# Patient Record
Sex: Female | Born: 1940 | Race: White | Hispanic: No | Marital: Married | Smoking: Former smoker
Health system: Southern US, Community
[De-identification: ages and names within clinical notes are randomized; demographics above are authoritative.]

## PROBLEM LIST (undated history)

## (undated) DIAGNOSIS — D649 Anemia, unspecified: Secondary | ICD-10-CM

## (undated) DIAGNOSIS — E079 Disorder of thyroid, unspecified: Secondary | ICD-10-CM

## (undated) DIAGNOSIS — D638 Anemia in other chronic diseases classified elsewhere: Secondary | ICD-10-CM

## (undated) DIAGNOSIS — T7840XA Allergy, unspecified, initial encounter: Secondary | ICD-10-CM

## (undated) DIAGNOSIS — F0390 Unspecified dementia without behavioral disturbance: Secondary | ICD-10-CM

## (undated) DIAGNOSIS — I639 Cerebral infarction, unspecified: Secondary | ICD-10-CM

## (undated) DIAGNOSIS — K219 Gastro-esophageal reflux disease without esophagitis: Secondary | ICD-10-CM

## (undated) DIAGNOSIS — E871 Hypo-osmolality and hyponatremia: Secondary | ICD-10-CM

## (undated) DIAGNOSIS — I70209 Unspecified atherosclerosis of native arteries of extremities, unspecified extremity: Secondary | ICD-10-CM

## (undated) DIAGNOSIS — I10 Essential (primary) hypertension: Secondary | ICD-10-CM

## (undated) DIAGNOSIS — F0151 Vascular dementia with behavioral disturbance: Secondary | ICD-10-CM

## (undated) DIAGNOSIS — F319 Bipolar disorder, unspecified: Secondary | ICD-10-CM

## (undated) DIAGNOSIS — L98499 Non-pressure chronic ulcer of skin of other sites with unspecified severity: Secondary | ICD-10-CM

## (undated) HISTORY — DX: Cerebral infarction, unspecified: I63.9

## (undated) HISTORY — DX: Hypo-osmolality and hyponatremia: E87.1

## (undated) HISTORY — DX: Vascular dementia with behavioral disturbance: F01.51

## (undated) HISTORY — DX: Bipolar disorder, unspecified: F31.9

## (undated) HISTORY — DX: Allergy, unspecified, initial encounter: T78.40XA

## (undated) HISTORY — PX: FRACTURE SURGERY: SHX138

## (undated) HISTORY — DX: Non-pressure chronic ulcer of skin of other sites with unspecified severity: L98.499

## (undated) HISTORY — DX: Disorder of thyroid, unspecified: E07.9

## (undated) HISTORY — PX: APPENDECTOMY: SHX54

## (undated) HISTORY — DX: Gastro-esophageal reflux disease without esophagitis: K21.9

## (undated) HISTORY — DX: Unspecified dementia, unspecified severity, without behavioral disturbance, psychotic disturbance, mood disturbance, and anxiety: F03.90

## (undated) HISTORY — DX: Anemia, unspecified: D64.9

## (undated) HISTORY — DX: Unspecified atherosclerosis of native arteries of extremities, unspecified extremity: I70.209

## (undated) HISTORY — DX: Anemia in other chronic diseases classified elsewhere: D63.8

---

## 2000-08-08 ENCOUNTER — Emergency Department (HOSPITAL_COMMUNITY): Admission: EM | Admit: 2000-08-08 | Discharge: 2000-08-08 | Payer: Self-pay | Admitting: *Deleted

## 2001-03-23 ENCOUNTER — Encounter: Payer: Self-pay | Admitting: Internal Medicine

## 2001-03-23 ENCOUNTER — Inpatient Hospital Stay (HOSPITAL_COMMUNITY): Admission: AD | Admit: 2001-03-23 | Discharge: 2001-03-31 | Payer: Self-pay | Admitting: Internal Medicine

## 2001-03-24 ENCOUNTER — Encounter: Payer: Self-pay | Admitting: Internal Medicine

## 2001-03-25 ENCOUNTER — Encounter: Payer: Self-pay | Admitting: Internal Medicine

## 2001-03-28 ENCOUNTER — Encounter: Payer: Self-pay | Admitting: Internal Medicine

## 2001-03-29 ENCOUNTER — Encounter: Payer: Self-pay | Admitting: Internal Medicine

## 2001-03-31 ENCOUNTER — Encounter (INDEPENDENT_AMBULATORY_CARE_PROVIDER_SITE_OTHER): Payer: Self-pay | Admitting: Internal Medicine

## 2002-02-22 ENCOUNTER — Inpatient Hospital Stay (HOSPITAL_COMMUNITY): Admission: EM | Admit: 2002-02-22 | Discharge: 2002-03-06 | Payer: Self-pay | Admitting: Psychiatry

## 2002-07-21 ENCOUNTER — Inpatient Hospital Stay (HOSPITAL_COMMUNITY): Admission: EM | Admit: 2002-07-21 | Discharge: 2002-08-02 | Payer: Self-pay | Admitting: Psychiatry

## 2002-12-08 ENCOUNTER — Inpatient Hospital Stay (HOSPITAL_COMMUNITY): Admission: EM | Admit: 2002-12-08 | Discharge: 2002-12-18 | Payer: Self-pay | Admitting: Psychiatry

## 2002-12-26 ENCOUNTER — Inpatient Hospital Stay (HOSPITAL_COMMUNITY): Admission: AD | Admit: 2002-12-26 | Discharge: 2003-01-04 | Payer: Self-pay | Admitting: Psychiatry

## 2003-03-10 ENCOUNTER — Inpatient Hospital Stay (HOSPITAL_COMMUNITY): Admission: EM | Admit: 2003-03-10 | Discharge: 2003-03-21 | Payer: Self-pay | Admitting: Psychiatry

## 2003-05-10 ENCOUNTER — Inpatient Hospital Stay (HOSPITAL_COMMUNITY): Admission: EM | Admit: 2003-05-10 | Discharge: 2003-05-21 | Payer: Self-pay | Admitting: Psychiatry

## 2003-06-18 ENCOUNTER — Emergency Department (HOSPITAL_COMMUNITY): Admission: EM | Admit: 2003-06-18 | Discharge: 2003-06-19 | Payer: Self-pay | Admitting: *Deleted

## 2003-08-18 ENCOUNTER — Inpatient Hospital Stay (HOSPITAL_COMMUNITY): Admission: EM | Admit: 2003-08-18 | Discharge: 2003-08-27 | Payer: Self-pay | Admitting: *Deleted

## 2003-09-25 ENCOUNTER — Emergency Department (HOSPITAL_COMMUNITY): Admission: EM | Admit: 2003-09-25 | Discharge: 2003-09-26 | Payer: Self-pay | Admitting: Emergency Medicine

## 2004-10-21 ENCOUNTER — Ambulatory Visit: Payer: Self-pay | Admitting: Family Medicine

## 2004-12-18 ENCOUNTER — Ambulatory Visit: Payer: Self-pay | Admitting: Family Medicine

## 2004-12-19 ENCOUNTER — Ambulatory Visit (HOSPITAL_COMMUNITY): Admission: RE | Admit: 2004-12-19 | Discharge: 2004-12-19 | Payer: Self-pay | Admitting: Family Medicine

## 2005-01-27 ENCOUNTER — Emergency Department (HOSPITAL_COMMUNITY): Admission: EM | Admit: 2005-01-27 | Discharge: 2005-01-27 | Payer: Self-pay | Admitting: Emergency Medicine

## 2005-04-08 ENCOUNTER — Ambulatory Visit: Payer: Self-pay | Admitting: Family Medicine

## 2005-04-22 ENCOUNTER — Ambulatory Visit: Payer: Self-pay | Admitting: Gastroenterology

## 2005-05-01 ENCOUNTER — Ambulatory Visit (HOSPITAL_COMMUNITY): Admission: RE | Admit: 2005-05-01 | Discharge: 2005-05-01 | Payer: Self-pay | Admitting: Internal Medicine

## 2005-05-01 ENCOUNTER — Ambulatory Visit: Payer: Self-pay | Admitting: Internal Medicine

## 2005-05-04 ENCOUNTER — Ambulatory Visit: Payer: Self-pay | Admitting: Family Medicine

## 2005-05-04 ENCOUNTER — Encounter (HOSPITAL_COMMUNITY): Admission: RE | Admit: 2005-05-04 | Discharge: 2005-05-04 | Payer: Self-pay | Admitting: Internal Medicine

## 2005-05-07 ENCOUNTER — Ambulatory Visit: Payer: Self-pay | Admitting: Family Medicine

## 2005-05-22 ENCOUNTER — Ambulatory Visit: Payer: Self-pay | Admitting: Family Medicine

## 2005-06-05 ENCOUNTER — Ambulatory Visit: Payer: Self-pay | Admitting: Family Medicine

## 2005-06-05 ENCOUNTER — Inpatient Hospital Stay (HOSPITAL_COMMUNITY): Admission: EM | Admit: 2005-06-05 | Discharge: 2005-06-09 | Payer: Self-pay | Admitting: Emergency Medicine

## 2005-06-09 ENCOUNTER — Inpatient Hospital Stay (HOSPITAL_COMMUNITY): Admission: AD | Admit: 2005-06-09 | Discharge: 2005-06-24 | Payer: Self-pay | Admitting: Psychiatry

## 2005-06-10 ENCOUNTER — Ambulatory Visit: Payer: Self-pay | Admitting: Psychiatry

## 2005-06-18 ENCOUNTER — Ambulatory Visit: Payer: Self-pay | Admitting: Psychiatry

## 2005-07-24 ENCOUNTER — Ambulatory Visit: Payer: Self-pay | Admitting: Family Medicine

## 2006-07-05 ENCOUNTER — Ambulatory Visit: Payer: Self-pay | Admitting: Family Medicine

## 2006-08-30 ENCOUNTER — Ambulatory Visit: Payer: Self-pay | Admitting: Family Medicine

## 2006-09-16 ENCOUNTER — Ambulatory Visit: Payer: Self-pay | Admitting: Family Medicine

## 2006-10-22 ENCOUNTER — Ambulatory Visit: Payer: Self-pay | Admitting: Family Medicine

## 2007-03-30 ENCOUNTER — Inpatient Hospital Stay: Payer: Self-pay | Admitting: Unknown Physician Specialty

## 2007-09-08 ENCOUNTER — Ambulatory Visit (HOSPITAL_COMMUNITY): Admission: RE | Admit: 2007-09-08 | Discharge: 2007-09-08 | Payer: Self-pay | Admitting: Family Medicine

## 2008-10-11 ENCOUNTER — Emergency Department (HOSPITAL_COMMUNITY): Admission: EM | Admit: 2008-10-11 | Discharge: 2008-10-12 | Payer: Self-pay | Admitting: Emergency Medicine

## 2008-11-01 ENCOUNTER — Inpatient Hospital Stay (HOSPITAL_COMMUNITY): Admission: EM | Admit: 2008-11-01 | Discharge: 2008-11-07 | Payer: Self-pay | Admitting: Emergency Medicine

## 2008-11-03 ENCOUNTER — Ambulatory Visit: Payer: Self-pay | Admitting: Psychiatry

## 2010-01-30 IMAGING — CT CT ANGIO CHEST
3 of 9 series · 18 of 46 positions shown · IV contrast (APPLIED)
Comparison: No prior CT chest.  Two-view chest x-ray yesterday.

CLINICAL DATA: Shortness of breath.  Left lower lobe atelectasis
versus bronchopneumonia on chest x-ray yesterday.  Former smoker.

CT ANGIOGRAPHY CHEST WITH CONTRAST 11/02/2008:
TECHNIQUE: Multidetector CT imaging of the chest was performed
using the standard protocol during bolus administration of
intravenous contrast. Multiplanar CT image reconstructions
including MIPs were obtained to evaluate the vascular anatomy.
Contrast: 80 ml Qmnipaque-QQQ IV.

[Series 5: pe @ 3mm st · axial · 0.91mm/px · z∈[-250,-40]mm · 12 of 84 slices shown]
[im 7/84  lung]
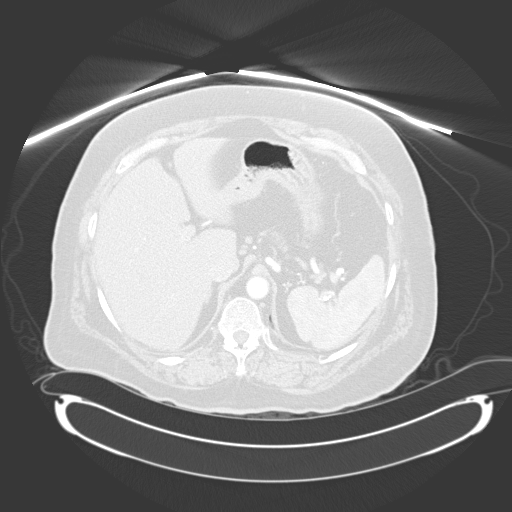
[im 13/84  soft-tissue]
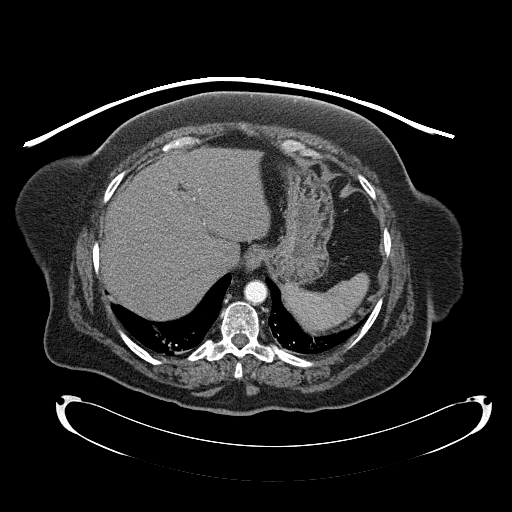
[im 20/84  lung]
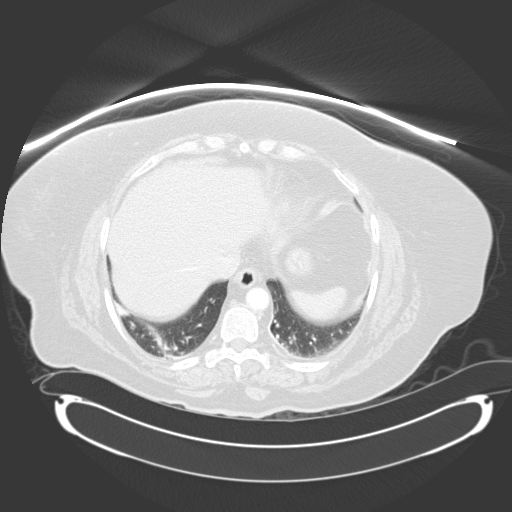
[im 26/84  soft-tissue]
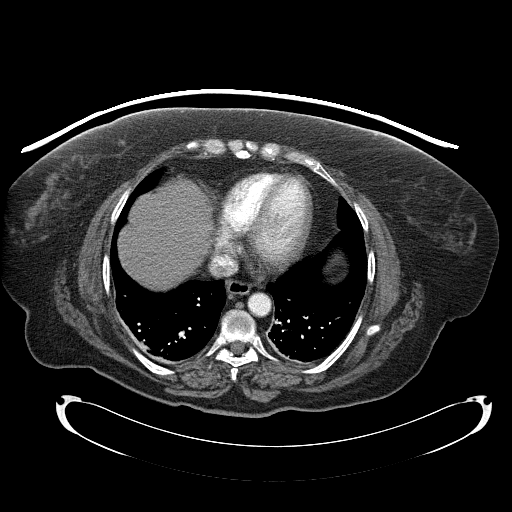
[im 32/84  lung]
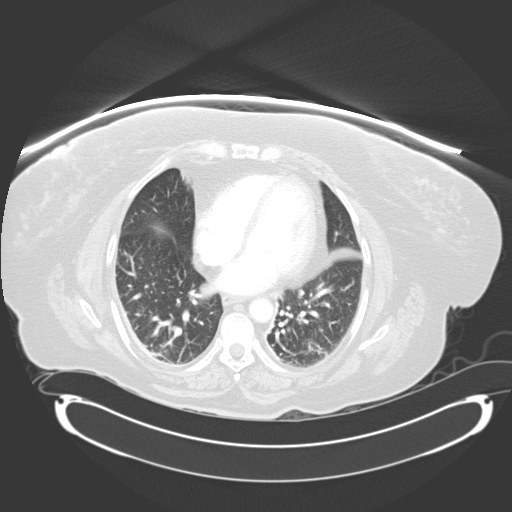
[im 39/84  soft-tissue]
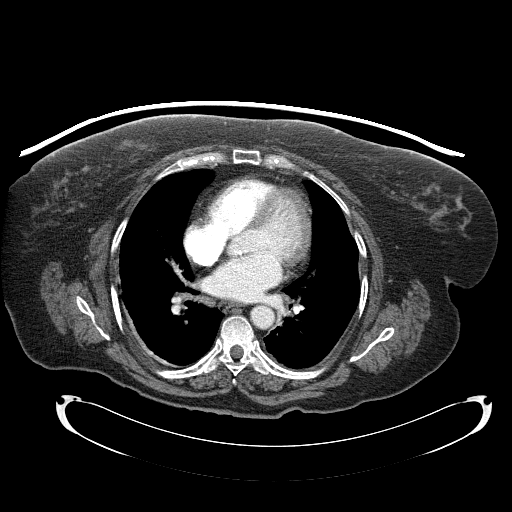
[im 45/84  lung]
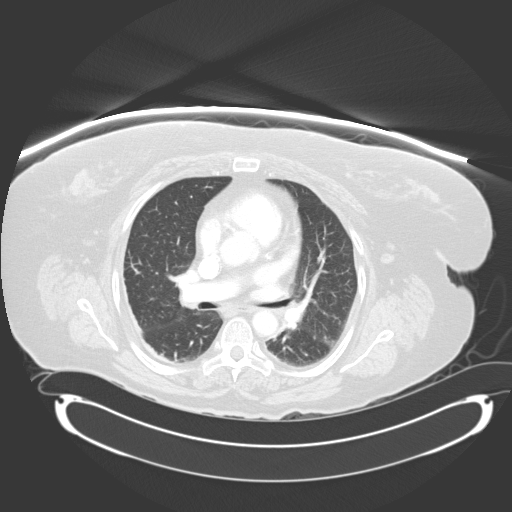
[im 52/84  soft-tissue]
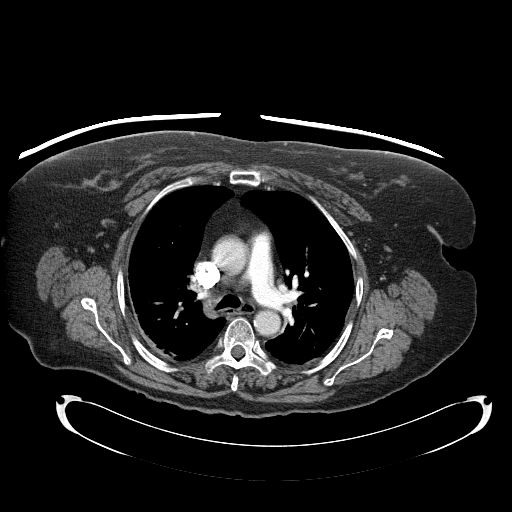
[im 58/84  lung]
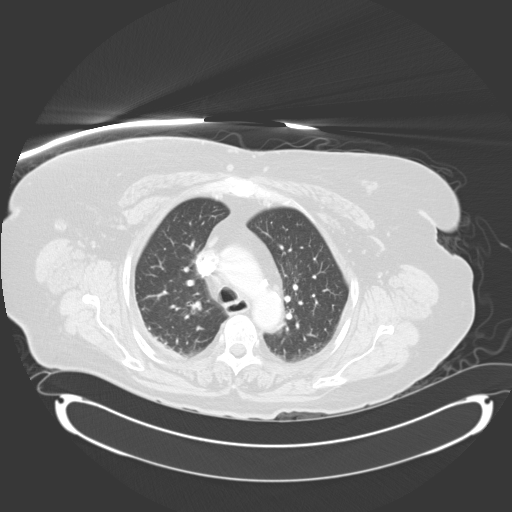
[im 64/84  soft-tissue]
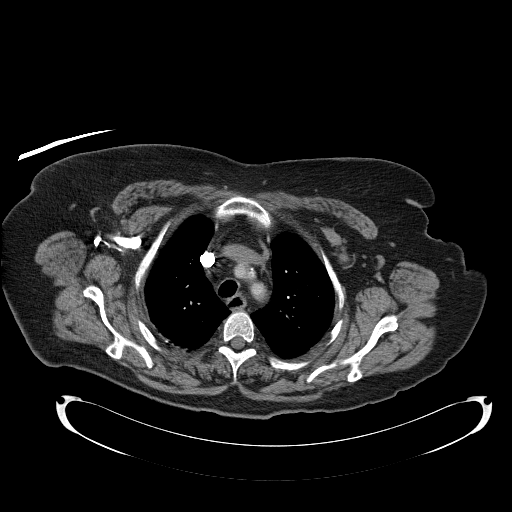
[im 71/84  lung]
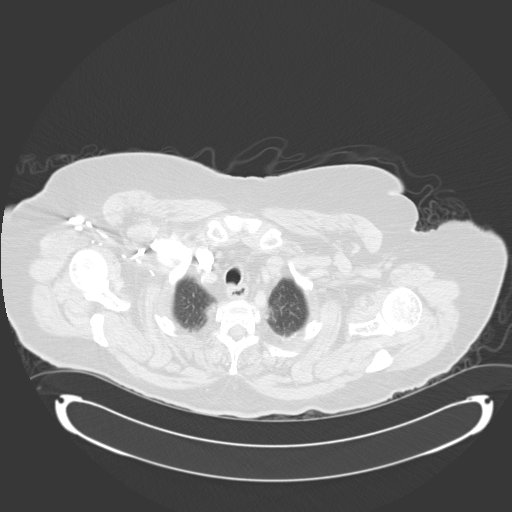
[im 77/84  soft-tissue]
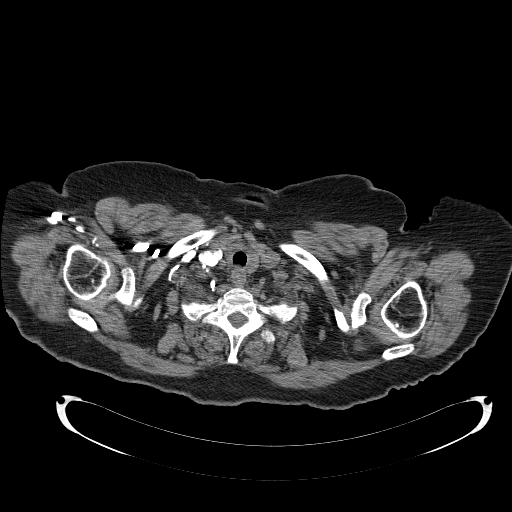

[Series 8: pe lung windows · axial · 0.91mm/px · z∈[-250,-178]mm · 3 of 83 slices shown]
[im 6/83  soft-tissue]
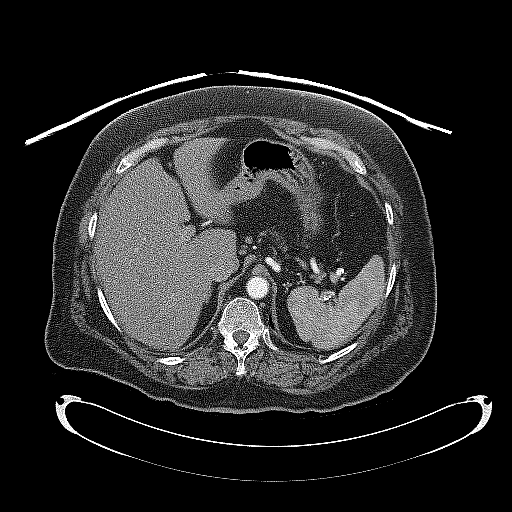
[im 18/83  soft-tissue]
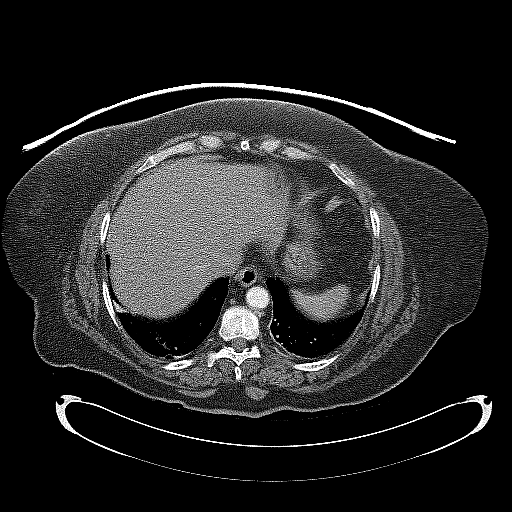
[im 30/83  soft-tissue]
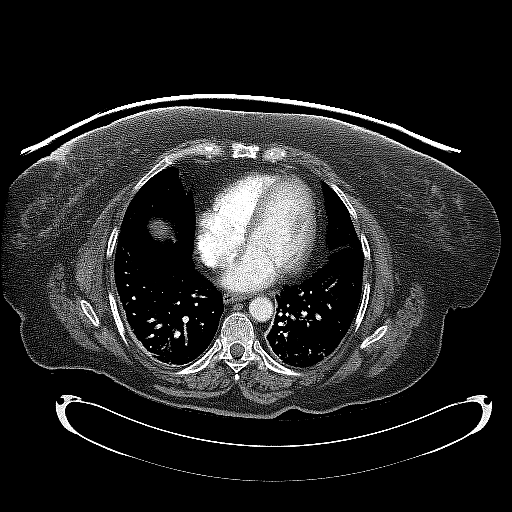

[Series 602: coronal mpr · coronal · 0.91mm/px · 3 of 134 slices shown]
[im 34/134  soft-tissue]
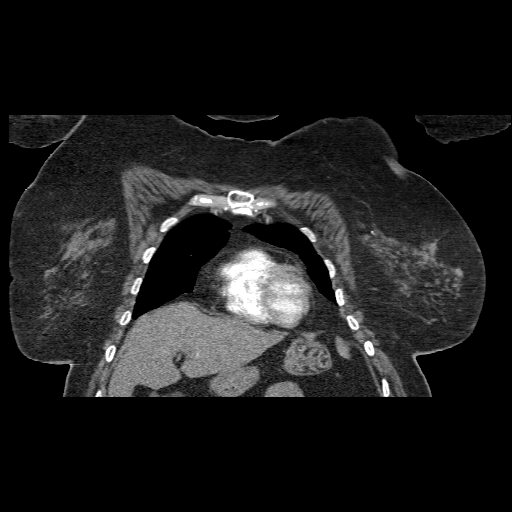
[im 67/134  soft-tissue]
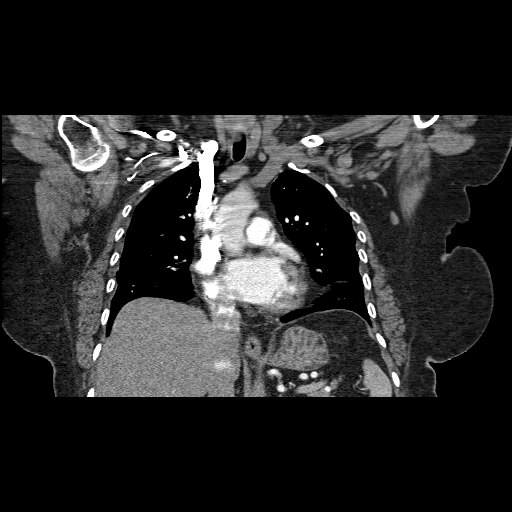
[im 100/134  soft-tissue]
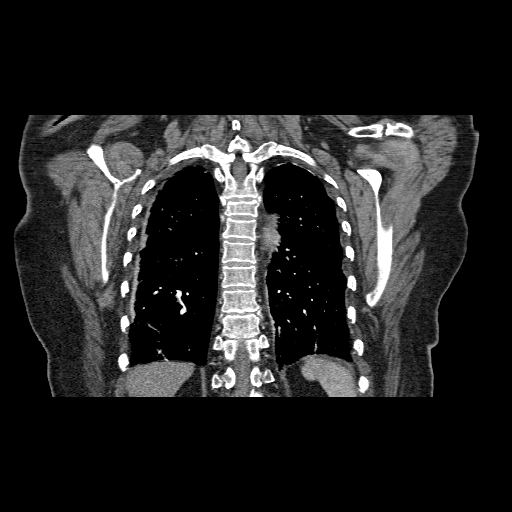

[18 of 46 positions shown; findings below may reference images not displayed]

FINDINGS: Contrast opacification of pulmonary arteries is good.
Patient respiratory motion blurred some of the images of the bases.
Overall, the study is of good diagnostic quality.

No filling defects within either main pulmonary artery or their
branches in either lung to suggest pulmonary embolism.  Heart size
upper normal to perhaps slightly enlarged.  Mild three-vessel
coronary artery calcification.  Mild aortic valvular calcification.
No pericardial effusion.  Mild thoracic and upper abdominal aortic
atherosclerosis.

Scattered normal-sized mediastinal and bilateral hilar lymph nodes,
including intersegmental nodes.  No significant lymphadenopathy in
the chest.  Visualized thyroid gland unremarkable.

Mild atelectasis in the lower lobes.  Lungs otherwise clear.  No
localized airspace consolidation.  No nodules or masses.  No
pleural effusions.

Bone window images demonstrate a compression fracture of T5 on the
order of 50% or so which does not appear acute.  Gallbladder
surgically absent.  Visualized upper abdomen unremarkable for the
early arterial phase of enhancement.

Review of the MIP images confirms the above findings.
IMPRESSION: 1.  No evidence of pulmonary embolism.
2.  Mild cardiomegaly.  Mild three-vessel coronary artery
calcification and mild aortic valvular calcification.
3.  Mild bilateral lower lobe atelectasis.  No acute
cardiopulmonary disease otherwise.

## 2010-08-25 LAB — CBC
HCT: 33.4 % — ABNORMAL LOW (ref 36.0–46.0)
HCT: 34.2 % — ABNORMAL LOW (ref 36.0–46.0)
HCT: 36.6 % (ref 36.0–46.0)
Hemoglobin: 10.9 g/dL — ABNORMAL LOW (ref 12.0–15.0)
Hemoglobin: 11.1 g/dL — ABNORMAL LOW (ref 12.0–15.0)
Hemoglobin: 12 g/dL (ref 12.0–15.0)
MCHC: 32.6 g/dL (ref 30.0–36.0)
MCHC: 32.6 g/dL (ref 30.0–36.0)
MCHC: 32.9 g/dL (ref 30.0–36.0)
MCV: 80 fL (ref 78.0–100.0)
MCV: 80.5 fL (ref 78.0–100.0)
MCV: 80.6 fL (ref 78.0–100.0)
Platelets: 243 K/uL (ref 150–400)
Platelets: 245 K/uL (ref 150–400)
Platelets: 275 K/uL (ref 150–400)
RBC: 4.15 MIL/uL (ref 3.87–5.11)
RBC: 4.24 MIL/uL (ref 3.87–5.11)
RBC: 4.57 MIL/uL (ref 3.87–5.11)
RDW: 14.1 % (ref 11.5–15.5)
RDW: 14.2 % (ref 11.5–15.5)
RDW: 14.5 % (ref 11.5–15.5)
WBC: 5.9 K/uL (ref 4.0–10.5)
WBC: 6.9 K/uL (ref 4.0–10.5)
WBC: 7.1 K/uL (ref 4.0–10.5)

## 2010-08-25 LAB — COMPREHENSIVE METABOLIC PANEL WITH GFR
ALT: 10 U/L (ref 0–35)
AST: 18 U/L (ref 0–37)
Albumin: 3.6 g/dL (ref 3.5–5.2)
Alkaline Phosphatase: 111 U/L (ref 39–117)
BUN: 7 mg/dL (ref 6–23)
CO2: 30 meq/L (ref 19–32)
Calcium: 9.1 mg/dL (ref 8.4–10.5)
Chloride: 100 meq/L (ref 96–112)
Creatinine, Ser: 1.09 mg/dL (ref 0.4–1.2)
GFR calc non Af Amer: 50 mL/min — ABNORMAL LOW
Glucose, Bld: 85 mg/dL (ref 70–99)
Potassium: 3.5 meq/L (ref 3.5–5.1)
Sodium: 138 meq/L (ref 135–145)
Total Bilirubin: 0.5 mg/dL (ref 0.3–1.2)
Total Protein: 7.6 g/dL (ref 6.0–8.3)

## 2010-08-25 LAB — URINALYSIS, ROUTINE W REFLEX MICROSCOPIC
Bilirubin Urine: NEGATIVE
Glucose, UA: NEGATIVE mg/dL
Hgb urine dipstick: NEGATIVE
Ketones, ur: NEGATIVE mg/dL
Nitrite: NEGATIVE
Protein, ur: NEGATIVE mg/dL
Specific Gravity, Urine: 1.012 (ref 1.005–1.030)
Urobilinogen, UA: 0.2 mg/dL (ref 0.0–1.0)
pH: 5.5 (ref 5.0–8.0)

## 2010-08-25 LAB — BASIC METABOLIC PANEL WITH GFR
BUN: 7 mg/dL (ref 6–23)
CO2: 30 meq/L (ref 19–32)
Calcium: 8.5 mg/dL (ref 8.4–10.5)
Chloride: 103 meq/L (ref 96–112)
Creatinine, Ser: 1.01 mg/dL (ref 0.4–1.2)
GFR calc non Af Amer: 55 mL/min — ABNORMAL LOW
Glucose, Bld: 105 mg/dL — ABNORMAL HIGH (ref 70–99)
Potassium: 3.5 meq/L (ref 3.5–5.1)
Sodium: 138 meq/L (ref 135–145)

## 2010-08-25 LAB — CARDIAC PANEL(CRET KIN+CKTOT+MB+TROPI)
CK, MB: 3.2 ng/mL (ref 0.3–4.0)
CK, MB: 3.8 ng/mL (ref 0.3–4.0)
Relative Index: 3.2 — ABNORMAL HIGH (ref 0.0–2.5)
Relative Index: 3.3 — ABNORMAL HIGH (ref 0.0–2.5)
Total CK: 100 U/L (ref 7–177)
Total CK: 115 U/L (ref 7–177)
Troponin I: 0.01 ng/mL (ref 0.00–0.06)

## 2010-08-25 LAB — BASIC METABOLIC PANEL
BUN: 12 mg/dL (ref 6–23)
Calcium: 8.4 mg/dL (ref 8.4–10.5)
Chloride: 100 mEq/L (ref 96–112)
GFR calc Af Amer: 56 mL/min — ABNORMAL LOW (ref >60)
Potassium: 4 mEq/L (ref 3.5–5.1)

## 2010-08-25 LAB — DIFFERENTIAL
Basophils Absolute: 0 K/uL (ref 0.0–0.1)
Basophils Relative: 0 % (ref 0–1)
Eosinophils Absolute: 0 K/uL (ref 0.0–0.7)
Eosinophils Relative: 1 % (ref 0–5)
Lymphocytes Relative: 29 % (ref 12–46)
Lymphs Abs: 2.1 K/uL (ref 0.7–4.0)
Monocytes Absolute: 0.6 K/uL (ref 0.1–1.0)
Monocytes Relative: 8 % (ref 3–12)
Neutro Abs: 4.4 K/uL (ref 1.7–7.7)
Neutrophils Relative %: 62 % (ref 43–77)

## 2010-08-25 LAB — URINE CULTURE
Colony Count: NO GROWTH
Culture: NO GROWTH

## 2010-08-25 LAB — BLOOD GAS, ARTERIAL
Acid-Base Excess: 2.7 mmol/L — ABNORMAL HIGH (ref 0.0–2.0)
Bicarbonate: 26.2 meq/L — ABNORMAL HIGH (ref 20.0–24.0)
Drawn by: 309681
O2 Content: 2 L/min
O2 Saturation: 98.6 %
Patient temperature: 98.6
TCO2: 24 mmol/L (ref 0–100)
pCO2 arterial: 38.1 mmHg (ref 35.0–45.0)
pH, Arterial: 7.452 — ABNORMAL HIGH (ref 7.350–7.400)
pO2, Arterial: 104 mmHg — ABNORMAL HIGH (ref 80.0–100.0)

## 2010-08-25 LAB — MAGNESIUM: Magnesium: 2.2 mg/dL (ref 1.5–2.5)

## 2010-08-26 LAB — CBC
Hemoglobin: 12.2 g/dL (ref 12.0–15.0)
MCHC: 34 g/dL (ref 30.0–36.0)
MCV: 80.1 fL (ref 78.0–100.0)
Platelets: 299 10*3/uL (ref 150–400)
RDW: 14.3 % (ref 11.5–15.5)
WBC: 7.1 10*3/uL (ref 4.0–10.5)

## 2010-08-26 LAB — DIFFERENTIAL
Basophils Absolute: 0 10*3/uL (ref 0.0–0.1)
Basophils Relative: 1 % (ref 0–1)
Eosinophils Relative: 0 % (ref 0–5)
Lymphs Abs: 2.5 10*3/uL (ref 0.7–4.0)
Monocytes Relative: 8 % (ref 3–12)
Neutrophils Relative %: 56 % (ref 43–77)

## 2010-08-26 LAB — BASIC METABOLIC PANEL
GFR calc Af Amer: >60 mL/min (ref >60)
Glucose, Bld: 103 mg/dL — ABNORMAL HIGH (ref 70–99)

## 2010-08-26 LAB — ETHANOL: Alcohol, Ethyl (B): <5 mg/dL (ref 0–10)

## 2010-09-19 ENCOUNTER — Inpatient Hospital Stay (HOSPITAL_COMMUNITY)
Admission: EM | Admit: 2010-09-19 | Discharge: 2010-10-01 | DRG: 339 | Disposition: A | Discharge status: Skilled Nursing Facility | Payer: Medicare Other | Attending: General Surgery | Admitting: General Surgery

## 2010-09-19 ENCOUNTER — Emergency Department (HOSPITAL_COMMUNITY): Payer: Medicare Other

## 2010-09-19 DIAGNOSIS — Z8614 Personal history of Methicillin resistant Staphylococcus aureus infection: Secondary | ICD-10-CM

## 2010-09-19 DIAGNOSIS — K560 Paralytic ileus: Secondary | ICD-10-CM | POA: Diagnosis not present

## 2010-09-19 DIAGNOSIS — F259 Schizoaffective disorder, unspecified: Secondary | ICD-10-CM | POA: Diagnosis present

## 2010-09-19 DIAGNOSIS — K3533 Acute appendicitis with perforation and localized peritonitis, with abscess: Principal | ICD-10-CM | POA: Diagnosis present

## 2010-09-19 DIAGNOSIS — I10 Essential (primary) hypertension: Secondary | ICD-10-CM | POA: Diagnosis present

## 2010-09-19 DIAGNOSIS — K929 Disease of digestive system, unspecified: Secondary | ICD-10-CM | POA: Diagnosis not present

## 2010-09-19 DIAGNOSIS — F0390 Unspecified dementia without behavioral disturbance: Secondary | ICD-10-CM | POA: Diagnosis present

## 2010-09-19 DIAGNOSIS — D696 Thrombocytopenia, unspecified: Secondary | ICD-10-CM | POA: Diagnosis present

## 2010-09-19 DIAGNOSIS — Z8673 Personal history of transient ischemic attack (TIA), and cerebral infarction without residual deficits: Secondary | ICD-10-CM

## 2010-09-19 DIAGNOSIS — E039 Hypothyroidism, unspecified: Secondary | ICD-10-CM | POA: Diagnosis present

## 2010-09-19 DIAGNOSIS — F319 Bipolar disorder, unspecified: Secondary | ICD-10-CM | POA: Diagnosis present

## 2010-09-19 DIAGNOSIS — E871 Hypo-osmolality and hyponatremia: Secondary | ICD-10-CM | POA: Diagnosis not present

## 2010-09-19 DIAGNOSIS — Y838 Other surgical procedures as the cause of abnormal reaction of the patient, or of later complication, without mention of misadventure at the time of the procedure: Secondary | ICD-10-CM | POA: Diagnosis not present

## 2010-09-19 DIAGNOSIS — R5381 Other malaise: Secondary | ICD-10-CM | POA: Diagnosis present

## 2010-09-19 LAB — DIFFERENTIAL
Basophils Absolute: 0 10*3/uL (ref 0.0–0.1)
Basophils Relative: 0 % (ref 0–1)
Lymphocytes Relative: 7 % — ABNORMAL LOW (ref 12–46)
Neutro Abs: 10.1 10*3/uL — ABNORMAL HIGH (ref 1.7–7.7)
Neutrophils Relative %: 86 % — ABNORMAL HIGH (ref 43–77)

## 2010-09-19 LAB — HEPATIC FUNCTION PANEL
AST: 13 U/L (ref 0–37)
Albumin: 3.2 g/dL — ABNORMAL LOW (ref 3.5–5.2)
Alkaline Phosphatase: 105 U/L (ref 39–117)
Total Bilirubin: 0.5 mg/dL (ref 0.3–1.2)
Total Protein: 6.6 g/dL (ref 6.0–8.3)

## 2010-09-19 LAB — POCT I-STAT, CHEM 8
HCT: 42 % (ref 36.0–46.0)
Hemoglobin: 14.3 g/dL (ref 12.0–15.0)
Potassium: 3.6 mEq/L (ref 3.5–5.1)
Sodium: 134 mEq/L — ABNORMAL LOW (ref 135–145)
TCO2: 24 mmol/L (ref 0–100)

## 2010-09-19 LAB — CBC
HCT: 40 % (ref 36.0–46.0)
Hemoglobin: 14.1 g/dL (ref 12.0–15.0)
WBC: 11.8 10*3/uL — ABNORMAL HIGH (ref 4.0–10.5)

## 2010-09-19 LAB — URINALYSIS, ROUTINE W REFLEX MICROSCOPIC
Bilirubin Urine: NEGATIVE
Glucose, UA: NEGATIVE mg/dL
Ketones, ur: 40 mg/dL — AB
Nitrite: NEGATIVE
Specific Gravity, Urine: 1.024 (ref 1.005–1.030)
pH: 7 (ref 5.0–8.0)

## 2010-09-19 LAB — URINE MICROSCOPIC-ADD ON

## 2010-09-19 LAB — LIPASE, BLOOD: Lipase: 18 U/L (ref 11–59)

## 2010-09-19 MED ORDER — IOHEXOL 300 MG/ML  SOLN
100.0000 mL | Freq: Once | INTRAMUSCULAR | Status: AC | PRN
  Administered 2010-09-19: 100 mL via INTRAVENOUS

## 2010-09-20 LAB — BASIC METABOLIC PANEL
BUN: 14 mg/dL (ref 6–23)
Calcium: 9.3 mg/dL (ref 8.4–10.5)
Creatinine, Ser: 0.75 mg/dL (ref 0.4–1.2)
GFR calc Af Amer: >60 mL/min (ref >60)
GFR calc non Af Amer: >60 mL/min (ref >60)

## 2010-09-20 LAB — CBC
MCH: 30.3 pg (ref 26.0–34.0)
MCHC: 34.4 g/dL (ref 30.0–36.0)
MCV: 88.1 fL (ref 78.0–100.0)
Platelets: 96 10*3/uL — ABNORMAL LOW (ref 150–400)
RBC: 4.36 MIL/uL (ref 3.87–5.11)
RDW: 13 % (ref 11.5–15.5)

## 2010-09-21 ENCOUNTER — Inpatient Hospital Stay (HOSPITAL_COMMUNITY): Payer: Medicare Other

## 2010-09-21 LAB — COMPREHENSIVE METABOLIC PANEL
ALT: 5 U/L (ref 0–35)
AST: 9 U/L (ref 0–37)
Albumin: 2.5 g/dL — ABNORMAL LOW (ref 3.5–5.2)
Chloride: 97 mEq/L (ref 96–112)
Creatinine, Ser: 0.84 mg/dL (ref 0.4–1.2)
GFR calc Af Amer: >60 mL/min (ref >60)
Sodium: 131 mEq/L — ABNORMAL LOW (ref 135–145)
Total Bilirubin: 0.3 mg/dL (ref 0.3–1.2)

## 2010-09-21 LAB — DIFFERENTIAL
Basophils Absolute: 0 K/uL (ref 0.0–0.1)
Basophils Relative: 0 % (ref 0–1)
Eosinophils Absolute: 0 K/uL (ref 0.0–0.7)
Eosinophils Relative: 0 % (ref 0–5)
Lymphocytes Relative: 9 % — ABNORMAL LOW (ref 12–46)
Lymphs Abs: 1.3 K/uL (ref 0.7–4.0)
Monocytes Absolute: 1.3 K/uL — ABNORMAL HIGH (ref 0.1–1.0)
Monocytes Relative: 9 % (ref 3–12)
Neutro Abs: 12.3 K/uL — ABNORMAL HIGH (ref 1.7–7.7)
Neutrophils Relative %: 82 % — ABNORMAL HIGH (ref 43–77)

## 2010-09-21 LAB — CBC
Hemoglobin: 13 g/dL (ref 12.0–15.0)
MCH: 30.4 pg (ref 26.0–34.0)
Platelets: 96 10*3/uL — ABNORMAL LOW (ref 150–400)
RBC: 4.27 MIL/uL (ref 3.87–5.11)
WBC: 14.9 10*3/uL — ABNORMAL HIGH (ref 4.0–10.5)

## 2010-09-21 LAB — LACTIC ACID, PLASMA: Lactic Acid, Venous: 0.7 mmol/L (ref 0.5–2.2)

## 2010-09-21 NOTE — Consult Note (Signed)
[name], [name] NO.:  [id number]  MEDICAL RECORD NO.:  [id number]           PATIENT TYPE:  I  LOCATION:  1438                         FACILITY:  [facility]  PHYSICIAN:  Lonia Blood, M.D.      DATE OF BIRTH:  02-05-1941  DATE OF CONSULTATION:  09/19/2010 DATE OF DISCHARGE:                                CONSULTATION   REFERRING PHYSICIAN:  Ardeth Sportsman, MD, General Surgery.  REASON FOR CONSULTATION:  Medical management of hyponatremia, dementia, and hypothyroidism.  BRIEF HISTORY OF PRESENT ILLNESS:  The patient is a 70 year old female who was admitted by General Surgery today secondary to abdominal pain with diagnosis of acute appendicitis.  She is going to undergo surgery as soon as practicable, but she has multiple medical problems that need to be addressed and she needs medical clearance prior to surgery, hence we are consulted.  She is currently having severe pain, moving  outside of bed.  She has mild dementia, so she is not giving [place] any good history.  She, however, is well known to our service.  Her last admission was back in June 2010, at which point she was admitted with generalized hypoxia.  PAST MEDICAL HISTORY: 1. GERD. 2. Hypothyroidism. 3. Bipolar disorder. 4. Dementia. 5. Hypertension. 6. Compression fracture of T4-L1. 7. History of schizoaffective disorder. 8. History of remote CVA. 9. History of hyponatremia. 10.History of bilateral knee cellulitis. 11.History of pneumonia and hiatal hernia.  ALLERGIES: 1. CODEINE. 2. DEMEROL. 3. DILAUDID. 4. FLEXERIL. 5. PENICILLIN.  CURRENT MEDICATIONS: 1. Wellbutrin 150 mg daily. 2. Lasix 40 mg daily. 3. Synthroid 50 mcg daily. 4. Zestril 2.5 mg daily. 5. Prilosec 20 mg daily. 6. Plavix 75 mg daily. 7. Cymbalta 20 mg daily. 8. Oyster calcium with D 500, 20 mg twice a day. 9. Multivitamin daily. 10.Depakote 250 mg t.i.d. 11.Ativan 0.5 mg t.i.d. 12.Aricept 10 mg  q.h.s. 13.Clonidine 0.1 mg p.r.n. 14.Potassium chloride 10 mEq daily. 15.She also has propranolol once a day.  SOCIAL HISTORY:  The patient currently lives in [place], [place]. She denied tobacco, alcohol, or IV drug use.  She has been to nursing facility before.  FAMILY HISTORY:  Mother died of diabetes.  Father had Alzheimer disease.  REVIEW OF SYSTEMS:  All systems reviewed are negative except per HPI.  PHYSICAL EXAMINATION:  VITAL SIGNS:  Temperature 101.0 rectally, blood pressure 133/64, pulse 82, respiratory rate 22, sats 93% room air. GENERAL:  The patient looks drowsy, arousable, confused, but communicating HEENT:  PERRL.  EOMI.  She has dry mucous membranes, but no pallor or jaundice. NECK:  Supple.  No JVD, no lymphadenopathy. RESPIRATORY:  Good air entry bilaterally.  No wheezes or rales. CARDIOVASCULAR:  S1 and S2, no murmur. ABDOMEN:  Distended, full, soft with diffuse tenderness, more in the right lower quadrant, but no guarding.  She has positive bowel sounds. No palpable organomegaly. EXTREMITIES:  No edema, cyanosis, or clubbing. SKIN:  No rashes or ulcers.  LABORATORY DATA:  White count is 11.8, hemoglobin 14.1 with platelet count of 82.  She has left shift, ANC of 10.1.  Her hepatic  function showed albumin of 3.2, otherwise within normal.  Lipase is 18. Urinalysis showed cloudy urine with some ketones, moderate blood, trace leukocyte esterase.  Urine microscopy showed RBCs 7-10 with mucus present and amorphous urates, but no WBC.  RADIOLOGICAL STUDIES:  CT abdomen and pelvis showed dilated appendix with a large amount of periappendiceal or right lower quadrant inflammation, which is compatible with appendicitis.  There are small bowel loops in the right lower quadrant, also thickened and inflamed, probably related to the appendicitis.  There is mild small bowel ileus with free fluid in the pelvis.  ASSESSMENT:  This is a 70 year old female admitted  with acute appendicitis, but had other medical problems including gastroesophageal reflux disease, hypothyroidism, bipolar disorder, hypertension, hyponatremia, dementia, schizoaffective disorder, thrombocytopenia, history of cerebrovascular accident.  Most of these conditions are currently stable.  RECOMMENDATIONS:  Hold Plavix for at least 3 days prior to surgery, no aspirin, continue home medications as much as possible, hydrate the patient, continue PPI, especially, she will need DVT prophylaxis both pre- and postsurgery.  Suggest continuing Lovenox until 12 hours prior to surgery and then discontinue.  We will follow up with you.  Thank you for the consult.     Lonia Blood, M.D.     Verlin Grills  D:  09/20/2010  T:  09/20/2010  Job:  [id number]  Electronically Signed by Lonia Blood M.D. on 09/21/2010 10:14:41 PM

## 2010-09-22 ENCOUNTER — Other Ambulatory Visit: Payer: Self-pay | Admitting: Surgery

## 2010-09-22 ENCOUNTER — Inpatient Hospital Stay (HOSPITAL_COMMUNITY): Payer: Medicare Other

## 2010-09-22 LAB — CBC
HCT: 37.6 % (ref 36.0–46.0)
Hemoglobin: 12.6 g/dL (ref 12.0–15.0)
MCHC: 33.5 g/dL (ref 30.0–36.0)
MCV: 90 fL (ref 78.0–100.0)
WBC: 10.2 10*3/uL (ref 4.0–10.5)

## 2010-09-22 LAB — BASIC METABOLIC PANEL
CO2: 26 mEq/L (ref 19–32)
Glucose, Bld: 101 mg/dL — ABNORMAL HIGH (ref 70–99)
Potassium: 4.1 mEq/L (ref 3.5–5.1)
Sodium: 131 mEq/L — ABNORMAL LOW (ref 135–145)

## 2010-09-22 NOTE — Op Note (Signed)
NAMEJENINA, MOENING NO.:  [id number]  MEDICAL RECORD NO.:  [id number]           PATIENT TYPE:  I  LOCATION:  1438                         FACILITY:  [facility]  PHYSICIAN:  Sandria Bales. Ezzard Standing, M.D.  DATE OF BIRTH:  [birth year]  DATE OF PROCEDURE: 22 Sep 2010                              OPERATIVE REPORT  PREOPERATIVE DIAGNOSIS:  Appendicitis.  POSTOPERATIVE DIAGNOSIS:  Ruptured appendicitis with abscess formation.  PROCEDURE:  Laparoscopic appendectomy.  SURGEON:  Sandria Bales. Ezzard Standing, M.D.  FIRST ASSISTANT:  No first assistant.  ANESTHESIA:  General endotracheal.  ESTIMATED BLOOD LOSS:  Blood loss was minimal.  PROCEDURE IN DETAIL:  Ms. Bastarache is a 70 year old white female who has multiple medical problems including bipolar disorder, schizoaffective disorder, history of compression fractures and remote CVA.  She presented with abdominal pain and underwent a CT scan of her abdomen on May 4 and was found to have a dilated appendix with periappendiceal inflammation consistent with appendicitis.  However, the patient was on Plavix and for this reason was placed on IV antibiotics for 3 days.  She is now ready for surgery.  I spoke with her son, Sabino Dick, by phone.  He is the one who signed her permit.  The indications and potential complications of surgery were also explained to the patient. The potential complications include, but are not limited to, bleeding, open surgery, bowel resection and postoperative further problems with infections.  OPERATIVE NOTE:  The patient was placed in the supine position and the left arm was tucked.  The right arm was out to the side.  She was already on Invanz as her antibiotics.  A time-out was held and the surgical checklist run.  Her abdomen was prepped with ChloraPrep and sterilely draped.  I got into her abdominal cavity through an infraumbilical incision with sharp dissection carried down to the abdominal cavity.  A 12-mm  Hasson trocar was inserted into the abdominal cavity and secured with a 0 Vicryl suture.  I used a 10-mm angled scope and inserted this into the abdominal cavity.  I placed a 5-mm trocar in the right upper quadrant and an 11-mm in the left lower quadrant.    Upon entering the abdominal cavity, she had dilated bowel.  She had a focal abscess in the right lower quadrant with bowel stuck to this, looking a little bit like an early bowel obstruction versus ileus.  I broke up these adhesions.  I did take cultures and sent these for aerobe and anaerobe.  I got 2 or 3 cc of fluid and dripped this into the containers.  I then irrigated the abdomen and did this while I tried to break up these abscesses and getting kind of purulent fluid out.  She did have some of this exudate on her bowel serosal surface.  I then took down the mesentery of the appendix.  Her appendix had ruptured near the base, but I dissected below this.  Because of some local inflammation and thickening, I used a blue load of the 45-mm Ethicon Endo-GIA stapler and fired this across the base of the appendix.  The  appendix was then placed in the EndoCatch bag, delivered through the umbilicus and sent to Pathology.  I then spent about 20 minutes.  I put in over 3 liters of fluid irrigating out first her pelvis and breaking up any adhesions or abscesses in the pelvis along her right quadrant, trying to make sure all of the interloop infections had been broken up.  Her gallbladder seemed to be invested with fat.  I could not see her gallbladder.  Her right and left lobes of the liver were unremarkable.  Her stomach was unremarkable.  The remainder of the bowel that I could see was unremarkable.  The trocars were then removed in turn.  The umbilical trocar site was closed with a 0 Vicryl suture.  The skin at each site was closed with a 5-0 Vicryl suture and painted with Dermabond.    The patient was transferred to the recovery  room in good condition.  Sponge and needle counts were correct at the end of the case.  Sandria Bales. Ezzard Standing, M.D., FACS  DHN/MEDQ  D:  09/22/2010  T:  09/22/2010  Job:  [id number]  cc:   Lonia Blood, M.D.  Electronically Signed by Ovidio Kin M.D. on 09/22/2010 04:09:49 PM

## 2010-09-24 LAB — CBC
Hemoglobin: 12.8 g/dL (ref 12.0–15.0)
MCH: 29.8 pg (ref 26.0–34.0)
MCV: 87.9 fL (ref 78.0–100.0)
Platelets: 159 10*3/uL (ref 150–400)
RBC: 4.29 MIL/uL (ref 3.87–5.11)

## 2010-09-24 LAB — DIFFERENTIAL
Eosinophils Absolute: 0.1 10*3/uL (ref 0.0–0.7)
Lymphs Abs: 2 10*3/uL (ref 0.7–4.0)
Monocytes Absolute: 1.3 10*3/uL — ABNORMAL HIGH (ref 0.1–1.0)
Monocytes Relative: 14 % — ABNORMAL HIGH (ref 3–12)
Neutrophils Relative %: 63 % (ref 43–77)

## 2010-09-25 ENCOUNTER — Inpatient Hospital Stay (HOSPITAL_COMMUNITY): Payer: Medicare Other

## 2010-09-25 LAB — CBC
MCH: 30.6 pg (ref 26.0–34.0)
MCHC: 35.8 g/dL (ref 30.0–36.0)
MCV: 85.5 fL (ref 78.0–100.0)
Platelets: 167 10*3/uL (ref 150–400)
RDW: 12.5 % (ref 11.5–15.5)

## 2010-09-25 LAB — BASIC METABOLIC PANEL
BUN: 4 mg/dL — ABNORMAL LOW (ref 6–23)
CO2: 29 mEq/L (ref 19–32)
Calcium: 8.9 mg/dL (ref 8.4–10.5)
Creatinine, Ser: <0.47 mg/dL (ref 0.4–1.2)
Creatinine, Ser: 0.56 mg/dL (ref 0.4–1.2)
GFR calc Af Amer: >60 mL/min (ref >60)

## 2010-09-25 LAB — DIFFERENTIAL
Eosinophils Absolute: 0 10*3/uL (ref 0.0–0.7)
Eosinophils Relative: 0 % (ref 0–5)
Lymphs Abs: 1.8 10*3/uL (ref 0.7–4.0)
Monocytes Relative: 12 % (ref 3–12)

## 2010-09-26 ENCOUNTER — Inpatient Hospital Stay (HOSPITAL_COMMUNITY): Payer: Medicare Other

## 2010-09-26 LAB — BASIC METABOLIC PANEL
CO2: 28 mEq/L (ref 19–32)
Calcium: 8.3 mg/dL — ABNORMAL LOW (ref 8.4–10.5)
GFR calc Af Amer: >60 mL/min (ref >60)
GFR calc non Af Amer: >60 mL/min (ref >60)
Sodium: 123 mEq/L — ABNORMAL LOW (ref 135–145)

## 2010-09-26 LAB — DIFFERENTIAL
Basophils Absolute: 0 10*3/uL (ref 0.0–0.1)
Basophils Relative: 0 % (ref 0–1)
Monocytes Absolute: 1.4 10*3/uL — ABNORMAL HIGH (ref 0.1–1.0)
Neutro Abs: 6.3 10*3/uL (ref 1.7–7.7)
Neutrophils Relative %: 67 % (ref 43–77)

## 2010-09-26 LAB — CBC
Hemoglobin: 12.4 g/dL (ref 12.0–15.0)
MCHC: 34.3 g/dL (ref 30.0–36.0)
Platelets: 283 10*3/uL (ref 150–400)

## 2010-09-27 ENCOUNTER — Inpatient Hospital Stay (HOSPITAL_COMMUNITY): Payer: Medicare Other

## 2010-09-27 LAB — BASIC METABOLIC PANEL
GFR calc Af Amer: >60 mL/min (ref >60)
GFR calc non Af Amer: >60 mL/min (ref >60)
Glucose, Bld: 93 mg/dL (ref 70–99)
Potassium: 3.7 mEq/L (ref 3.5–5.1)
Sodium: 131 mEq/L — ABNORMAL LOW (ref 135–145)

## 2010-09-27 LAB — ANAEROBIC CULTURE

## 2010-09-28 LAB — BASIC METABOLIC PANEL
Chloride: 100 mEq/L (ref 96–112)
GFR calc Af Amer: >60 mL/min (ref >60)
GFR calc non Af Amer: >60 mL/min (ref >60)
Potassium: 3.6 mEq/L (ref 3.5–5.1)
Sodium: 132 mEq/L — ABNORMAL LOW (ref 135–145)

## 2010-09-29 LAB — BASIC METABOLIC PANEL
CO2: 26 mEq/L (ref 19–32)
Chloride: 97 mEq/L (ref 96–112)
GFR calc Af Amer: >60 mL/min (ref >60)
Sodium: 131 mEq/L — ABNORMAL LOW (ref 135–145)

## 2010-09-30 NOTE — Group Therapy Note (Signed)
[name], [name] NO.:  [id number]   MEDICAL RECORD NO.:  [id number]          PATIENT TYPE:  INP   LOCATION:  1502                         FACILITY:  [facility]   PHYSICIAN:  [name] Scott, MD     DATE OF BIRTH:  1941/01/16                                 PROGRESS NOTE   ADDENDUM:   DATE OF DISCHARGE:  To be determined.   PRIMARY MEDICAL DOCTOR:  Dr. Prescott Parma.   This is an addendum to the discharge summary that was done by this  dictator on November 03, 2008 and will update discharge medications and  discharge diagnoses.   DISCHARGE DIAGNOSES:  1. Atypical chest pain, likely gastric in origin.  Resolved.      Myocardial infarction ruled out.  2. Hypothyroidism, suboptimal supplementation.   DISCHARGE MEDICATIONS:  1. Are the same as per the previous discharge summary as of November 03, 2008.  2. Prilosec increased to 20 mg p.o. b.i.d.  3. Synthroid increased to 100 mcg p.o. daily.  4. Maalox 30 mL p.o. q.6 hourly p.r.n..   HOSPITAL COURSE AND PATIENT DISPOSITION:  On November 03, 2008, Dr. Dub Mikes from psychiatry saw the patient.  As per his  note, no beds were available at the [facility] and he recommended that clinical social worker explore  other placement options.  In discussing with clinical social worker, it  was gathered that the patient stated to Dr. Dub Mikes that she would kill  herself if she were sent back to [facility].  She stated she wanted to go home.  However, the patient's husband has  lung cancer and had one lung removed, and has 2 weekly dressing changes  at home and cannot care for the patient.  She discussed with the  patient, who was in agreement, to go to another skilled nursing facility  without any ideations of committing suicide.  Apparently, this was  discussed with Dr. Dub Mikes, who was agreeable to this plan.  Currently an  assisted-living facility search is in progress.   In  the interim, the patient yesterday complained of atypical chest pain  which was not relieved by sublingual nitroglycerin.  There was no acute  EKG changes.  Cardiac enzymes were negative.  The patient was provided a  dose of Maalox and the chest pain promptly subsided.  This seems to be a  gastric origin chest pain.  Her PPI will be increased to b.i.d..  Consider outpatient evaluation as deemed necessary.   The patient's TSH is also elevated at 9.776 suggesting suboptimal  supplementation.  Her Synthroid was increased to 100 mcg daily.  Recommend repeating thyroid function test in 4 to 6 weeks.   UPDATED LABS:  TSH 9.776, CBCs with hemoglobin 11.1, hematocrit 34, white blood cell  5.9, platelets 245,000.  Creatinine 1.16.      [name] Scott, MD  Electronically Signed     AH/MEDQ  D:  11/06/2008  T:  11/06/2008  Job:  [id number]   cc:   Prescott Parma  Fax: [phone]

## 2010-09-30 NOTE — Discharge Summary (Signed)
NAME:  [name], [name] NO.:  [id number]   MEDICAL RECORD NO.:  [id number]          PATIENT TYPE:  INP   LOCATION:  1502                         FACILITY:  [facility]   PHYSICIAN:  [name] Scott, MD     DATE OF BIRTH:  17-Mar-1941   DATE OF ADMISSION:  11/01/2008  DATE OF DISCHARGE:  11/03/2008                               DISCHARGE SUMMARY   PRIMARY MEDICAL DOCTOR:  Dr. Prescott Parma   DISCHARGE DIAGNOSES:  1. Acute hypoxic respiratory failure, resolved.  2. Anemia, for outpatient evaluation.  3. Hypothyroidism.  4. Bipolar disorder.  5. Gastroesophageal reflux disease.  6. Moderate to severe T4 compression fracture, age indeterminate.  7. L1 compression fracture, age indeterminate.   DISCHARGE MEDICATIONS:  Unchanged from prior to admission except that  her Levaquin has been discontinued.  1. Xanax 1 mg p.o. t.i.d.  2. Aricept 10 mg p.o. q.h.s.  3. Seroquel 50 mg p.o. q.a.m. and h.s.  4. Prilosec 20 mg p.o. daily.  5. Synthroid 75 mcg p.o. daily.  6. Lasix 40 mg p.o. daily.  7. Potassium chloride 10 mEq p.o. daily.  8. Lexapro 20 mg p.o. daily.  9. Plavix 75 mg p.o. daily.  10.Inderal 20 mg p.o. t.i.d.  11.Xanax 0.5 mg p.o. b.i.d. p.r.n. for anxiety.  12.Vicodin 5/500 one p.o. q.6 hourly p.r.n. for moderate to severe      pain.  13.Phenergan 25 mg p.o. q.6 hourly p.r.n. for nausea and vomiting.  14.Tylenol 650 mg p.o. q.4 hourly p.r.n. for mild pain and fever.   PROCEDURES:  1. CT angiogram of the chest.  Impression:      a.     No evidence of pulmonary embolism.      b.     Mild cardiomegaly.  Mild 3-vessel coronary artery       calcification and mild aortic valvular calcification.      c.     Mild bilateral lower lobe atelectasis.  No acute       cardiopulmonary disease.  2. Chest x-ray on November 01, 2008.  Impression:      a.     Mild peri-bronchovascular opacity in the left lower lobe.       Favor atelectasis.  Infection not excluded.      b.      Age-indeterminate, moderate to severe T4 compression       fracture.  Questionable mild age indeterminate L1 compression       fracture.   PERTINENT LABS:  Urine culture no growth.  Magnesium 2.2.  Basic  metabolic panel unremarkable.  BUN 7, creatinine 1.01.  CBC with  hemoglobin 10.9, hematocrit 33.4, white blood cell 6.9, platelets 243.  ABG 7.452, pCO2 38.1, pO2 104, bicarbonate 26.2, oxygen saturation 98.6%  on 2 liters oxygen.  Urinalysis negative.  Hepatic panel within normal  limits.  Blood alcohol level less than 5.   CONSULTATIONS:  Psychiatry, Dr. Dub Mikes, has been called and input is  pending.   HOSPITAL COURSE AND PATIENT DISPOSITION:  [name] is a 70 year old  Caucasian female patient, resident of [facility].  She was transferred from the facility to the [facility] for behavioral and psychiatric evaluation.  Per the  ED physicians, the patient has been very agitated at the facility and is  abusive verbally and physically to staff and hence was sent for  behavioral evaluation.  From gleaning over the e-chart, she has had  multiple psychiatric inpatient admissions.  When the patient reached the  emergency room, she was said to be hypoxic.  However, I do not see a low  oxygen saturation in the ED notes.  In any event, because of low-grade  temperatures and this potential hypoxia and chest x-ray suggestive of  infiltrates, she was admitted for possible pneumonia.  The patient was  empirically started on IV Avelox and all her other home medications were  continued.  Since being in the hospital, the patient has done quite well  and has not been hypoxic and is saturating the low 90s even with  exertion.  She clearly denies any cough or difficulty breathing.  She  indicates sick in the stomach which seems to be a chronic symptom.  She indicates at this time that she is going home.  She denies any  delusions, hallucinations,  suicidal or homicidal ideation.  According to  the nursing staff, she has been out of bed to chair.  She has tolerated  her breakfast and lunch with no vomiting or diarrhea or abdominal pain.  The patient denies any snoring episodes.  It is unclear if the patient  was actually hypoxic in the ED.  If she was hypoxic, then the possible  differentials include acute on chronic bronchitis, atelectasis and rule  out obstructive sleep apnea as an outpatient.  The patient clearly has  no features of congestive heart failure and pulmonary embolism has been  ruled out on CT angiogram of the chest.  At this time, I have called the  [facility] and reminded the psychiatry staff that this  patient needs to be seen.  If the psychiatrist thinks that this patient  needs inpatient psychiatric care, then the patient is medically stable  at this point for transfer to [facility].  However, if the  psychiatrist thinks that the patient can be discharged back to the  assisted living facility, then she is stable for discharge back to  assisted living facility.      [name] Scott, MD  Electronically Signed     AH/MEDQ  D:  11/03/2008  T:  11/03/2008  Job:  [id number]   cc:   [facility]  Fax: [phone]

## 2010-09-30 NOTE — Discharge Summary (Signed)
NAME:  TEDRA, COPPERNOLL NO.:  [id number]   MEDICAL RECORD NO.:  [id number]          PATIENT TYPE:  INP   LOCATION:  1502                         FACILITY:  [facility]   PHYSICIAN:  Eduard Clos, MDDATE OF BIRTH:  February 11, 1941   DATE OF ADMISSION:  11/01/2008  DATE OF DISCHARGE:  11/07/2008                               DISCHARGE SUMMARY   CONTINUATION:  Please refer to the excellent discharge summary dictated  on November 03, 2008, by Dr. Waymon Amato and progress note dictated on November 06, 2008, for the course in hospital and procedures done prior to this  discharge summary.  This discharge summary does emphasize the discharge  medications and the diagnosis, and the followup care necessary.   At this time, the patient is completely asymptomatic, awaiting  placement, and as per the nurse the patient has already acquired a bed.  The patient may be discharged to the assisted living facility today.   FINAL DIAGNOSES:  1. Status post acute respiratory failure.  2. Atypical chest pain likely gastric in origin resolved, myocardial      infarction ruled out.  3. Hypothyroidism.  4. Gastroesophageal reflux disease.  5. Bipolar disorder.  6. Anemia for outpatient workup.  7. Moderate to severe T4 compression, age indeterminate.  8. L1 compression fracture, age indeterminate.   DISCHARGE MEDICATIONS:  1. Xanax 1 mg p.o. t.i.d.  2. Aricept 10 mg p.o. at bedtime.  3. Seroquel 50 mg p.o. in a.m. and at bedtime.  4. Prilosec 40 mg p.o. daily.  5. Synthroid 100 mcg p.o. daily.  6. Lasix 40 mg p.o. daily.  7. Potassium chloride 10 mEq daily.  8. Lexapro 20 mg p.o. daily.  9. Plavix 75 mg p.o. daily.  10.Inderal 20 mg p.o. t.i.d.  11.Xanax 0.5 mg p.o. b.i.d. p.r.n. for anxiety.  12.Vicodin 5/500 one tablet p.o. q.6 h. p.r.n. for moderate to severe      pain.  13.Phenergan 25 mg p.o. q.6 h. p.r.n. for nausea and vomiting.  14.Tylenol 650 mg p.o. q.4 h. p.r.n. for mild to  moderate pain and      fever.   PLAN:  The patient is to be transferred to assisted living.  Activity  will be as tolerated with fall precautions.  Recheck TSH in 4 weeks'  time.  Recheck CBC and BMET within a week's time.  To follow with  primary care physician within a week's time.  The patient has to be on  fall precautions.      Eduard Clos, MD  Electronically Signed     ANK/MEDQ  D:  11/07/2008  T:  11/07/2008  Job:  [id number]

## 2010-09-30 NOTE — H&P (Signed)
NAME:  Brenda, Crane NO.:  [id number]   MEDICAL RECORD NO.:  [id number]          PATIENT TYPE:  INP   LOCATION:  0112                         FACILITY:  [facility]   PHYSICIAN:  Vania Rea, M.D. DATE OF BIRTH:  1940/08/12   DATE OF ADMISSION:  11/01/2008  DATE OF DISCHARGE:                              HISTORY & PHYSICAL   PRIMARY CARE PHYSICIAN:  Dr. Gae Gallop, at [facility].   CHIEF COMPLAINT:  Hypoxia.   HISTORY OF PRESENT ILLNESS:  This is a 70 year old Caucasian lady with a  history of bipolar disorder and behavioral disturbance; who has been a  resident of a skilled nursing facility for one year, and is being  evaluated for transfer to a behavioral health facility because of  abnormal behavior.  The patient was brought to the emergency room today  for medical clearance and was found to be hypoxic; and the hospitalist  service called to assist with management.   The patient within the past few days has been diagnosed with pneumonia  and has been taking Levaquin 500 mg daily for the past 3 days, but has  not required oxygen at the nursing home.   The patient denies cough or chest pains.  She denies fever or shortness  of breath at this time.  She does have episodic abdominal pains.   PAST MEDICAL HISTORY:  1. Bipolar disorder.  2. Hiatal hernia.  3. Hyponatremia.  4. Hypothyroidism.  5. Bilateral knee cellulitis.  6. Recently diagnosed with nosocomial pneumonia.   MEDICATIONS:  1. Xanax 1 mg three times daily, and 0.5 mg twice daily p.r.n.  2. Aricept 10 mg at bedtime.  3. Seroquel 50 mg twice daily.  4. Prilosec 20 mg daily.  5. Synthroid 75 mcg daily.  6. Lasix 40 mg daily.  7. Potassium chloride 10 mEq daily.  8. Lexapro 20 mg daily.  9. Plavix 75 mg daily.  10.Inderal 20 mg three times daily.  11.Levaquin 500 mg daily.  12.Vicodin 5/500 every 6 hours p.r.n.  13.Phenergan 25 mg every 6 hours p.r.n.  14.Tylenol 650 mg every 4  hours p.r.n.   ALLERGIES:  CODEINE, DEMEROL, PENICILLIN, FLEXERIL, and DILAUDID per the  nursing home records.   SOCIAL HISTORY:  The patient says she used to smoke one pack per day  prior to admission to the nursing facility; but for the year she has  been at the nursing facility she has not been smoking.  No history of  alcohol or illicit drug use.   FAMILY HISTORY:  Her mother died of diabetes.  Her father had  Alzheimer's.   REVIEW OF SYSTEMS:  Other than noted above, a 10-point review of systems  the patient complains only of episodic incontinence.   PHYSICAL EXAMINATION:  Obese elderly Caucasian lady, reclining in the  stretcher in no acute distress.  She is alert and oriented x2.  VITALS:  Temperature 98.1, pulse 58, respirations 18, blood pressure  107/50, saturating at 94% on 2 liters.  Her pupils are round and equal.  Mucous membranes pink.  Anicteric.  She  is mildly  dehydrated.  No cervical lymphadenopathy or thyromegaly.  No  jugular venous distention.  CHEST:  She has bibasilar fine crackles.  CARDIOVASCULAR:  Regular rhythm.  No murmur heard.  ABDOMEN:  Obese, soft and nontender.  No masses are felt.  EXTREMITIES:  No pitting edema.  She does have thick legs and coarse  skin.  No ulcerations of the skin noted.  CENTRAL NERVOUS SYSTEM:  Cranial nerves II-XII are grossly intact.  She  has no focal neurologic deficit.   LABS:  CBC is unremarkable, with a white count 7.1, hemoglobin 12.0,  platelets 275.  Complete metabolic panel is completely normal, with a  sodium of 138, potassium 3.5, chloride 100, BUN 7, creatinine 1.09.  Liver function tests normal.  ABG on 2 liters pH 7.45, pCO2 38, pO2 104;  saturating at 98% on 2 liters.  Urinalysis was unremarkable.   A two-view chest x-ray shows mild peri bronchovascular opacity in the  left lower lobe; cannot exclude infection.  A moderate to severe T4  compression fracture.  Questionable mild, age indeterminate, L1   compression fraction.   ASSESSMENT:  1. Acute respiratory failure.  2. Resolving pneumonia, probably contributing to the above.  3. Bipolar disorder, with a history of decompensation.  4. Hypothyroidism.   PLAN:  Will admit this lady for further evaluation of her hypoxia.  Because of her history of tobacco abuse and her abnormal lung findings,  we will go ahead and get a CT scan of her chest; and in fact, will make  it a CT angiogram to evaluate for pulmonary vasculature at the same  time.  The patient will likely be discharged to the [facility] after her respiratory failure is resolved or maybe she will  need chronic oxygen support.  Other plans as per orders.     Vania Rea, M.D.  Electronically Signed    LC/MEDQ  D:  11/01/2008  T:  11/02/2008  Job:  [id number]   cc:   Dr. Gara Kroner

## 2010-10-01 LAB — BASIC METABOLIC PANEL
CO2: 24 mEq/L (ref 19–32)
GFR calc Af Amer: >60 mL/min (ref >60)
GFR calc non Af Amer: >60 mL/min (ref >60)
Glucose, Bld: 104 mg/dL — ABNORMAL HIGH (ref 70–99)
Potassium: 4.1 mEq/L (ref 3.5–5.1)
Sodium: 130 mEq/L — ABNORMAL LOW (ref 135–145)

## 2010-10-03 NOTE — Op Note (Signed)
[name], [name]                ACCOUNT NO.:  [id number]   MEDICAL RECORD NO.:  [id number]          PATIENT TYPE:  AMB   LOCATION:  DAY                           FACILITY:  [facility]   PHYSICIAN:  Lionel December, M.D.    DATE OF BIRTH:  [birth year]   DATE OF PROCEDURE:  05/01/2005  DATE OF DISCHARGE:                                 OPERATIVE REPORT   PROCEDURE:  Esophagogastroduodenoscopy.   INDICATION:  The patient is a 70 year old Caucasian female with chronic  nausea and vomiting as well as upper abdominal pain whose CT has been  unremarkable except for hepatic hemangioma. She previously has been treated  for H pylori but only with 7 days of Helidac. Her CBC, LFTs and metabolic 7  are normal except borderline alkaline phosphatase and mild hyponatremia. She  is undergoing diagnostic EGD. Procedure and risks were reviewed with the  patient, and informed consent was obtained.   MEDICINES FOR CONSCIOUS SEDATION:  Cetacaine spray for pharyngeal topical  anesthesia. Fentanyl 50 mcg, Versed 10 mg IV.   FINDINGS:  Procedure performed in endoscopy suite. The patient's vital signs  and O2 saturation were monitored during the procedure and remained stable.  The patient was placed in left lateral position, and Olympus videoscope was  passed via oropharynx without any difficulty into esophagus.   Esophagus. Mucosa of the esophagus normal. GE junction at 36 cm.   Stomach. It was empty and distended very well with insufflation. There was  patchy erythema and granularity at antrum but no erosions or ulcers noted.  Pyloric channel was patent. Angularis, fundus and cardia examined by  retroflexing the scope and were normal.   Duodenum. Bulbar mucosa was normal. Scope was passed into the second part of  the duodenum where mucosa and folds were normal. Endoscope was withdrawn.  The patient tolerated the procedure well.   FINAL DIAGNOSIS:  Nonerosive antral gastritis, otherwise normal EGD.   This  finding would not explain the patient's symptomatology.   Given hyponatremia, I need to make sure she does not have Addison's disease  even though feel that her hyponatremia may be due to her drinking too much  water.   Need to rule out gastroparesis, but it is very likely that her symptoms are  due to extreme anxiety.   PLAN:  1.  Will check a sed rate, fasting cortisol and a TSH level as well as serum      sodium.  2.  Will proceed with solid phase gastric emptying study.      Lionel December, M.D.  Electronically Signed     NR/MEDQ  D:  05/01/2005  T:  05/01/2005  Job:  [id number]   cc:   Delaney Meigs, M.D.  Fax: [phone]
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
## 2010-10-03 NOTE — Discharge Summary (Signed)
NAME:  [name], [name]                          ACCOUNT NO.:  [id number]   MEDICAL RECORD NO.:  [id number]                   PATIENT TYPE:  IPS   LOCATION:  0403                                 FACILITY:  [facility]   PHYSICIAN:  Jeanice Lim, M.D.              DATE OF BIRTH:  1941-01-19   DATE OF ADMISSION:  02/22/2002  DATE OF DISCHARGE:  03/06/2002                                 DISCHARGE SUMMARY   IDENTIFYING DATA:  A 70 year old married Caucasian female voluntarily  admitted, presenting with a history of depression, anxiety, feeling sick for  the past two years, and wanting to die.  The plan was to shoot herself prior  to admission.  Marland Kitchen   MEDICATIONS:  1. Trazodone.  2. Xanax.  3. Prozac.   ALLERGIES:  1. DARVOCET.  2. CODEINE.  3. ASPIRIN.  4. DOLOBID.   LABORATORY DATA:  Sodium 132, potassium 3.3.  Urine drug screen positive for  benzodiazepines and cannabis.  Alcohol level less than to.   MENTAL STATUS EXAMINATION:  Passively cooperative.  Somewhat resistant.  Irritable.  Poor eye contact.  Speech was clear.  Vague responses.  Complaining of being sick.  Mood was depressed.  Affect blunted.  Thought  processes goal directed.  Thought content focused on somatic complaints.  She is obsessing on how bad she feels with some passive suicidal thoughts.  Also expressing auditory and visual hallucinations, although appearing to  respond to internal stimulus at the time of evaluation.  Cognitively intact.  Judgment and insight poor.   ADMISSION DIAGNOSES:   AXIS I:  1. Major depression, severe.  2. Anxiety disorder not otherwise specified.   AXIS II:  None.   AXIS III:  Questionable heart murmur.   AXIS IV:  Economic and other psychosocial issues.  Limited support system.  Moderate stress.   AXIS V:  Global Assessment of Functioning 25/55.   HOSPITAL COURSE:  The patient was admitted and ordered routine p.r.n.  medications.  She underwent further monitoring and was  encouraged to  participate in individual, group, and milieu therapy.  The patient was  resumed on Prozac, Trazodone, and Xanax, as well as Phenergan was given for  nausea.  Prozac was optimized and Zyprexa added for ruminating somatic  questionable delusional thoughts.  The patient was encouraged to drink  ensure due to inadequate intact.  Trileptal was added and Zyprexa optimized,  targeting psychotic  symptoms.  Zofran was ordered p.r.n. nausea and  Trileptal was discontinued due to the patient feeling this made her more  nauseated.  The patient reported a positive response to clinical  intervention.  She will participate in aftercare planning.  Her condition at  discharge was improved.  Mood was more euthymic.  Affect was brighter.  Thought process goal directed.  Thought content negative for dangerous  ideation or psychotic symptoms.  The patient reported being motivated to be  compliant with aftercare plan.  The medical consultant made recommendations,  which were followed.  The patient was deemed medically stable.  The  patient's condition on discharge was markedly improved.  The patient denied  any acute medical problems.   MEDICATIONS:  1. Loxitane 10 mg t.i.d.  2. Ambien 10 mg q.h.s. p.r.n.  3. Colace 100 mg b.i.d.  4. Prozac 20 mg three q.a.m.  5. Zyprexa 15 mg q.h.s.  6. Klonopin 1 mg t.i.d.  7. Levothyroxine 25 mcg q.a.m.  8. Trazodone 100 mg two q.h.s.   FOLLOW-UP:  The patient is to follow up at the [facility] on March 13, 2002, at 10:30 a.m.   DISCHARGE DIAGNOSES:   AXIS I:  1. Major depression, severe.  2. Anxiety disorder not otherwise specified.   AXIS II:  None.   AXIS III:  Questionable heart murmur.   AXIS IV:  Economic and other psychosocial issues.  Limited support system.  Moderate stress.   AXIS V:  Global Assessment of Functioning 55.                                               Jeanice Lim, M.D.     JEM/MEDQ  D:  04/05/2002  T:  04/06/2002  Job:  [id number]

## 2010-10-03 NOTE — Discharge Summary (Signed)
NAMEJANDA, CARGO NO.:  [id number]   MEDICAL RECORD NO.:  [id number]          PATIENT TYPE:  IPS   LOCATION:  0400                          FACILITY:  [facility]   PHYSICIAN:  Anselm Jungling, MD  DATE OF BIRTH:  02/15/1941   DATE OF ADMISSION:  06/09/2005  DATE OF DISCHARGE:  06/24/2005                                 DISCHARGE SUMMARY   IDENTIFYING DATA AND REASON FOR ADMISSION:  This was the first admission to  [facility], an involuntary one, for [name], a 70 year old married white female who  was admitted to [place] via [facility] where she was initially seen for  gastrointestinal symptoms. While there, she reported suicidal ideation,  panic and anxiety symptoms, and depression. In addition she was suspected of  abusing alcohol, but she denied this. She came to [place] on a regimen of Prozac,  trazodone and Ativan. Please refer to the admission note for further details  pertaining to the symptoms, circumstances and history that led to her  hospitalization. She was given an initial Axis I diagnosis of depressive  disorder NOS, and rule out panic disorder, and rule out alcohol abuse.   MEDICAL AND LABORATORY:  The patient has been medically treated at [facility], and was medically cleared and discharged prior to her coming  to our inpatient psychiatric service. She was again physically assessed and  medically evaluated by the psychiatric nurse practitioner upon admission to  our psychiatric service. She was complaining of nausea and received  Phenergan for this with fairly good results. However, she continued to have  difficulty with p.o. intake due to nausea and gastric discomfort throughout  the first several days of her inpatient stay.   During the middle portion of her inpatient stay, she fell while trying to  get out of bed and fell onto both knees and hit her forehead on the floor.  She was evaluated by the psychiatric nurse practitioner. The patient  denied  having lost consciousness. She denied chest pain or palpitation. She was  given ice packs. At this point her Librium dosage was diminished to reduce  the risk of further falls. There were no other significant medical issues.   HOSPITAL COURSE:  The patient was admitted to the adult inpatient  psychiatric service. She presented as a normally developed, well-nourished  adult female looking older than her stated age. She appeared weak,  tremulous, and complained of anxiety and difficulty sleeping. She was very  reluctant to participate in therapeutic groups and activities, preferring to  spend most for time in bed. On an almost daily basis, she would complain to  the undersigned that she had not slept at all during the night. She was  given various psychotropic medications to address her symptoms, including  Librium, on a tapering schedule. She was continued on Prozac 20 mg daily and  Paxil CR 12.5 mg daily. The patient continued to request doses of Librium,  and because this did not appear to be appropriate, the patient was begun on  a trial of low dose Risperdal, which was increased  stepwise to an ultimate  dose of 1 mg b.i.d. and 2 mg q.h.s..   We continued to have difficulty motivating the patient to get out of bed,  remain active during the day, and participate in therapeutic groups and  activities. Instead, she preferred to be in bed during the day, sleeping.  Because of this, it was not surprising that she had difficulty sleeping  through the night. Finally, it was necessary to have staff insist that she  remain up, out of bed, and active during the day so that she could have a  normal sleep cycle during the night.   After 14 days of inpatient treatment, the patient appeared to be close to  her baseline level of functioning. She had been up and active and  participating in therapeutic groups and activities. Her mood was brighter,  and she was less anxious. She was absent any  psychotic symptoms.   AFTERCARE:  The patient was discharged with a plan to follow-up at  [facility] health on June 25, 2005, the day following  discharge. From there, she would be connected to further outpatient  treatment resources.   DISCHARGE MEDICATIONS:  Prozac 20 mg daily, Paxil CR 12.5 mg daily,  Risperdal 1 mg  b.i.d. and 2 mg at h.s., Synthroid 25 mcg daily, and Reglan  10 mg four times daily.   DISCHARGE DIAGNOSES:  AXIS I: Depressive disorder not otherwise specified.  AXIS II: Deferred.  AXIS III: History of hypothyroidism, gastroesophageal reflux disease.Marland Kitchen  AXIS IV: Stressors severe.  AXIS V: Global assessment of function on discharge 60,           ______________________________  Anselm Jungling, MD  Electronically Signed     SPB/MEDQ  D:  06/25/2005  T:  06/25/2005  Job:  [id number]
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
## 2010-10-03 NOTE — Discharge Summary (Signed)
NAMESONALI, WIVELL                ACCOUNT NO.:  [id number]   MEDICAL RECORD NO.:  [id number]          PATIENT TYPE:  INP   LOCATION:  A222                          FACILITY:  [facility]   PHYSICIAN:  Osvaldo Shipper, MD     DATE OF BIRTH:  02/19/1941   DATE OF ADMISSION:  06/05/2005  DATE OF DISCHARGE:  LH                                 DISCHARGE SUMMARY   ADDENDUM TO DISCHARGE SUMMARY DICTATED ON June 07, 2005. PATIENT BEING  TRANSFERRED FOR INPATIENT PSYCHIATRIC EVALUATION.   Please review the discharge summary and the H and P dictated on January 21  and January 19, respectively.   BRIEF HOSPITAL COURSE:  Briefly, this patient was admitted mainly for  depression and suicidal ideation.  She has chronic nausea and vomiting  symptoms.  However, she has been having some diarrhea and was found to have  significant  electrolyte abnormality, with hyponatremia and hyperkalemia.  Patient was IV-hydrated.  She had stool for C diff which were negative.  She  was given Imodium with good relief of her diarrhea.  Her last bowel movement  was more than 12 hours ago.  Her electrolyte imbalances have all been  corrected.   Patient basically, was admitted also with suicidal ideation.  She has a 1:1  sitter in her room.  At this time, she is medically clear for transfer to  psych center.   DISCHARGE MEDICATIONS:  Discharge medications that she needs to be on  include the following:  1.  Prozac 40 mg subcu once daily.  2.  Synthroid 25 mcg p.o. once daily.  3.  Imodium 2 mg p.o. p.r.n for diarrhea, not to exceed 8 pills a day.  4.  Reglan 10 mg p.o. every a.c. and h.s.  5.  Nicotine patch 21 mg patch topically daily.   No other special studies were done on this patient.   On the day of discharge, her hemoglobin was 11.1 which is stable.  WBC 6.2  stable.  Platelet count 270 stable.  Sodium was improved to 133, from 126.  Potassium was 3.9.  Her renal function was stable.  The patient's vital  signs were stable today, and she was considered okay for discharge.   Other follow up that she will require is a recheck of her TSH and free T4 in  about two to three weeks' time.      Osvaldo Shipper, MD  Electronically Signed     GK/MEDQ  D:  06/09/2005  T:  06/09/2005  Job:  [id number]   cc:   Milus Mallick. Lodema Hong, M.D.  Fax: [phone]

## 2010-10-03 NOTE — Discharge Summary (Signed)
[facility]  Patient:    [name], [name] Visit Number: [id number] MRN: [id number]          Service Type: MED Location: 3A A326 01 Attending Physician:  Jonathon Bellows Dictated by:   Tana Coast, P.A. Admit Date:  03/23/2001 Discharge Date: 03/31/2001                             Discharge Summary  DATE OF BIRTH:  04/23/1941  ADMISSION DIAGNOSES: 1. Refractory nausea, vomiting, abdominal pain and diarrhea. 2. History of gastroparesis and gastroesophageal reflux disease.  DISCHARGE DIAGNOSES: 1. Gastroparesis. 2. Gastroesophageal reflux disease 3. Abdominal pain felt to be secondary to irritable bowel syndrome for functional disease.  Subclinical hypothyroidism.  HISTORY OF PRESENT ILLNESS:  This is a 70 year old, Caucasian female patient of Dr. Lysbeth Galas who was seen in the office the day of admission for persistent nausea, vomiting, abdominal pain and diarrhea.  She was seen in April 2002, during hospitalization for nausea and vomiting at [facility]. The patient claims symptoms have been persistent every since February 2002. In March 2002, she had an upper GI series which revealed poor clearing on supine swallowing of the esophagus, thickened folds within the duodenum, duodenal bulb and proximal duodenum suggesting a hyperacitic state, unusual appearance to the small bowel as the proximal jejunal folds are located on the right and there was very rapid transit time to the colon.  Gallbladder ultrasound revealed a small amount of sludge, hyperechoic and hypoechoic regions seen in the liver.  These regions were evaluated via CT and they were not apparent on that study.  CT of the abdomen revealed mild lesions in the cortex of both kidneys.  HIDA scan with fatty meal revealed EF of 36% and no reproduction of symptoms.  Gastric emptying study in April 2002, revealed abnormally slow emptying of the study with T-half of two hours and  13 minutes. EGD revealed retained food in the stomach suspicious for delayed gastric emptying, but otherwise normal.  She as treated for H. pylori in March 2002. On physical exam in the office, she was tender primarily in the epigastric and right upper quadrant region.  There was no rebound tenderness or guarding. She was acutely ill-appearing and was lying in the fetal position and crying. She was therefore admitted to the hospital.  LABORATORY DATA AND X-RAY FINDINGS:  WBC 6.2, hemoglobin 12.9, hematocrit 35.7, MCV 86.6, platelets 255,000.  Sedimentation rate 9.  Sodium 134, potassium 3.0, chloride 99, BUN less than 6, creatinine 0.7, total protein 6.3, albumin 3.7.  SGOT 15, SGPT 9, Alk phos 76, total bilirubin 0.4, amylase 46, lipase 28.  Stool studies to include WBC, C. difficile, culture and O&P that were all negative.  Fasting a.m. Cortisol was 15.8.  Celiac disease antibody profile was negative.  HOSPITAL COURSE:  She was rehydrated with IV fluids and hypokalemia was corrected with IV Kay Ciel.  Potassium was 4.6 two days into the admission. Sodium corrected to 139 as well.  With rehydration, her hemoglobin and hematocrit dropped to 11.9 and 33.5.  She was suspected to have subclinical hypothyroidism based on an elevated TSH of 21.08 and normal T4, T3 uptake in T3.  She was started on Synthroid 50 mcg p.o. q.d.  On the second day of admission, her T-max was 100.2.  Acute abdominal series was obtained which was normal.  She had a HIDA scan which was normal with  EF of 91%.  She received Reglan prior to the study.  Upper GI series with small bowel follow through was obtained which revealed prominent spontaneous gastroesophageal reflux reaching the level of mid to upper thoracic esophagus. The remainder of the upper GI series and small bowel follow through was normal.  She also had a CT of the abdomen and pelvis with IV and oral contrast which revealed mild atelectasis at both lung  bases, but no other abnormalities.  She had a gastric emptying study on Reglan which revealed a T-half of 50.  She had a CT of the abdomen and pelvis which revealed mild atelectasis at both lung bases, but no other abnormalities.  On the second day of hospitalization, she was started on low-dose Reglan 5 mg IV q.6h.  She was also started on Levbid one tablet p.o. q.a.m. and Protonix 40 mg p.o. q.d. She continued to complain of persistent nausea and therefore Reglan was increased to 10 mg IV q.6h.  There was no emesis recorded throughout the hospital stay.  On one notation nursing noted that they found the patient trying to self-induce vomiting.  She was eventually changed to Reglan 10 mg p.o. and this was increased to 15 mg p.o. q.a.c. and q.h.s.  Her Protonix was increased to 40 mg p.o. b.i.d.  Nutritional consult was obtained in order to necessitate dietary preference and a gastroesophageal reflux disease diet. Diarrhea resolved shortly after hospitalization and the patient began complaining of constipation receiving symptomatic treatment with Lactulose and enemas for this.  The patient was felt to have improved somewhat and there was no vomiting or diarrhea.  She continued to complain of nausea and abdominal pain at the time of discharge; however, was tolerating p.o. medications and therefore felt that she was ready for discharge.  CONDITION ON DISCHARGE:  Stable.  DISPOSITION:  Discharged to home.  DISCHARGE MEDICATIONS: 1. Prozac 40 mg q.d. 2. Xanax 0.5 mg q.i.d. 3. Trazodone 200 mg q.h.s. 4. Reglan 10 mg #180 given to take 1-1/2 tablets p.o. q.a.c. and q.h.s. with    two refills. 5. Protonix 40 mg #60 one p.o. b.i.d. with two refills. 6. Phenergan 25 mg #60 one p.o. b.i.d. p.r.n. nausea with no refills. 7. Lactulose one month supply take 2 tbl q.h.s. p.r.n. constipation, two    refills. 8. Levbid #30 take 1/2 tablet to 1 tablet p.o. q.d. p.r.n. abdominal pain, two    refills  given.  DIET:  No restrictions.  ACTIVITY:  No restrictions.   FOLLOWUP:  She was arranged to have a follow-up appointment with Dr. Jena Gauss in the office next Wednesday at 11:30 a.m. Dictated by:   Tana Coast, P.A. Attending Physician:  Jonathon Bellows DD:  03/31/01 TD:  03/31/01 Job: [id number]
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
## 2010-10-03 NOTE — H&P (Signed)
[name], [name]                ACCOUNT NO.:  [id number]   MEDICAL RECORD NO.:  [id number]          PATIENT TYPE:  INP   LOCATION:  A222                          FACILITY:  [facility]   PHYSICIAN:  Margaretmary Dys, M.D.DATE OF BIRTH:  Oct 02, 1940   DATE OF ADMISSION:  06/05/2005  DATE OF DISCHARGE:  LH                                HISTORY & PHYSICAL   Her primary care physician is Syliva Overman, M.D.   ADMITTING DIAGNOSES:  1.  Acute-on-chronic nausea and vomiting.?Gastroparesis.  2.  Dehydration.  3.  Hyponatremia.  This is also chronic and intermittent.  4.  Depression with suicidal ideation.   Ms. Casady is a 70 year old Caucasian female who was brought into the  emergency room because of complaints of depression and patient was talking  about taking her own life.  She does have a history of depression in the  past with prior hospitalizations.   She has been to [facility] for multiple admissions.   She has chronic nausea and vomiting which was evaluated with an EGD by Dr.  Karilyn Cota on May 01, 2005 which was reported to be negative, other than  for erosive gastritis.  Patient states she continues to have nausea and  vomiting.   Her initial evaluation in the emergency room revealed that she was  dehydrated.  The patient states she has not been eating or drinking.  She  denies any diarrhea.  She has a vague abdominal pain.  She has no chest  pain, either angina or pleuritic type.  More concerning is that patient is  talking about suicidal thoughts.  Patient was to have been transferred to  the behavioral health unit but because of abnormal lab work, including  hyponatremia, it was reasonably decided to keep her here.   REVIEW OF SYSTEMS:  A 10-point review of systems is otherwise negative,  except as mentioned in history of present illness.   PAST MEDICAL HISTORY:  1.  Depression.  2.  Poor compliance to medications.  3.  Multiple admissions to  the [facility] for mental      health concerns.   MEDICATIONS:  Patient is on Zyprexa, Effexor, trazodone, Ambien p.r.n.  The  patient's family is bringing in the medication bottles so we can have the  correct dose.   ALLERGIES:  She reports multiple allergies to DEMEROL, CYCLOBENZAPRINE,  AMITRIPTYLINE, CODEINE, AMPICILLIN, ASPIRIN, NONSTEROIDAL ANTI-INFLAMMATORY  AGENTS and DARVOCET.   SOCIAL HISTORY:  Patient lives with her husband.  She has several children,  apparently, with no significant support.  Patient continues to smoke about 1-  1/2 packs of cigarettes a day.  She denies any alcohol use or illicit drug  use although the patient, when I confronted her that her urine toxicology  was positive for marijuana, agreed to using this on and off for her nausea.   FAMILY HISTORY:  Noncontributory.   PHYSICAL EXAMINATION:  GENERAL:  She was conscious, comfortable.  Was not in  acute distress.  Patient appeared intermittently loopy but was well oriented  in time, place and person.  VITAL SIGNS:  Blood pressure was 146/69, pulse of 71, respirations of 20, T-  max 99.4.  HEENT:  Normocephalic, atraumatic.  Oral mucosa was dry.  No exudates were  noted.  NECK:  Supple.  No JVD or lymphadenopathy.  LUNGS:  Clear clinically with good air entry bilaterally.  HEART:  S1, S2, regular.  No S3, S4, gallops or rubs.  ABDOMEN:  Soft, nontender.  Bowel sounds are positive.  No masses palpable.  EXTREMITIES:  No pitting pedal edema.  No calf induration or tenderness was  noted.   LABORATORY DATA:  Head CT scan, which was ordered today because patient was  noted to be confused, showed no acute intracranial abnormality.  She had  moderate left cortical atrophy and bilateral basal ganglia lacunar strokes.   A gastric-emptying study done on May 04, 2005 showed delayed gastric  emptying of 120 minutes, normal 60 to 90 minutes.   White blood cell count was 10.5, hemoglobin  12.5, hematocrit 37.4, platelet  count was 300, neutrophils were 81%.  Sodium was 123, potassium 3.6,  chloride of 96, CO2 of 20, glucose of 168, BUN of 7, creatinine 1.1.  AST of  30, ALT 21, total protein 6.8, albumin 3.3, calcium was 8.5.  Urine  toxicology was positive for benzodiazepines and tetrahydrocannabinol.  Alcohol level was undetectable.  Urinalysis was negative.  Urine microscopy  was negative.   ASSESSMENT AND PLAN:  Ms. Makinzie Considine is a 70 year old Caucasian female who  presented to the emergency room with nausea and vomiting.  Patient  apparently has had these chronic complaints in the past.  However, a recent  gastric emptying study suggested evidence of gastroparesis.  Etiology of  this unclear.  Patient is currently dehydrated.  Will admit her and hydrate  her with fluids at this time.  Will treat her nausea with Phenergan and  Zofran p.r.n.  I think it is very reasonable to consider starting her on  Reglan.  However, because of her mental status changes now, we may want to  wait a little bit.  Patient seemed to be a little more clear since  admission.  Patient did inform me that she wanted to commit suicide because  she is tired of the way she was living.  We have a sitter for her and she  will be seen by the ACT team.   Resume home medications once the doses have been confirmed.   CODE STATUS:  She is a full code.     Margaretmary Dys, M.D.  Electronically Signed    AM/MEDQ  D:  06/05/2005  T:  06/06/2005  Job:  [id number]
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
## 2010-10-03 NOTE — H&P (Signed)
[name], [name]                          ACCOUNT NO.:  [id number]   MEDICAL RECORD NO.:  [id number]                   PATIENT TYPE:  IPS   LOCATION:  0403                                 FACILITY:  [facility]   PHYSICIAN:  Jeanice Lim, M.D.              DATE OF BIRTH:  1940-11-14   DATE OF ADMISSION:  02/22/2002  DATE OF DISCHARGE:                         PSYCHIATRIC ADMISSION ASSESSMENT   IDENTIFYING INFORMATION:  The patient is a 70 year old married white female  that was voluntarily admitted on February 22, 2002.   HISTORY OF PRESENT ILLNESS:  The patient presents with a history of  depression and anxiety, feeling sick for the past two years, wanting to  die.  Her plan was to shoot herself.  The patient reports on the day of  admission she was feeling short of breath, having some heart problems, and  went to the emergency department.  The patient was expressing thoughts of  suicide and was admitted for psychiatric evaluation.  The patient states she  is very worried over bills.  She states that she has warrants out for  failing to pay her doctors' bills, having positive auditory hallucinations  telling her that you're going to die, positive visual hallucinations  seeing shadows.  The patient reports she stays in bed all the time still  with no sleep, no appetite.  She reports that she continues to feel just  sick all over and wants medications.   PAST PSYCHIATRIC HISTORY:  First hospitalization to [facility].  Was in [place] in 1994.  She sees Dr. Thomasena Edis at mental health.  Her last visit was last month.  No history of a suicide attempt.   SUBSTANCE ABUSE HISTORY:  Nonsmoker.  She denies any alcohol or substance  abuse.   PAST MEDICAL HISTORY:  Primary care Tanette Chauca: Dr. Dewaine Conger and Dr. Jearld Adjutant in  [place], [place].  Medical problems: She states they heard a heart  murmur while she was in the emergency department this week.  She denies any  other  medical problems.   MEDICATIONS:  She has been on:  1. Trazodone.  2. Xanax.  3. Prozac.  She has been on 40 mg since 1995.   DRUG ALLERGIES:  DARVOCET, CODEINE, ASPIRIN, and DOLOBID.   PHYSICAL EXAMINATION:  GENERAL:  Physical examination was performed at  [facility].   LABORATORY DATA:  CBC was within normal limits.  Sodium 132, potassium 3.3.  Urine drug screen was positive for benzodiazepines and positive THC.  Alcohol level was less than 5.   SOCIAL HISTORY:  She is a 70 year old married white female, married for 13  years, fourth marriage, five children who are all grown.  She is on  disability.  She states she has several warrants out for failing to pay  doctors' bills.   FAMILY HISTORY:  None.   MENTAL STATUS EXAM:  She is alert.  She is  in bed.  She is passively  cooperative.  She states she would just rather not talk.  Fair eye contact,  evasive.  Speech is clear, vague responses.  She states that she wants  something because she is sick.  Mood is depressed.  Affect is blunt.  Thought processes are very somatic.  The patient is ruminating over how ill  she is, obsesses about how bad she feels with some passive suicidal  thoughts, expressing positive auditory and visual hallucinations.  She does  not appear to be responding to any internal stimuli at present.  Cognitive:  Intact.  Memory is fair.  Judgment and insight are poor.   ADMISSION DIAGNOSES:   AXIS I:  1. Major depression.  2. Anxiety disorder, not otherwise specified.   AXIS II:  Deferred.   AXIS III:  Questionable heart murmur.   AXIS IV:  Economic, other psychosocial problems.   AXIS V:  Current is 25, this past year is 57.   INITIAL PLAN OF CARE:  Plan is a voluntary admission to [facility] for depression, suicidal ideation, and psychosis.  Contract for  safety, check every 15 minutes.  Will put the patient on the [facility] for  close monitoring.  Will monitor her food and fluid  intake.  The patient is  considered to be a fall risk.  Will continue with the Prozac.  Will increase  the dose to stabilize her mood and thinking.  Will add Zyprexa for  psychosis.  Will add a multivitamin.  Will have a family session prior to  discharge.  The patient is to follow-up with Dr. Thomasena Edis and be medication  compliant.   ESTIMATED LENGTH OF STAY:  Five to six days or more depending on the  patient's response to medications.      Landry Corporal, N.P.                       Jeanice Lim, M.D.    JO/MEDQ  D:  02/23/2002  T:  02/23/2002  Job:  [id number]

## 2010-10-03 NOTE — H&P (Signed)
[name], [name]                          ACCOUNT NO.:  [id number]   MEDICAL RECORD NO.:  [id number]                   PATIENT TYPE:  IPS   LOCATION:  0403                                 FACILITY:  [facility]   PHYSICIAN:  Geoffery Lyons, M.D.                   DATE OF BIRTH:  1940-07-03   DATE OF ADMISSION:  07/21/2002  DATE OF DISCHARGE:                         PSYCHIATRIC ADMISSION ASSESSMENT   IDENTIFYING INFORMATION:  A 70 year old married white female, involuntarily  committed on July 21, 2002.   HISTORY OF PRESENT ILLNESS:  The patient presents with a history of altered  mental status, was brought to the emergency department per her husband, that  she was behaving abnormally.  She was hallucinating, feeling very nervous.  The patient reports that she has not been feeling well for several weeks  prior to this admission.  Husband thought that she was taking too much  medication.  The patient has an underlying depressive problem.  Chart  indicates that the patient had been drinking large amounts of water, walking  into walls as if they were an entrance.  Family reported that the patient  also has a reported weight loss.  The patient reports only that she feels  depressed.   PAST PSYCHIATRIC HISTORY:  Second hospitalization to [facility], was at [facility] in the fall of 2003.  Sees Dr. Thomasena Edis  at [facility].   SOCIAL HISTORY:  She is a 70 year old married white female.  She lives with  her husband.  She has 4 children.  She in unemployed.  Remainder of social  history is unclear at this time.  The patient is very sleepy and unable to  provide much information.   FAMILY HISTORY:  Unclear.   ALCOHOL DRUG HISTORY:  No apparent alcohol or drug use.   PAST MEDICAL HISTORY:  Primary care [name] is unknown.  Medical problems:  None apparent, although there are reports of weight loss.   MEDICATIONS:  Klonopin 0.5 mg t.i.d.,  Prozac 30 mg daily, Zyprexa 15 mg  q.h.s., Ambien 10 mg q.h.s.  Prescriptions were prescribed by Dr. Thomasena Edis at  [facility], [id number].   DRUG ALLERGIES:  CODEINE.   PHYSICAL EXAMINATION:  Performed at [facility].  Of significance:  The patient  appears chronically ill, was wandering around the emergency department.  Some psychomotor retardation.  Appears to be responding to internal stimuli.  Her glucose is 107, sodium is 132, potassium is 2.7. SGPT was 8, alcohol  level less than 5.  CBC was within normal limits.   MENTAL STATUS EXAM:  She is in bed, very drowsy, awakened after several  attempts of tactile stimulation but little eye contact.  Speech is very soft  spoken, mood is depressed, very drowsy, difficult to determine her affect.  Thought processes are positive auditory hallucinations prior, does not  appear to be responding  at this time.  Cognitive function.  Memory unable to  ascertain due to patient's sleepiness.  Judgment and insight are poor, poor  historian.    ADMISSION DIAGNOSES:   AXIS I:  Psychosis not otherwise specified.   AXIS II:  Deferred.   AXIS III:  None.   AXIS IV:  Deferred.   AXIS V:  Current is 30, estimated this past year 66.   PLAN:  Involuntary commitment for psychotic symptoms.  Contract for safety,  check every 15 minutes.  The patient will be placed on the 400 hall.  We  will check her labs.  Will assist with ADLs as needed.  Will offer Gatorade.  Will hold her routine medications for now until the patient awakens and is  able to provide more information.  Will contact husband for background  information.  Stabilize mood and thinking so the patient can be safe.  Will  monitor vital signs and fluid status closely, have internal medicine consult  if need be.  The patient to follow up with mental health.   TENTATIVE LENGTH OF CARE:  4-5 days.      Landry Corporal, N.P.                       Geoffery Lyons, M.D.    JO/MEDQ  D:  08/01/2002   T:  08/01/2002  Job:  [id number]

## 2010-10-03 NOTE — Discharge Summary (Signed)
[name], [name]                          ACCOUNT NO.:  [id number]   MEDICAL RECORD NO.:  [id number]                   PATIENT TYPE:  IPS   LOCATION:  0400                                 FACILITY:  [facility]   PHYSICIAN:  Geoffery Lyons, M.D.                   DATE OF BIRTH:  Aug 18, 1940   DATE OF ADMISSION:  03/10/2003  DATE OF DISCHARGE:  03/21/2003                                 DISCHARGE SUMMARY   CHIEF COMPLAINT AND PRESENTING ILLNESS:  This was 4th admission to [facility]  for this 70 year old married white female,  voluntarily admitted for suicidal ideas and alcohol abuse.  History of  suicidal ideas, has been wanting to overdose.  The intention was to kill  herself.  Has been depressed since her husband's recent jail sentence for  DUI.  The patient reported that her daughter-in-law locked the pills away.  She recently started drinking beer to help with the depression as she  claimed.  Drinking 3 beers.  Positive auditory hallucinations, command type,  to hurt herself.   PAST PSYCHIATRIC HISTORY:  Fourth time [employer].  Sees Dr. Rudi Heap  in [facility].   ALCOHOL AND DRUG HISTORY:  As already stated, has been drinking beer.   PAST MEDICAL HISTORY:  Heart murmur.   MEDICATIONS:  Effexor XR 75 mg per day, Ambien 10 at bedtime for sleep,  Klonopin and Zyprexa.   PHYSICAL EXAMINATION:  Performed, failed to show any acute findings.   LABORATORY DATA:  Urine drug screen was negative.  CBC within normal  limits., potassium 3.1.   MENTAL STATUS EXAM:  Reveals an alert unkempt female, cooperative, fair eye  contact, pupils clear.  Mood was depressed, affect was restricted.  Thought  process:  Endorsed positive auditory hallucinations, no visual  hallucinations, no paranoia.  Expressed suicidal ideas, although will  contract for safety.  Cognition well preserved.   ADMISSION DIAGNOSES:   AXIS I:  1. Major depression with  psychotic features.  2. Alcohol abuse.   AXIS II:  No diagnosis.   AXIS III:  Heart murmur.   AXIS IV:  Moderate.   AXIS V:  Global assessment of function upon admission 25, highest global  assessment of function in past year 60-65.   OTHER LABORATORY DATA:  TSH 6.668.   COURSE IN HOSPITAL:  She was admitted and started on intensive individual  and group psychotherapy.  She was detoxified with Librium.  She was  maintained on Effexor 150 in the morning, Zyprexa 20 mg at night.  She was  given trazodone 100 for sleep.  Trazodone was eventually increased to 150 at  night and Effexor 150 in the morning and 75 in the afternoon and then  finally to 150 twice a day.  She was given some Vistaril as needed for  anxiety.  Initially endorsing that she had difficult  time with sleep.  The  main stressor was the husband being in jail, diagnosed with DUI, also having  hepatitis C.  Concerned about the situation and that he might not make it.  Very depressed, endorsing suicidal ideation.  Exhibited a lot of psychomotor  retardation, little insight and coping skills, endorsing hopelessness and  helplessness.  Isolating, unwilling interaction with people, overwhelmed  with the way she felt.  Continued to evidence depressed mood and affect,  lack of energy, having a hard time getting up.  She was given Provigil 200  mg in the morning.  Finally she started feeling a little better.  She was  having motor retardation.  She was finally given Ambien for sleep, still  endorsing the voices but slowly started going away.  We explored  possibilities of placement but she was resistant to this idea.  She was  going to go back with her family.  On November 3 she was baseline.  She was  not going to an assisted living, going home.  She claimed to have people  helping her.  There were no suicidal ideas, no homicidal ideas.  Still upset  with the fact that the husband was in jail, unable to handle it.    DISCHARGE DIAGNOSES:   AXIS I:  1. Major depression with psychotic features.  2. Alcohol abuse.   AXIS II:  No diagnosis.   AXIS III:  Heart murmur.   AXIS IV:  Moderate.   AXIS V:  Global assessment of function upon discharge 55.   DISCHARGE MEDICATIONS:  1. Zyprexa 20 mg at night and 2.5 twice a day.  2. Effexor XR 150 twice a day.  3. Trazodone 150 at night.  4. Vistaril 25 every 6 hours as needed for anxiety.  5. Ambien 10 mg 1-2 at night for sleep.   DISPOSITION:  Follow up at [facility].                                               Geoffery Lyons, M.D.    IL/MEDQ  D:  04/18/2003  T:  04/19/2003  Job:  [id number]

## 2010-10-03 NOTE — Discharge Summary (Signed)
NAME:  [name], [name]                          ACCOUNT NO.:  [id number]   MEDICAL RECORD NO.:  [id number]                   PATIENT TYPE:  IPS   LOCATION:  0403                                 FACILITY:  [facility]   PHYSICIAN:  Hipolito Bayley, M.D.               DATE OF BIRTH:  1941/02/23   DATE OF ADMISSION:  07/21/2002  DATE OF DISCHARGE:  08/02/2002                                 DISCHARGE SUMMARY   PATIENT IDENTIFICATION:  The patient is a 70 year old white married female  who was admitted on involuntary commitment due to altered mental status.  She was hallucinating, feeling very nervous and anxious.  The patient was  drinking extensive amounts of water and her husband thought that she was  taking too much medication.  Previously, the patient was hospitalized in the  fall 2003 at [facility].  She followed with Dr. Thomasena Edis at  [facility].   Medically, the patient suffered from some weight loss but otherwise, she  seemed to be medically fit.  Prior to admission, she was treated with  Klonopin and Zyprexa.   HOSPITAL COURSE:  After admission to the ward, the patient was placed on  special observation.  She was restarted on Zyprexa 2.5 mg three times a day  and 10 mg at bedtime and Klonopin 0.25 mg p.r.n. anxiety.  Prozac was  started in the dose of 20 mg daily.  The patient was initially seen by  Geoffery Lyons, M.D., who increased the patient's Zyprexa and adjusted the dose  of antidepressant.  On March 8 and March 9, she was still very depressed.  I  further increased Zyprexa and added Klonopin p.r.n.  The patient was being  very somatic.  She did not feel that medications were helpful.  She  apparently had workup done at [facility] and CT scan of the head was  normal.  I asked for a medical consultation because of abdominal pain,  nausea, vomiting, and weight loss.  Review of blood work showed decrease of  thyroid function tests and  Juan-Carlos Monguilod, M.D., started the patient  on Synthroid 50 mcg daily.  He felt that there were some symptoms of  irritable bowel syndrome and gastroparesis.  He introduced Bentyl on a daily  basis.  The patient was followed by Rosanne Sack, M.D., while on  the unit and she made gradual improvement.  I replaced Prozac with Effexor,  which was well tolerated and the dose was gradually increased.  In the  meantime, the patient was seen twice by Geoffery Lyons, M.D., who also noticed  improvement.  On March 16, there was some increase of anxiety, likely  related to increased dose of Effexor as well as anticipation of discharge.  At this point, dose of Effexor was lowered to 75 to 100 mg daily and Zyprexa  was reduced to 50 mg at bedtime.  Klonopin  was held for symptoms of anxiety.  On March 17, the patient denied hallucinations, felt that she was doing  better, was not going to die.  She denied dangerous ideations.  Affect was  bright.  She was much less somatic in her presentation.  The patient's  husband felt that she decent improvement and she could be ready for  discharge.  Decision was made to discharge the patient home in the care of  her family and continue treatment on an outpatient basis.   Medical problems were outlined in Dr. Gailen Shelter consultations.  Review of  laboratory data showed normal CBC, normal Chem 17, borderline elevation of  TSH, normal T3 and T4.  Folate level was normal.  Urine drug screen showed  in initial test marijuana.  The patient denied smoking marijuana.  Urinalysis showed bacteria and positive for increased leukocyte esterase.  Urine culture showed E. Coli, which was sensitive to Bactrim and Bactrim was  introduced in the dose of DS tablet twice a day.   Vital signs were stable with blood pressure 130/70, normal pulse,  respiratory rate, and temperature.   DISCHARGE DIAGNOSES:   AXIS I:  1. Major depression, recurrent, severe with psychotic  features.  2. Rule out schizoaffective disorder.   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   AXIS III:  1. Irritable bowel syndrome.  2. Urinary tract infection.  3. Rule out hypothyroidism.   AXIS IV:  Psychosocial stressors moderate, family situation, medical  problems.   AXIS V:  Global assessment at the time of admission was 30, upon discharge  between 60 and 65, maximum for the past year 60.   DISCHARGE MEDICATIONS:  1. Reglan 10 mg four times a day.  2. Protonix 40 mg daily.  3. Klonopin 0.5 mg one half tablet twice a day.  4. Effexor XR twice a day.  5. Zyprexa 15 mg at bedtime.  6. Flonase spray twice a day.  7. Ambien 10 mg one half or one tablet if unable to sleep after midnight.   DISCHARGE INSTRUCTIONS:  The patient should call or come to the emergency  room if recurrence of symptoms.  She should check with her medical doctor  due to recent urinary tract infection and other medical problems.  She had  an appointment with [facility] on Monday, March  22.  She was discharged in good condition in the care of her husband.                                               Hipolito Bayley, M.D.    JS/MEDQ  D:  09/19/2002  T:  09/20/2002  Job:  [id number]

## 2010-10-03 NOTE — Discharge Summary (Signed)
[name], SERVICE                ACCOUNT NO.:  [id number]   MEDICAL RECORD NO.:  [id number]          PATIENT TYPE:  INP   LOCATION:  A222                          FACILITY:  [facility]   PHYSICIAN:  Mobolaji B. Bakare, M.D.DATE OF BIRTH:  1940-08-09   DATE OF ADMISSION:  06/05/2005  DATE OF DISCHARGE:  06/07/2005                                 DISCHARGE SUMMARY   PRIMARY CARE PHYSICIAN:  Syliva Overman, M.D.   FINAL DIAGNOSES:  1.  Suicide ideation.  2.  Acute on chronic nausea and vomiting.  3.  Hyponatremia.  4.  Dehydration.  5.  Major depression.   HISTORY OF PRESENT ILLNESS:  [name] is a 70 year old Caucasian female  with history of depression.  She presented to the emergency room with nausea  and vomiting which has been a chronic problem.  She was evaluated in the  past about a year ago with an upper endoscopy which revealed antral  gastritis.   She had some suicidal ideation and this was further pursued by the ACT team  but the patient thought not to be a suitable candidate for admission into  the Behavioral Health unit because of hyponatremia and she was admitted into  the regular medical floor for further evaluation and treatment.   HOSPITAL COURSE:  1.  Nausea and vomiting:  This seems to be a chronic problem for [name].      She has been evaluated with an upper endoscopy and also a gastric      emptying test which was suggestive of gastroparesis.  She was started on      IV fluids to rehydrate her and Phenergan with and without Zofran.  She      was observed for 48 hours and at the time of discharge nausea and      vomiting resolved.   1.  Suicidal ideation:  The patient had a 24-hour sitter and there was no      attempt at suicide.  At this point, is medically clear to proceed with      inpatient treatment at the [facility] unit.   1.  Diarrhea:  She had a complaint of diarrhea during the hospitalization.      However, the record did not support  this complaint.  Nevertheless,      request was made for stool studies but there was no stool produced by      the patient.   1.  Electrolyte abnormalities:  She had hyponatremia and hypokalemia which      is thought to be secondary to poor p.o. intake.  The patient was      hydrated with normal saline with potassium supplement.  At this point      repeat B-MET is pending.   DISCHARGE MEDICATIONS:  1.  Prozac 40 mg by mouth daily.  2.  Trazodone 300 mg by mouth at bedtime.   DISCHARGE CONDITION:  Stable.   DISPOSITION:  To the Behavioral Health Unit.      Mobolaji B. Corky Downs, M.D.  Electronically Signed     MBB/MEDQ  D:  06/07/2005  T:  06/07/2005  Job:  [id number]   cc:   Milus Mallick. Lodema Hong, M.D.  Fax: [phone]   Lionel December, M.D.  [address]  [place]  [place] [address]
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
## 2010-10-03 NOTE — Consult Note (Signed)
[name], [name] NO.:  [id number]   MEDICAL RECORD NO.:  [id number]          PATIENT TYPE:  AMB   LOCATION:                                FACILITY:  [facility]   PHYSICIAN:  [name], M.D.    DATE OF BIRTH:  [birth year]   DATE OF CONSULTATION:  04/22/2005  DATE OF DISCHARGE:                                   CONSULTATION   REQUESTING PHYSICIAN:  [name], M.D., [facility],  [place], [place].   REASON FOR CONSULTATION:  Chronic nausea and vomiting.   HISTORY OF PRESENT ILLNESS:  The patient is a 70 year old Caucasian female,  patient of Dr. Lysbeth Crane, who presents today for further evaluation of chronic  nausea and vomiting. She has been on Phenergan off and on for several months  now. She states her symptoms started back in August 2006. She has been  vomiting almost on a daily basis. She complains of upper abdominal pain.  Denies any hematemesis. She occasionally has heartburn and indigestion. She  takes Phenergan with some relief. She stays thirsty all of the time. She has  frequent urination but no nocturia or dysuria. She complains of urinary  incontinence. She has gained about 60 pounds in the last two to three years.  She notes that her upper abdominal pain is worse postprandially. Denies any  melena, rectal bleeding, constipation or diarrhea. In August 2006, she had a  CT of the abdomen and pelvis. 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  This has not been done. She also had a transient small-bowel intussusception  with no further workup. She says she was in the hospital about a month or so  ago at [facility] but did not have any significant workup  done. She was treated for Helicobacter pylori with Helidac therapy. She can  only tolerate about seven days'  worth of therapy. In June 2006, her sodium  was 132, glucose 116. Otherwise, LFTs, MET7 and CBC normal.   CURRENT MEDICATIONS:  1.  Lorazepam 0.5 mg q.i.d.  2.  Trazodone 150 mg 1/2 tablet nightly.  3.  Phenergan 25 mg t.i.d. p.r.n.  4.  Fluoxetine 40 mg daily.   ALLERGIES:  AMPICILLIN, CODEINE, DEMEROL, and DILAUDID.   PAST MEDICAL HISTORY:  1.  Gastroesophageal reflux disease.  2.  Hiatal hernia.  3.  Positive Helicobacter pylori serologies. Underwent seven days of Helidac      therapy. Could not tolerate two weeks due to side effects.   PAST SURGICAL HISTORY:  Cholecystectomy for cholelithiasis a couple of years  ago by Dr. Cleotis Nipper. She reports prior colonoscopy but cannot remember when  or where this was done.   FAMILY HISTORY:  Mother died at age 62, had diabetes mellitus. Father died  of Alzheimer's disease. She has a brother who was treated for colon cancer  and is doing well.   SOCIAL HISTORY:  She is married and has five  sons. She is on disability. She  smokes one pack of cigarettes daily. She quit drinking a couple of years  ago. Previously drank one beer daily.   REVIEW OF SYSTEMS:  See HPI for GI and constitutional. CARDIOPULMONARY:  No  chest pain or shortness of breath. See HPI for GU.   PHYSICAL EXAMINATION:  VITAL SIGNS:  Weight 190, height 5 foot 5.  Temperature 98.2, blood pressure 152/90, pulse 72.  GENERAL:  Pleasant, well-developed, well-nourished, Caucasian female in no  acute distress. She is accompanied by her daughter-in-law.  SKIN:  Warm and dry. No jaundice.  HEENT:  Conjunctivae are pink. Sclerae nonicteric. Oropharyngeal mucosa:  Her lips are dry and with some cracking in the creases. No lesions, erythema  or exudate. Dentition in poor repair.  NECK:  No lymphadenopathy or thyromegaly.  CHEST:  Lungs are clear to auscultation.  CARDIAC:  Reveals regular rate and rhythm. Normal S1 and S2. No murmurs,  rubs, or gallops.  ABDOMEN:  Positive  bowel sounds. Obese but symmetrical. Soft. She has  moderate epigastric tenderness and some mild lower abdominal tenderness to  deep palpation. No organomegaly or masses appreciated. No rebound tenderness  or guarding. No abdominal bruits or hernias.  EXTREMITIES:  No edema.   IMPRESSION:  [name] is a 70 year old lady with four-month history of  almost daily nausea and vomiting associated with upper abdominal pain.  Gallbladder was removed two years ago. She also complains of extreme thirst  and urinary frequency. CT of the abdomen and pelvis revealed a hypervascular  lesion in the liver, possibly hemangioma, which needs to have further work.  She also had small bowel intussusception which was transient. Etiology of  her symptoms are unclear at this time. Cannot exclude the possibility of  underlying diabetes mellitus. She may have peptic ulcer disease,  gastroparesis or carcinoma not excluded. Small-bowel tumor needs to be  excluded given transient small-bowel intussusception on CT.   PLAN:  1.  CBC, MET7, LFTs.  2.  EGD.  3.  Will retrieve recent medical records from [facility] and      also try to find prior colonoscopy      report.  4.  Further recommendations to follow.   I would like to thank Dr. Joette Crane for allowing [place] to take part in the  care of this patient.      [name], P.A.      [name], M.D.  Electronically Signed    LL/MEDQ  D:  04/22/2005  T:  04/22/2005  Job:  [id number]   cc:   [name], M.D.  Fax: [phone]

## 2010-10-08 ENCOUNTER — Other Ambulatory Visit: Payer: Self-pay | Admitting: Adult Health

## 2010-10-08 DIAGNOSIS — G8320 Monoplegia of upper limb affecting unspecified side: Secondary | ICD-10-CM

## 2010-10-09 ENCOUNTER — Ambulatory Visit (HOSPITAL_COMMUNITY)
Admission: RE | Admit: 2010-10-09 | Discharge: 2010-10-09 | Disposition: A | Discharge status: Home / Self Care | Payer: Medicare Other | Source: Ambulatory Visit | Attending: Adult Health | Admitting: Adult Health

## 2010-10-09 ENCOUNTER — Encounter (HOSPITAL_COMMUNITY): Payer: Self-pay

## 2010-10-09 ENCOUNTER — Inpatient Hospital Stay (HOSPITAL_COMMUNITY): Admission: RE | Admit: 2010-10-09 | Payer: Medicare Other | Source: Ambulatory Visit

## 2010-10-09 DIAGNOSIS — G319 Degenerative disease of nervous system, unspecified: Secondary | ICD-10-CM | POA: Insufficient documentation

## 2010-10-09 DIAGNOSIS — G8330 Monoplegia, unspecified affecting unspecified side: Secondary | ICD-10-CM | POA: Insufficient documentation

## 2010-10-09 DIAGNOSIS — M47812 Spondylosis without myelopathy or radiculopathy, cervical region: Secondary | ICD-10-CM | POA: Insufficient documentation

## 2010-10-09 DIAGNOSIS — Z8673 Personal history of transient ischemic attack (TIA), and cerebral infarction without residual deficits: Secondary | ICD-10-CM | POA: Insufficient documentation

## 2010-10-09 DIAGNOSIS — G8320 Monoplegia of upper limb affecting unspecified side: Secondary | ICD-10-CM

## 2010-10-09 HISTORY — DX: Essential (primary) hypertension: I10

## 2010-10-16 ENCOUNTER — Other Ambulatory Visit: Payer: Self-pay | Admitting: Internal Medicine

## 2010-10-16 DIAGNOSIS — G839 Paralytic syndrome, unspecified: Secondary | ICD-10-CM

## 2010-10-16 DIAGNOSIS — M79602 Pain in left arm: Secondary | ICD-10-CM

## 2010-10-20 ENCOUNTER — Other Ambulatory Visit: Payer: Self-pay | Admitting: Internal Medicine

## 2010-10-20 DIAGNOSIS — G839 Paralytic syndrome, unspecified: Secondary | ICD-10-CM

## 2010-10-21 ENCOUNTER — Other Ambulatory Visit: Payer: Self-pay | Admitting: Internal Medicine

## 2010-10-21 ENCOUNTER — Ambulatory Visit (HOSPITAL_COMMUNITY)
Admission: RE | Admit: 2010-10-21 | Discharge: 2010-10-21 | Disposition: A | Discharge status: Home / Self Care | Payer: Medicare Other | Source: Ambulatory Visit | Attending: Internal Medicine | Admitting: Internal Medicine

## 2010-10-21 DIAGNOSIS — G839 Paralytic syndrome, unspecified: Secondary | ICD-10-CM

## 2010-10-21 DIAGNOSIS — G319 Degenerative disease of nervous system, unspecified: Secondary | ICD-10-CM | POA: Insufficient documentation

## 2010-10-21 DIAGNOSIS — Z8673 Personal history of transient ischemic attack (TIA), and cerebral infarction without residual deficits: Secondary | ICD-10-CM | POA: Insufficient documentation

## 2010-10-21 DIAGNOSIS — I679 Cerebrovascular disease, unspecified: Secondary | ICD-10-CM | POA: Insufficient documentation

## 2010-10-21 DIAGNOSIS — G8320 Monoplegia of upper limb affecting unspecified side: Secondary | ICD-10-CM | POA: Insufficient documentation

## 2010-10-28 NOTE — Consult Note (Signed)
NAME:  [name], [name] NO.:  [id number]  MEDICAL RECORD NO.:  [id number]           PATIENT TYPE:  E  LOCATION:  WLED                         FACILITY:  [facility]  PHYSICIAN:  Ardeth Sportsman, MD     DATE OF BIRTH:  1940-06-19  DATE OF CONSULTATION: DATE OF DISCHARGE:                                HISTORY & PHYSICAL   REFERRING PHYSICIAN:  Dr. Preston Fleeting, ER.  CHIEF COMPLAINT:  Abdominal pain.  BRIEF HISTORY:  The patient is a 70 year old Caucasian female with schizoaffective disorder and severe dementia who presents to the ER with abdominal pain.  The ER record shows that EMS reported she did have some nausea and vomiting.  We do not have any firsthand information.  On seeing the patient, she currently tell me that "I am not doing well." She repeats that multiple times.  When I came back at second time, "my belly hurts," that is the only question she can really answer.  She is not oriented to time and place.  She is unable to answer any questions. There are 2 contact numbers, one [phone], which is first, her son and daughter-in-law.  I have got a voice mail for that.  There is also number for the [facility], [phone].  I got a voice mail for that but I know that the director is en route to the hospital.  PAST MEDICAL HISTORY: 1. Her last hospitalization was November 01, 2008 through November 03, 2008     with hypoxic respiratory failure, questionable pneumonia.  The     [facility] and developed some     medical issues and was hospitalized. 2. History of anemia. 3. History of hypothyroid. 4. History of bipolar disease, dementia, and schizoaffective disorder,     suicidal ideations, and panic attacks. 5. Hypertension. 6. GERD/hiatal hernia. 7. T4-L1 compression fractures.  PAST SURGICAL HISTORY:  None could be obtained.  FAMILY HISTORY:  Could not be obtained.  SOCIAL HISTORY:  From the record, there is no history of  alcohol, tobacco, or drugs.  The patient currently lives in Group Home.  I cannot obtain this information from the patient.  REVIEW OF SYSTEMS:  Could not be obtained.  CURRENT MEDICATIONS:  I am told by the pharmacy tech that the list we have is not completely right, but what we have is: 1. Wellbutrin 150 mg daily 2. Lasix 40 mg daily. 3. Synthroid 50 mcg daily. 4. Zestril 2.5 mg daily. 5. Prilosec 20 mg daily. 6. Plavix 75 mg daily. 7. Cymbalta 20 mg b.i.d. 8. Os-Cal D 500 mg 1 b.i.d. 9. Vitamin D 400 units p.o. b.i.d. 10.Depakote EC 250 mg t.i.d. 11.Ativan 0.5 mg t.i.d. 12.Aricept 10 mg daily. 13.Clonidine 0.1 mg q.6 h. p.r.n. for blood pressure greater than     155/90. 14.Potassium 10 mEq daily. 15.Propranolol 10 mg daily.  Again, this medication list is being verified.  ALLERGIES:  This is from her hospitalization, November 01, 2008, includes, 1. CODEINE. 2. DEMEROL. 3. PENICILLIN. 4. FLEXERIL. 5. DILAUDID.  PHYSICAL EXAMINATION:  GENERAL:  This is a well-nourished, but  confused white female in no acute distress. VITAL SIGNS:  Temperature on admission was 98, heart rate is 81, blood pressure 119/71, respiratory rate is 18, and saturation is 93% on room air. HEAD:  Normocephalic. EYES:  Pupils are equal and reactive. MOUTH:  There are multiple teeth missing. NOSE AND THROAT:  Grossly normal.  Trachea is in the midline.  There is no palpable thyroid.  No bruits.  No JVD.  Respiratory effort is normal. CHEST:  Clear anteriorly.  She really could not sit up because of the pain. CARDIAC:  Normal S1 and S2.  She has a 1/6 systolic murmur heard at the left sternal border. ABDOMEN:  She is extremely tender mostly in the right and lower quadrants, more so on the right than the left.  There are bowel sounds. Abdomen is not really distended.  No hernia, masses, or abscesses. GU/RECTAL:  Deferred. LYMPHATIC:  Lymphadenopathy, none palpated. MUSCULOSKELETAL:  No joint changes  noted. SKIN:  Normal. NEUROLOGIC:  She is awake, but disoriented.  She really cannot answer many questions.  She has no memory of anything.  Does not know how long she has been sick or when her belly discomfort started. PSYCHIATRIC:  Positive for dementia.  LABORATORY DATA:  UA shows 7 to 10 white cells per high-power field. Lipase 18, direct bilirubin is 0.1, total bilirubin 0.5, alkaline phosphatase 105, SGOT is 13, SGPT is 7, total protein 6.6, and albumin is 3.2.  White count is 11.8, hemoglobin is 14, hematocrit is 40, and platelets are 82,000.  Appendix is dilated.  There is a large amount of surrounding periappendiceal and right lower quadrant inflammation. There are numerous right lower quadrant bowel loops, which are inflamed probably secondary to appendicitis.  There is a small amount of free fluid in the pelvis.  Small bowel loops are prominent, fluid filled likely related to an ileus and this goes all the way to the terminal ileum.  The colon is decompressed.  Descending colonic and sigmoid diverticulosis noted without diverticulitis.  Uterus and adnexa are normal.  Bladder is normal.  IMPRESSION: 1. Appendicitis. 2. Dementia, schizoaffective disorder.  She lives in group home. 3. History of hypoxic respiratory failure, pneumonia, October 28, 2008. 4. Hypothyroid. 5. Hypertension. 6. Gastroesophageal reflux disease, hiatal hernia. 7. T4-L1 compression fractures. 8. [facility], history of suicidal     ideations, and panic attacks. 9. Thrombocytopenia, on Plavix.  PLAN:  We will review with Dr. Michaell Cowing.  We are going to ask the hospitalist to see and help manage psych and medical issues after he reviews to decide on surgery, patient has thrombocytopenia and on Plavix.     Eber Hong, P.A.   ______________________________ Ardeth Sportsman, MD    WDJ/MEDQ  D:  09/19/2010  T:  09/19/2010  Job:  [id number]  cc:   Eber Hong, P.A.  Electronically Signed by Sherrie George P.A. on 10/05/2010 08:40:55 PM Electronically Signed by Karie Soda MD on 10/28/2010 03:51:15 PM

## 2010-11-03 NOTE — Discharge Summary (Addendum)
NAME:  [name], [name] NO.:  [id number]  MEDICAL RECORD NO.:  [id number]           PATIENT TYPE:  I  LOCATION:  1438                         FACILITY:  [facility]  PHYSICIAN:  Lennie Muckle, MD      DATE OF BIRTH:  Jan 20, 1941  DATE OF ADMISSION:  09/19/2010 DATE OF DISCHARGE:  10/01/2010                              DISCHARGE SUMMARY   HISTORY OF PRESENT ILLNESS:  [name] is a 70 year old female with history of schizoaffective disorder and severe dementia, who presented to the emergency department with severe abdominal pain.  She was worked up in the emergency department, there she was a very poor historian due to her dementia.  She was noted to have a history of bipolar disorder, schizoaffective disorder, anxiety, and severe dementia.  She also has a history of hypertension, gastroesophageal reflux disease, history of hypothyroidism, history of anemia, and remote history of CVA that she takes Plavix for.  However, review of systems of current complaints was very difficult to reach due to her dementia.  However, her workup in the emergency department included labs and imaging.  There were some significant abnormalities.  Her white blood cell count was elevated at 11.8.  Platelets were noted to be 82,000 with her being on Plavix.  Imaging showed right lower quadrant bowel loops with secondary inflammation as well as evidence of appendicitis.  Decision was made therefore to admit the patient for appendicitis with operative necessity as well as inpatient management of comorbid medical conditions.  SUMMARY OF HOSPITAL COURSE:  The patient was admitted on Sep 19, 2010, by Dr. Karie Soda.  The patient was consulted on by the hospitalist group for medical management of hyponatremia, dementia and hypothyroidism. Her Plavix was held to reduce the bleeding risk for operative intervention.  Once the Plavix was held for 3 days, the patient was taken to the operating room  on May 7 and underwent laparoscopic appendectomy with irrigation and evacuation of abscess fluid.  She was copiously irrigated and subsequently closed, and taken to the recovery room and ultimately to the floor in a stable condition. Postoperatively, the patient did develop a significant ileus secondary to the amount of inflammation.  She was placed on bowel rest including an NG tube, which ultimately the patient pulled due to her dementia. Her electrolytes were corrected.  Her thrombocytopenia improved after being taken off Plavix which has not been restarted.  Ultimately, the patient's bowels began to move with good function.  She was started on a liquid diet and advanced to a regular diet.  She has had no additional complications or sequelae during this admission.  Her psychiatric issues have remained stable throughout the course of her hospitalization.  She has not yet been restarted on Plavix.  Due to her dementia, she would be at a significant fall risk and her thrombocytopenia with an admission platelet count of 82,000.  It is concerning to keep the patient on, however, her discharge platelet count is back to a normal level at 283,000.  DISCHARGE DIAGNOSES: 1. Acute ruptured appendicitis, status post laparoscopic appendectomy. 2. History of bipolar disorder. 3.  Hypertension. 4. Dementia. 5. Hyponatremia - resolved. 6. Schizoaffective disorder. 7. Remote history of cerebrovascular accident. 8. Gastroesophageal reflux disease.  DISCHARGE MEDICATIONS: 1. Ativan 0.5 mg 1 tablet 3 times daily. 2. Cymbalta 20 mg twice daily. 3. Depakote 250 mg 1 tablet q.a.m., 2 tablets 12 noon and 2 tablets     p.m. 4. Benazepril 10 mg daily. 5. Lasix 20 mg daily. 6. Os-Cal 500 mg plus D 1 tablet twice daily. 7. Potassium 10 mEq daily. 8. Prilosec 20 mg daily. 9. Propranolol 10 mg twice daily. 10.Simvastatin 20 mg daily. 11.Sodium chloride 1 g daily. 12.Synthroid 75 mcg 1 tablet  daily. 13.Vitamin D 400 units 1 tablet twice daily. 14.Wellbutrin 150 mg daily. 15.Zestril 2.5 mg daily. 16.She was given a prescription for Vicodin 5/325 one to two tablets     q.6 h. p.r.n. pain. 17.She has some Chloraseptic throat spray to use for throat     irritation. 18.Her Plavix was continued to be withheld at this point.  We would     leave at the discretion of the attending physician to resume.  She     does have an aspirin allergy, otherwise we would recommend low-dose     aspirin in place of the Plavix.  Due to this allergy, the patient     may need to be placed on Plavix for history of CVA.  The patient, otherwise, has dissolvable/absorbable closure in her incisions.  She can shower or be bathed regularly.  She needs to follow up with Dr. Ovidio Kin in approximately 2 to 3 weeks; if there is any concern for increased pain, fever, nausea, vomiting, or problems with the incision, this seems to be sooner.     Brayton El, PA-C   ______________________________ Lennie Muckle, MD    KB/MEDQ  D:  10/01/2010  T:  10/01/2010  Job:  [id number]  Electronically Signed by Bertram Savin MD on 11/03/2010 12:12:34 PM Electronically Signed by Brayton El  on 11/10/2010 02:32:28 PM

## 2010-11-20 ENCOUNTER — Encounter (INDEPENDENT_AMBULATORY_CARE_PROVIDER_SITE_OTHER): Payer: Self-pay | Admitting: Surgery

## 2010-11-25 ENCOUNTER — Encounter (INDEPENDENT_AMBULATORY_CARE_PROVIDER_SITE_OTHER): Payer: Self-pay | Admitting: Surgery

## 2010-11-26 ENCOUNTER — Encounter (INDEPENDENT_AMBULATORY_CARE_PROVIDER_SITE_OTHER): Payer: Self-pay | Admitting: Surgery

## 2010-11-26 ENCOUNTER — Ambulatory Visit (INDEPENDENT_AMBULATORY_CARE_PROVIDER_SITE_OTHER): Payer: Medicare Other | Admitting: Surgery

## 2010-11-26 VITALS — BP 102/60 | HR 64 | Temp 96.0°F | Ht 64.0 in | Wt 156.0 lb

## 2010-11-26 DIAGNOSIS — F319 Bipolar disorder, unspecified: Secondary | ICD-10-CM | POA: Insufficient documentation

## 2010-11-26 DIAGNOSIS — I6930 Unspecified sequelae of cerebral infarction: Secondary | ICD-10-CM | POA: Insufficient documentation

## 2010-11-26 DIAGNOSIS — K37 Unspecified appendicitis: Secondary | ICD-10-CM

## 2010-11-26 NOTE — Progress Notes (Signed)
The patient is a 70 year old white female with schizoaffective disorder and dementia who presented with acute appendicitis on 19 Sep 2010. She was admitted by Dr. Estelle Grumbles.  I did a laparoscopic appendectomy on her on 22 Sep 2010 for a ruptured appendix. She was discharged to a nursing home on 01 Oct 2010.  [Patient rolled herself out to the waiting area and we had trouble finding her!!]  She is staying at the [facility].  She has no one with her and is a very poor historian.  She comes in a wheelchair and can not stand on her own.  The patient can give only a limited history with no family member or support person here is limited bypass the patient.  The discharge summary reviews her history.  Physical exam: (Sitting in a wheelchair) Abdomen: Soft. Incision is well-healed. No redness or evidence of hernia.  Assessment and plan: #1. Status post laparoscopic appendectomy for ruptured appendicitis. She appears to recovered well with no long-term problems. This can be her last visit with [place]. #2. History of bipolar disease, dementia, and schizoaffective disorder. #3. Left arm weakness apparently  secondary to prior stroke. #4. Posterior office in wheelchair with no assistance.

## 2010-11-26 NOTE — Patient Instructions (Addendum)
1.  You have done well from the appendectomy.  This can be your last visit.

## 2010-12-24 ENCOUNTER — Ambulatory Visit (HOSPITAL_COMMUNITY)
Admission: RE | Admit: 2010-12-24 | Discharge: 2010-12-24 | Disposition: A | Discharge status: Home / Self Care | Payer: Medicare Other | Source: Ambulatory Visit | Attending: Internal Medicine | Admitting: Internal Medicine

## 2010-12-24 ENCOUNTER — Other Ambulatory Visit: Payer: Self-pay | Admitting: Internal Medicine

## 2010-12-24 DIAGNOSIS — Z8673 Personal history of transient ischemic attack (TIA), and cerebral infarction without residual deficits: Secondary | ICD-10-CM | POA: Insufficient documentation

## 2010-12-24 DIAGNOSIS — I639 Cerebral infarction, unspecified: Secondary | ICD-10-CM

## 2010-12-24 DIAGNOSIS — H538 Other visual disturbances: Secondary | ICD-10-CM | POA: Insufficient documentation

## 2010-12-24 DIAGNOSIS — G9389 Other specified disorders of brain: Secondary | ICD-10-CM | POA: Insufficient documentation

## 2011-02-10 LAB — CREATININE, SERUM: GFR calc Af Amer: >60

## 2012-09-23 ENCOUNTER — Non-Acute Institutional Stay (SKILLED_NURSING_FACILITY): Payer: Medicare Other | Admitting: Nurse Practitioner

## 2012-09-23 ENCOUNTER — Encounter: Payer: Self-pay | Admitting: Nurse Practitioner

## 2012-09-23 DIAGNOSIS — E785 Hyperlipidemia, unspecified: Secondary | ICD-10-CM

## 2012-09-23 DIAGNOSIS — R05 Cough: Secondary | ICD-10-CM

## 2012-09-23 DIAGNOSIS — E039 Hypothyroidism, unspecified: Secondary | ICD-10-CM | POA: Insufficient documentation

## 2012-09-23 DIAGNOSIS — F319 Bipolar disorder, unspecified: Secondary | ICD-10-CM

## 2012-09-23 NOTE — Assessment & Plan Note (Addendum)
07/25/12 TSH was 2.29- will cont current treatment but will check TSh due to pts recent fatigue and myalgias

## 2012-09-23 NOTE — Assessment & Plan Note (Signed)
Labs from 05/25/12 stable

## 2012-09-23 NOTE — Progress Notes (Deleted)
Patient ID: [name], female   DOB: 1940/09/11, 72 y.o.   MRN: [id number]  Chief Complaint: medical management of chronic conditions  HPI:  ***  Review of Systems:  *** (delete stars and type .ros)  Medications: Patient's Medications  New Prescriptions   No medications on file  Previous Medications   ASPIRIN 81 MG EC TABLET    Take 81 mg by mouth daily.     BENAZEPRIL (LOTENSIN) 10 MG TABLET    Take 10 mg by mouth daily.     BUPROPION (WELLBUTRIN SR) 150 MG 12 HR TABLET    Take 150 mg by mouth 2 (two) times daily.     CALCIUM-VITAMIN D (OSCAL WITH D) 500-200 MG-UNIT PER TABLET    Take 1 tablet by mouth 2 (two) times daily.     CLOPIDOGREL (PLAVIX) 75 MG TABLET    Take 75 mg by mouth daily.     DIVALPROEX (DEPAKOTE) 250 MG 24 HR TABLET    Take 750 mg by mouth daily.    DONEPEZIL (ARICEPT) 10 MG TABLET    Take 10 mg by mouth at bedtime as needed.     DULOXETINE (CYMBALTA) 20 MG CAPSULE    Take 60 mg by mouth daily.    FUROSEMIDE (LASIX) 20 MG TABLET    Take 20 mg by mouth daily.    HYDROCODONE-ACETAMINOPHEN (NORCO) 5-325 MG PER TABLET    Take 1 tablet by mouth every 6 (six) hours as needed.     LEVOTHYROXINE (SYNTHROID, LEVOTHROID) 75 MCG TABLET    Take 150 mcg by mouth daily.    LISINOPRIL (PRINIVIL,ZESTRIL) 2.5 MG TABLET    Take 2.5 mg by mouth daily.     LORAZEPAM (ATIVAN) 0.5 MG TABLET    Take 0.25 mg by mouth 3 (three) times daily. One tablet daily and may have BID PRN for anxiety   PHENOL (CHLORASEPTIC) 1.4 % LIQD    Use as directed 1 spray in the mouth or throat as needed.     POTASSIUM CHLORIDE (K-DUR) 10 MEQ TABLET    Take 10 mEq by mouth 1 day or 1 dose.     PROPRANOLOL (INDERAL) 10 MG TABLET    Take 10 mg by mouth 2 (two) times daily.     RANITIDINE (ZANTAC) 75 MG TABLET    Take 75 mg by mouth 2 (two) times daily.     RISPERIDONE (RISPERDAL) 0.25 MG TABLET    Take 0.25 mg by mouth daily.   SENNA (SENOKOT) 8.6 MG TABLET    Take 1 tablet by mouth daily.     SIMVASTATIN  (ZOCOR) 20 MG TABLET    Take 20 mg by mouth at bedtime.     SODIUM CHLORIDE 1 G TABLET    Take 1 g by mouth 1 day or 1 dose.     VITAMIN D, CHOLECALCIFEROL, 400 UNITS TABLET    Take 400 Units by mouth 2 (two) times daily.    Modified Medications   No medications on file  Discontinued Medications   OMEPRAZOLE (PRILOSEC) 20 MG CAPSULE    Take 20 mg by mouth daily.       Physical Exam: *** (delete stars and type .physexam)  Filed Vitals:   09/23/12 1312  BP: 117/74  Pulse: 62  Temp: 97.3 F (36.3 C)  Resp: 20  SpO2: 90%        Assessment/Plan Bipolar disease, chronic 05/25/2012 A1c 5.5, 09/19/2012 valproic acid level 61.6 Patient is stable; continue current regimen.  Will monitor and make changes as necessary.  Unspecified hypothyroidism 07/25/12 TSH was 2.29- will cont current treatment   Other and unspecified hyperlipidemia Labs from 05/25/12 stable  Fatigues and malaise  Will get chest xray, cbc with diff, bmp

## 2012-09-23 NOTE — Assessment & Plan Note (Addendum)
05/25/2012 A1c 5.5, 09/19/2012 valproic acid level 61.6 Followed by psych services  Patient is currently stable; continue current regimen. Will monitor and make changes as necessary.

## 2012-09-28 ENCOUNTER — Encounter: Payer: Self-pay | Admitting: Nurse Practitioner

## 2012-09-28 NOTE — Progress Notes (Signed)
Patient ID: [name], female   DOB: [birth year], 72 y.o.   MRN: [id number]  Chief Complaint: medical management of chronic conditions   HPI:  Patient is a long term resident of the facility who was seen today for routine visit. Pt reports she has not been feeling well with increased shortness of breath and cough. No fevers or chill or chest pain.   244.9-HYPOTHYROIDISM The hypothyroidism is stable.No complications noted from the medication presently being used. 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 The patient has had little change in behavior.No complications noted from the medication presently being used.takes aricept 10 mg daily  296.7-BIPOLAR AFFECTIVE DISORDER  is without change is taking  wellbutrin sr 150 mg daily takes ativan 0.25 mg  daily and 0.25 mg twice daily as needed; takes cymbalta 30 mg twice daily takes risperdal 0.25 mg twice daily  is taking depakote  750 mg in the pm to stabilize mood. is followed by psych services  401.9-HTN UNSPECIFIED The blood pressure readings taken outside the office since the last visit have been in the target range. No complications noted from the medication presently being used.takes propranolol 10 mg twice daily, takes zestril 2.5 mg daily 438.9-CVA, LATE EFFECT The neurological status has not changed.No complications noted from the medication presently being used. takes asa 81 mg daily takes plavix daily  530.11-GERD The patient's dyspeptic symptoms remain stable.No complications noted from the medication presently being used. takes zantac 150 mg twice daily  564.00-CONSTIPATION The symptoms are stable.The medication is well tolerated. No other therapies  have been tried.takes  senna 2 tabs twice daily 782.3-EDEMA The edema is stable.No complications noted from the medication presently being used. takes lasix 20 mg daily, takes k+ 10 meq daily;   Review of Systems:  Review of Systems  Constitutional: Positive for malaise/fatigue. Negative for fever and chills.  Respiratory: Positive for cough, sputum production (yellowish) and shortness of breath. Negative for wheezing.   Cardiovascular: Negative for chest pain, palpitations and leg swelling.  Gastrointestinal: Negative for heartburn, nausea, abdominal pain, diarrhea and constipation.  Genitourinary: Negative for dysuria, urgency and frequency.  Musculoskeletal: Negative for myalgias and back pain.  Skin: Negative.   Neurological: Negative for dizziness, weakness and headaches.    Medications: Patient's Medications  New Prescriptions   No medications on file  Previous Medications   ASPIRIN 81 MG EC TABLET    Take 81 mg by mouth daily.     BENAZEPRIL (LOTENSIN) 10 MG TABLET    Take 10 mg by mouth daily.     BUPROPION (WELLBUTRIN SR) 150 MG 12 HR TABLET    Take 150 mg by mouth 2 (two) times daily.     CALCIUM-VITAMIN D (OSCAL WITH D) 500-200 MG-UNIT PER TABLET    Take 1 tablet by mouth 2 (two) times daily.     CLOPIDOGREL (PLAVIX) 75 MG TABLET    Take 75 mg by mouth daily.     DIVALPROEX (DEPAKOTE) 250 MG 24 HR TABLET    Take 750 mg by mouth daily.    DONEPEZIL (ARICEPT) 10 MG TABLET    Take 10 mg by mouth at bedtime as needed.     DULOXETINE (CYMBALTA) 20 MG CAPSULE    Take  60 mg by mouth daily.    FUROSEMIDE (LASIX) 20 MG TABLET    Take 20 mg by mouth daily.    HYDROCODONE-ACETAMINOPHEN (NORCO) 5-325 MG PER TABLET    Take 1 tablet by mouth every 6 (six) hours as needed.     LEVOTHYROXINE (SYNTHROID, LEVOTHROID) 75 MCG TABLET    Take 150 mcg by mouth daily.    LISINOPRIL (PRINIVIL,ZESTRIL) 2.5 MG TABLET    Take 2.5 mg by mouth daily.     LORAZEPAM (ATIVAN) 0.5 MG TABLET    Take 0.25 mg  by mouth 3 (three) times daily. One tablet daily and may have BID PRN for anxiety   PHENOL (CHLORASEPTIC) 1.4 % LIQD    Use as directed 1 spray in the mouth or throat as needed.     POTASSIUM CHLORIDE (K-DUR) 10 MEQ TABLET    Take 10 mEq by mouth 1 day or 1 dose.     PROPRANOLOL (INDERAL) 10 MG TABLET    Take 10 mg by mouth 2 (two) times daily.     RANITIDINE (ZANTAC) 75 MG TABLET    Take 75 mg by mouth 2 (two) times daily.     RISPERIDONE (RISPERDAL) 0.25 MG TABLET    Take 0.25 mg by mouth daily.   SENNA (SENOKOT) 8.6 MG TABLET    Take 1 tablet by mouth daily.     SIMVASTATIN (ZOCOR) 20 MG TABLET    Take 20 mg by mouth at bedtime.     SODIUM CHLORIDE 1 G TABLET    Take 1 g by mouth 1 day or 1 dose.     VITAMIN D, CHOLECALCIFEROL, 400 UNITS TABLET    Take 400 Units by mouth 2 (two) times daily.    Modified Medications   No medications on file  Discontinued Medications   OMEPRAZOLE (PRILOSEC) 20 MG CAPSULE    Take 20 mg by mouth daily.       Physical Exam: Filed Vitals:   09/23/12 1312  BP: 117/74  Pulse: 62  Temp: 97.3 F (36.3 C)  Resp: 20  SpO2: 90%    Physical Exam  Constitutional: She is well-developed, well-nourished, and in no distress. No distress.  Cardiovascular: Normal rate and regular rhythm.   Pulmonary/Chest: Effort normal.  Diminished Breath sounds  Abdominal: Bowel sounds are normal.  Musculoskeletal: She exhibits no edema and no tenderness.  Neurological: She is alert.  Skin: Skin is warm and dry. She is not diaphoretic.      Assessment/Plan Bipolar disease, chronic 05/25/2012 A1c 5.5, 09/19/2012 valproic acid level 61.6 Followed by psych services  Patient is currently stable; continue current regimen. Will monitor and make changes as necessary.  Unspecified hypothyroidism 07/25/12 TSH was 2.29- will cont current treatment but will check TSh due to pts recent fatigue and myalgias   Other and unspecified hyperlipidemia Labs from 05/25/12 stable     Fatigues and malaise with cough and shortness of breath Will get chest xray, cbc with diff, bmp

## 2012-10-28 ENCOUNTER — Other Ambulatory Visit: Payer: Self-pay | Admitting: *Deleted

## 2012-10-28 MED ORDER — LORAZEPAM 0.5 MG PO TABS: ORAL_TABLET | ORAL | Status: DC

## 2012-11-08 ENCOUNTER — Non-Acute Institutional Stay (SKILLED_NURSING_FACILITY): Payer: Medicare Other | Admitting: Adult Health

## 2012-11-08 DIAGNOSIS — I10 Essential (primary) hypertension: Secondary | ICD-10-CM

## 2012-11-08 DIAGNOSIS — K219 Gastro-esophageal reflux disease without esophagitis: Secondary | ICD-10-CM

## 2012-11-08 DIAGNOSIS — E785 Hyperlipidemia, unspecified: Secondary | ICD-10-CM

## 2012-11-08 DIAGNOSIS — E039 Hypothyroidism, unspecified: Secondary | ICD-10-CM

## 2012-11-08 DIAGNOSIS — G8929 Other chronic pain: Secondary | ICD-10-CM

## 2012-11-08 DIAGNOSIS — R609 Edema, unspecified: Secondary | ICD-10-CM

## 2012-11-08 DIAGNOSIS — F0150 Vascular dementia without behavioral disturbance: Secondary | ICD-10-CM

## 2012-11-08 DIAGNOSIS — F319 Bipolar disorder, unspecified: Secondary | ICD-10-CM

## 2012-11-08 DIAGNOSIS — E871 Hypo-osmolality and hyponatremia: Secondary | ICD-10-CM

## 2012-11-08 DIAGNOSIS — K5900 Constipation, unspecified: Secondary | ICD-10-CM

## 2012-11-08 DIAGNOSIS — I639 Cerebral infarction, unspecified: Secondary | ICD-10-CM

## 2012-11-08 DIAGNOSIS — I6350 Cerebral infarction due to unspecified occlusion or stenosis of unspecified cerebral artery: Secondary | ICD-10-CM

## 2013-01-20 ENCOUNTER — Non-Acute Institutional Stay (SKILLED_NURSING_FACILITY): Payer: Medicare Other | Admitting: Internal Medicine

## 2013-01-20 ENCOUNTER — Encounter: Payer: Self-pay | Admitting: Internal Medicine

## 2013-01-20 DIAGNOSIS — I10 Essential (primary) hypertension: Secondary | ICD-10-CM | POA: Insufficient documentation

## 2013-01-20 DIAGNOSIS — I6350 Cerebral infarction due to unspecified occlusion or stenosis of unspecified cerebral artery: Secondary | ICD-10-CM

## 2013-01-20 DIAGNOSIS — M79609 Pain in unspecified limb: Secondary | ICD-10-CM

## 2013-01-20 DIAGNOSIS — D649 Anemia, unspecified: Secondary | ICD-10-CM

## 2013-01-20 DIAGNOSIS — I639 Cerebral infarction, unspecified: Secondary | ICD-10-CM

## 2013-01-20 DIAGNOSIS — E039 Hypothyroidism, unspecified: Secondary | ICD-10-CM

## 2013-01-20 DIAGNOSIS — F0390 Unspecified dementia without behavioral disturbance: Secondary | ICD-10-CM

## 2013-01-20 DIAGNOSIS — F319 Bipolar disorder, unspecified: Secondary | ICD-10-CM

## 2013-01-20 DIAGNOSIS — M7989 Other specified soft tissue disorders: Secondary | ICD-10-CM

## 2013-01-20 DIAGNOSIS — E785 Hyperlipidemia, unspecified: Secondary | ICD-10-CM

## 2013-01-20 DIAGNOSIS — M79631 Pain in right forearm: Secondary | ICD-10-CM

## 2013-01-20 NOTE — Progress Notes (Signed)
Patient ID: [name], female   DOB: 10-20-1940, 72 y.o.   MRN: [id number] Location:  Location:  [facility] Provider:  Gwenith Spitz. Renato Gails, D.O., C.M.D.  Code Status:  Full code   Chief Complaint  Patient presents with  . Medical Managment of Chronic Issues  . Acute Visit    swollen right forearm and hand    HPI:  72 yo morbidly obese white female with h/o depression, hypothyroidism, dementia, chronic pain, constipation and vitamin D deficiency was seen for medical mgt of chronic diseases and acute onset of right forearm pain and swelling.  She has some chronic pain and swelling of her left hand and wrist due to old fractures that did not heal properly from trauma.  Xrays have already been ordered for the right forearm and hand, but results have not yet returned.  Another provider also ordered a uric acid level on her which is normal.    Review of Systems:  Review of Systems  Constitutional: Negative for fever, chills, weight loss and malaise/fatigue.  Eyes: Negative for blurred vision.  Respiratory: Positive for shortness of breath.        Is short of breath in bed--was sitting with chin tucked to chest falling off pillow though able to adjust her position herself in bed--advised to sit up some so she can breathe properly  Cardiovascular: Negative for chest pain.  Gastrointestinal: Negative for abdominal pain.  Genitourinary: Negative for dysuria.  Musculoskeletal: Positive for joint pain.  Skin: Negative for rash.  Neurological: Negative for dizziness and headaches.  Psychiatric/Behavioral: Positive for depression and memory loss.    Medications: Patient's Medications  New Prescriptions   No medications on file  Previous Medications   ASPIRIN 81 MG EC TABLET    Take 81 mg by mouth daily.     BENAZEPRIL (LOTENSIN) 10 MG TABLET    Take 10 mg by mouth daily.     BUPROPION (WELLBUTRIN) 75 MG TABLET    Take 75 mg by mouth daily.   CALCIUM-VITAMIN D (OSCAL WITH D)  500-200 MG-UNIT PER TABLET    Take 1 tablet by mouth 2 (two) times daily.     CLOPIDOGREL (PLAVIX) 75 MG TABLET    Take 75 mg by mouth daily.     DIVALPROEX (DEPAKOTE) 250 MG 24 HR TABLET    Take 750 mg by mouth daily.    DONEPEZIL (ARICEPT) 10 MG TABLET    Take 10 mg by mouth at bedtime as needed.     DULOXETINE (CYMBALTA) 20 MG CAPSULE    Take 60 mg by mouth daily.    FUROSEMIDE (LASIX) 20 MG TABLET    Take 20 mg by mouth daily.    HYDROCODONE-ACETAMINOPHEN (NORCO) 5-325 MG PER TABLET    Take 1 tablet by mouth every 4 (four) hours as needed (moderate pain, 2 tabs q 4 hours as needed for severe pain).    LEVOTHYROXINE (SYNTHROID, LEVOTHROID) 75 MCG TABLET    Take 125 mcg by mouth daily.    LISINOPRIL (PRINIVIL,ZESTRIL) 2.5 MG TABLET    Take 2.5 mg by mouth daily.     LORAZEPAM (ATIVAN) 0.5 MG TABLET    Take 1/2 tablet once a day for anxiety. Take 1/2 half tablet twice a day as needed for breakthrough pain.   MENTHOL-ZINC OXIDE (REMEDY CALAZIME) 0.2-20 % PSTE    Apply topically. Apply to buttocks each shift for preventive care after cleansing with normal saline   MULTIPLE VITAMIN (MULTIVITAMIN) TABLET  Take 1 tablet by mouth daily.   PHENOL (CHLORASEPTIC) 1.4 % LIQD    Use as directed 1 spray in the mouth or throat as needed.     POLYETHYLENE GLYCOL POWDER (GLYCOLAX/MIRALAX) POWDER    Take 17 g by mouth daily.   POTASSIUM CHLORIDE (K-DUR) 10 MEQ TABLET    Take 10 mEq by mouth 1 day or 1 dose.     PROPRANOLOL (INDERAL) 10 MG TABLET    Take 10 mg by mouth 2 (two) times daily.     RANITIDINE (ZANTAC) 75 MG TABLET    Take 75 mg by mouth 2 (two) times daily.     RISPERIDONE (RISPERDAL) 0.25 MG TABLET    Take 0.25 mg by mouth daily.   SENNA (SENOKOT) 8.6 MG TABLET    Take 2 tablets by mouth 2 (two) times daily.    SIMVASTATIN (ZOCOR) 20 MG TABLET    Take 40 mg by mouth at bedtime.    SODIUM CHLORIDE 1 G TABLET    Take 1 g by mouth 3 (three) times daily.    VITAMIN D, CHOLECALCIFEROL, 400 UNITS  TABLET    Take 400 Units by mouth 2 (two) times daily.    Modified Medications   No medications on file  Discontinued Medications   BUPROPION (WELLBUTRIN SR) 150 MG 12 HR TABLET    Take 150 mg by mouth 2 (two) times daily.      Physical Exam:  Physical Exam  Nursing note and vitals reviewed. Constitutional: She appears well-developed and well-nourished. No distress.  Morbidly obese white female  HENT:  Head: Normocephalic and atraumatic.  Cardiovascular: Normal rate, regular rhythm, normal heart sounds and intact distal pulses.   Pulmonary/Chest: Effort normal and breath sounds normal. No respiratory distress.  Abdominal: Soft. Bowel sounds are normal. She exhibits no distension. There is no tenderness.  Musculoskeletal:  Golf ball sized swelling on dorsal surface of right forearm just above wrist, tender to touch, no ecchymoses or erythema present, ROM unchanged;  Left hand with fingers in chronic extension, h/o prior fxs there  Neurological: She is alert.  Poor short term memory  Skin: Skin is warm and dry.   Labs reviewed: 01/18/13: TSH 0.096, free T4 1.92 Uric acid 3.5  Significant Diagnostic Results:  xrays of right hand and wrist pending  Assessment/Plan 1. Bipolar disease, chronic Cont wellbutrin, risperdal, depakote, cymbalta--I question if she truly needs all four of these medications--does have h/o schizoaffective disorder as well  2. Unspecified hypothyroidism Decrease synthroid to due to lab results  3. Other and unspecified hyperlipidemia Statin was recently increased due to high lipids--recheck flp next routine visit  4. Pain and swelling of right forearm Await xray results, etiology unclear--?hematoma from hitting her arm on something--no trauma known and patient does not have recollection of trauma to that arm/hand  5. Hypertension At goal with current therapy  6. Dementia Poor short term memory during visit, can do some self-care, feeds  herself--cont aricept, not on namenda at this point  7. Anemia F/u cbc next routine visit  8. Bipolar 1 disorder As in depression above  9. Cerebrovascular accident In past, cont secondary prevention with lipid control, asa, plavix, bp control;  Will plan to check hba1c with next routine visit, as well due to obesity  Family/ staff Communication: discussed with nursing supervisor Goals of care: full code Labs/tests ordered:  Pending xrays of right hand and wrist

## 2013-01-27 ENCOUNTER — Non-Acute Institutional Stay (SKILLED_NURSING_FACILITY): Payer: Medicare Other | Admitting: Nurse Practitioner

## 2013-01-27 ENCOUNTER — Encounter: Payer: Self-pay | Admitting: Nurse Practitioner

## 2013-01-27 DIAGNOSIS — E039 Hypothyroidism, unspecified: Secondary | ICD-10-CM

## 2013-01-27 NOTE — Progress Notes (Signed)
Patient ID: [name], female   DOB: [birth year], 72 y.o.   MRN: [id number]  Nursing Home Location:  [facility]   Place of Service: SNF [phone])  Chief Complaint  Patient presents with  . lab review    HPI:  Patient is a long term resident of the facility who was seen today for clarification of TSH and thyroid medication. pts lab reveal her TSH has recently been low. Pt taking medication and no complications noted from the medication presently being used. Taking synthroid 150 mcg daily-- labs were previously seen and addressed by Dr Renato Gails but after reviewing the [facility] pt is still receiving 150 mcg  dose.    Review of Systems:  DATA OBTAINED: from patient, nurse, medical record GENERAL:no fevers, fatigue, appetite changes SKIN: No itching RESPIRATORY: No cough, wheezing, SOB CARDIAC: No chest pain, palpitations GI: No abdominal pain, No N/V/D or constipation, No heartburn or reflux  GU: No dysuria, frequency or urgency, or incontinence  MUSCULOSKELETAL: No unrelieved bone/joint pain NEUROLOGIC: Awake, alert  Medications: Patient's Medications  New Prescriptions   No medications on file  Previous Medications   ASPIRIN 81 MG EC TABLET    Take 81 mg by mouth daily.     BENAZEPRIL (LOTENSIN) 10 MG TABLET    Take 10 mg by mouth daily.     BUPROPION (WELLBUTRIN) 75 MG TABLET    Take 75 mg by mouth daily.   CALCIUM-VITAMIN D (OSCAL WITH D) 500-200 MG-UNIT PER TABLET    Take 1 tablet by mouth 2 (two) times daily.     CLOPIDOGREL (PLAVIX) 75 MG TABLET    Take 75 mg by mouth daily.     DIVALPROEX (DEPAKOTE) 250 MG 24 HR TABLET    Take 750 mg by mouth daily.    DONEPEZIL (ARICEPT) 10 MG TABLET    Take 10 mg by mouth at bedtime as needed.     DULOXETINE (CYMBALTA) 20 MG CAPSULE    Take 60 mg by mouth daily.    FUROSEMIDE (LASIX) 20 MG TABLET    Take 20 mg by mouth daily.    HYDROCODONE-ACETAMINOPHEN (NORCO) 5-325 MG PER TABLET    Take 1 tablet by mouth every 4 (four) hours  as needed (moderate pain, 2 tabs q 4 hours as needed for severe pain).    LEVOTHYROXINE (SYNTHROID, LEVOTHROID) 75 MCG TABLET    Take 150 mcg by mouth daily.    LISINOPRIL (PRINIVIL,ZESTRIL) 2.5 MG TABLET    Take 2.5 mg by mouth daily.     LORAZEPAM (ATIVAN) 0.5 MG TABLET    Take 1/2 tablet once a day for anxiety. Take 1/2 half tablet twice a day as needed for breakthrough pain.   MENTHOL-ZINC OXIDE (REMEDY CALAZIME) 0.2-20 % PSTE    Apply topically. Apply to buttocks each shift for preventive care after cleansing with normal saline   MULTIPLE VITAMIN (MULTIVITAMIN) TABLET    Take 1 tablet by mouth daily.   PHENOL (CHLORASEPTIC) 1.4 % LIQD    Use as directed 1 spray in the mouth or throat as needed.     POLYETHYLENE GLYCOL POWDER (GLYCOLAX/MIRALAX) POWDER    Take 17 g by mouth daily.   POTASSIUM CHLORIDE (K-DUR) 10 MEQ TABLET    Take 10 mEq by mouth 1 day or 1 dose.     PROPRANOLOL (INDERAL) 10 MG TABLET    Take 10 mg by mouth 2 (two) times daily.     RANITIDINE (ZANTAC) 75 MG TABLET  Take 75 mg by mouth 2 (two) times daily.     RISPERIDONE (RISPERDAL) 0.25 MG TABLET    Take 0.25 mg by mouth daily.   SENNA (SENOKOT) 8.6 MG TABLET    Take 2 tablets by mouth 2 (two) times daily.    SIMVASTATIN (ZOCOR) 20 MG TABLET    Take 40 mg by mouth at bedtime.    SODIUM CHLORIDE 1 G TABLET    Take 1 g by mouth 3 (three) times daily.    VITAMIN D, CHOLECALCIFEROL, 400 UNITS TABLET    Take 400 Units by mouth 2 (two) times daily.    Modified Medications   No medications on file  Discontinued Medications   No medications on file     Physical Exam:  Filed Vitals:   01/27/13 1634  BP: 125/67  Pulse: 74  Temp: 98.3 F (36.8 C)  Resp: 18     GENERAL APPEARANCE: Alert, conversant. No acute distress.  SKIN: No diaphoresis or rash HEAD: Normocephalic, atraumatic  EYES: Conjunctiva/lids clear. Pupils round, reactive. EOMs intact. Marland Kitchen  NECK: No thyroid tenderness, enlargement or nodule  RESPIRATORY:  Breathing is even, unlabored. Lung sounds are clear   CARDIOVASCULAR: Heart RRR no murmurs, rubs or gallops.  ARTERIAL: radial pulse 2+ GASTROINTESTINAL: Abdomen is soft, non-tender, not distended w/ normal bowel sounds. GENITOURINARY: Bladder non tender, not distended  MUSCULOSKELETAL: No abnormal joints or musculature NEUROLOGIC: Moves all extremities no tremor.  Labs reviewed/Significant Diagnostic Results: 01/18/13: TSH 0.096   Assessment/Plan Hypothyroidism Pt should be on synthroid 125 mcg daily  Recheck TSH in 6 weeks

## 2013-02-17 ENCOUNTER — Non-Acute Institutional Stay (SKILLED_NURSING_FACILITY): Payer: Medicare Other | Admitting: Nurse Practitioner

## 2013-02-17 DIAGNOSIS — E039 Hypothyroidism, unspecified: Secondary | ICD-10-CM

## 2013-02-17 DIAGNOSIS — F32A Depression, unspecified: Secondary | ICD-10-CM | POA: Insufficient documentation

## 2013-02-17 DIAGNOSIS — I639 Cerebral infarction, unspecified: Secondary | ICD-10-CM

## 2013-02-17 DIAGNOSIS — I6350 Cerebral infarction due to unspecified occlusion or stenosis of unspecified cerebral artery: Secondary | ICD-10-CM

## 2013-02-17 DIAGNOSIS — I10 Essential (primary) hypertension: Secondary | ICD-10-CM

## 2013-02-17 DIAGNOSIS — F0390 Unspecified dementia without behavioral disturbance: Secondary | ICD-10-CM

## 2013-02-17 DIAGNOSIS — F319 Bipolar disorder, unspecified: Secondary | ICD-10-CM

## 2013-02-17 DIAGNOSIS — F329 Major depressive disorder, single episode, unspecified: Secondary | ICD-10-CM

## 2013-02-17 DIAGNOSIS — D649 Anemia, unspecified: Secondary | ICD-10-CM

## 2013-02-17 NOTE — Progress Notes (Signed)
Patient ID: [name], female   DOB: 18-Jan-1941, 72 y.o.   MRN: [id number]   PCP: Kimber Relic, MD   Allergies  Allergen Reactions  . Demerol   . Codeine   . Dilaudid [Hydromorphone Hcl]   . Elavil [Amitriptyline]   . Flexeril [Cyclobenzaprine Hcl]   . Penicillins     Chief Complaint  Patient presents with  . Medical Managment of Chronic Issues    HPI:  72 yo morbidly obese white female with h/o depression, hypothyroidism, dementia, chronic pain, constipation and vitamin D deficiency was seen today for routine follow up of chronic diseases. Pt is without any new complaints and staff without concerns.    HYPOTHYROIDISM The hypothyroidism is stable.No complications noted from the medication presently being used. takes synthroid 125 mcg daily  HYPERLIPIDEMIA The patient's most recent LDL was near goal.No complications noted from the medication presently being used.takes zocor 40 mg daily  DEMENTIA, VASCULAR, UNCOMPLICATED The Alzheimer's disease symptoms have not changed. The patient has had little change in behavior.No complications noted from the medication presently being used.takes aricept 10 mg daily  BIPOLAR AFFECTIVE DISORDER is without change; taking wellbutrin sr 150 mg daily takes ativan 0.25 mg daily and 0.25 mg twice daily as needed; takes cymbalta 30 mg twice daily takes risperdal 0.25 mg twice daily is taking depakote 750 mg in the pm to stabilize mood. is followed by psych services  HTN UNSPECIFIED The blood pressure readings taken outside the office since the last visit have been in the target range. No complications noted from the medication presently being used.takes propranolol 10 mg twice daily, takes zestril 2.5 mg daily  CVA, LATE EFFECT The neurological status has not changed.No complications noted from the medication presently being used. takes asa 81 mg daily takes plavix daily  GERD The patient's dyspeptic symptoms remain stable.No complications noted from  the medication presently being used. takes zantac 150 mg twice daily  CONSTIPATION The symptoms are stable.The medication is well tolerated. No other therapies have been tried.takes senna 2 tabs twice daily  EDEMA The edema is stable.No complications noted from the medication presently being used. takes lasix 20 mg daily, takes k+ 10 meq daily;   Review of Systems:  Review of Systems  Constitutional: Negative for fever, chills, weight loss and malaise/fatigue.  Eyes: Negative for blurred vision.  Respiratory: Positive for shortness of breath.        Chronically short of breath  Cardiovascular: Negative for chest pain.  Gastrointestinal: Negative for abdominal pain and constipation.  Genitourinary: Negative for dysuria.  Musculoskeletal: Positive for joint pain.  Skin: Negative for rash.  Neurological: Negative for dizziness and headaches.  Psychiatric/Behavioral: Positive for depression and memory loss.     Past Medical History  Diagnosis Date  . Hypertension   . Allergy   . Bipolar 1 disorder   . Dementia   . GERD (gastroesophageal reflux disease)   . Hyponatremia   . Schizoaffective disorder   . Cerebrovascular accident   . Anemia   . Thyroid disease    Past Surgical History  Procedure Laterality Date  . Appendectomy    . Fracture surgery     Social History:   reports that she has been smoking.  She does not have any smokeless tobacco history on file. She reports that she does not drink alcohol or use illicit drugs.  No family history on file.  Medications: Patient's Medications  New Prescriptions   No medications on file  Previous  Medications   ASPIRIN 81 MG EC TABLET    Take 81 mg by mouth daily.     BENAZEPRIL (LOTENSIN) 10 MG TABLET    Take 10 mg by mouth daily.     BUPROPION (WELLBUTRIN) 75 MG TABLET    Take 75 mg by mouth daily.   CALCIUM-VITAMIN D (OSCAL WITH D) 500-200 MG-UNIT PER TABLET    Take 1 tablet by mouth 2 (two) times daily.     CLOPIDOGREL  (PLAVIX) 75 MG TABLET    Take 75 mg by mouth daily.     DIVALPROEX (DEPAKOTE) 250 MG 24 HR TABLET    Take 750 mg by mouth daily.    DONEPEZIL (ARICEPT) 10 MG TABLET    Take 10 mg by mouth at bedtime as needed.     DULOXETINE (CYMBALTA) 20 MG CAPSULE    Take 60 mg by mouth daily.    FUROSEMIDE (LASIX) 20 MG TABLET    Take 20 mg by mouth daily.    HYDROCODONE-ACETAMINOPHEN (NORCO) 5-325 MG PER TABLET    Take 1 tablet by mouth every 4 (four) hours as needed (moderate pain, 2 tabs q 4 hours as needed for severe pain).    LEVOTHYROXINE (SYNTHROID, LEVOTHROID) 75 MCG TABLET    Take 125 mcg by mouth daily.    LISINOPRIL (PRINIVIL,ZESTRIL) 2.5 MG TABLET    Take 2.5 mg by mouth daily.     LORAZEPAM (ATIVAN) 0.5 MG TABLET    Take 1/2 tablet once a day for anxiety. Take 1/2 half tablet twice a day as needed for breakthrough pain.   MENTHOL-ZINC OXIDE (REMEDY CALAZIME) 0.2-20 % PSTE    Apply topically. Apply to buttocks each shift for preventive care after cleansing with normal saline   MULTIPLE VITAMIN (MULTIVITAMIN) TABLET    Take 1 tablet by mouth daily.   PHENOL (CHLORASEPTIC) 1.4 % LIQD    Use as directed 1 spray in the mouth or throat as needed.     POLYETHYLENE GLYCOL POWDER (GLYCOLAX/MIRALAX) POWDER    Take 17 g by mouth daily.   POTASSIUM CHLORIDE (K-DUR) 10 MEQ TABLET    Take 10 mEq by mouth 1 day or 1 dose.     PROPRANOLOL (INDERAL) 10 MG TABLET    Take 10 mg by mouth 2 (two) times daily.     RANITIDINE (ZANTAC) 75 MG TABLET    Take 75 mg by mouth 2 (two) times daily.     RISPERIDONE (RISPERDAL) 0.25 MG TABLET    Take 0.25 mg by mouth daily.   SENNA (SENOKOT) 8.6 MG TABLET    Take 2 tablets by mouth 2 (two) times daily.    SIMVASTATIN (ZOCOR) 20 MG TABLET    Take 40 mg by mouth at bedtime.    SODIUM CHLORIDE 1 G TABLET    Take 1 g by mouth 3 (three) times daily.    VITAMIN D, CHOLECALCIFEROL, 400 UNITS TABLET    Take 400 Units by mouth 2 (two) times daily.    Modified Medications   No  medications on file  Discontinued Medications   No medications on file     Physical Exam:  Filed Vitals:   02/17/13 1335  BP: 115/54  Pulse: 70  Temp: 98.2 F (36.8 C)  Resp: 20  SpO2: 95%   GENERAL APPEARANCE: Alert, conversant. No acute distress.  SKIN: No diaphoresis or rash  HEENT: unreamarkable  NECK: No thyroid tenderness, enlargement or nodule  RESPIRATORY: Breathing is even, unlabored. Lung sounds are  clear  CARDIOVASCULAR: Heart RRR no murmurs, rubs or gallops.  ARTERIAL: radial pulse 2+  GASTROINTESTINAL: Abdomen is soft, non-tender, not distended w/ normal bowel sounds. GENITOURINARY: Bladder non tender, not distended  MUSCULOSKELETAL: No abnormal joints or musculature  NEUROLOGIC: Moves all extremities no tremor.     Labs reviewed: 12/19/12: wbc 5.6, rbc 4.41, hgb 12.4, hct 36.7, plt 222  Sodium 128, potassium 4.2, glucose 84, BUN 12, Cr 0.75  hgb A1c 5.5  Vit D 63 01/18/13: TSH 0.096   Assessment/Plan 1. Hypertension Stable will cont current medications; will followup bmp  2. Cerebrovascular accident Stable at this time.  3. Unspecified hypothyroidism Follow up TSH scheduled  4. Dementia Will start namenda titration to goal of namenda 10 mg BID   5. Bipolar disease, chronic Patient is stable; continue current regimen. Will monitor and make changes as necessary.  6. Anemia Patient is stable; Will monitor and make changes as necessary.  7. Depression Pt remains stable on current medications; currently taking multiple medications for mood at this time will stop Wellbutrin and have staff monitor for increase in depression or behaviors    8. hyperlipidemia Will follow up fasting lipids

## 2013-03-29 NOTE — Progress Notes (Signed)
Patient ID: [name], female   DOB: 04-25-1941, 72 y.o.   MRN: [id number]  [facility]  Allergies  Allergen Reactions  . Demerol   . Codeine   . Dilaudid [Hydromorphone Hcl]   . Elavil [Amitriptyline]   . Flexeril [Cyclobenzaprine Hcl]   . Penicillins     Chief Complaint  Patient presents with  . Medical Managment of Chronic Issues    HPI  She is being seen today for the management of her chronic illnesses. Overall her status is stable. there are no concerns being voiced by the nursing staff. Her sodium level is low at 127; and she is taking nacl 1 gm twice daily this will need to be adjusted. She is not voicing any complaints stating that she is feeling good.   Past Medical History  Diagnosis Date  . Hypertension   . Allergy   . Bipolar 1 disorder   . Dementia   . GERD (gastroesophageal reflux disease)   . Hyponatremia   . Schizoaffective disorder   . Cerebrovascular accident   . Anemia   . Thyroid disease     Past Surgical History  Procedure Laterality Date  . Appendectomy    . Fracture surgery      Filed Vitals:   11/08/12 1643  BP: 130/80  Pulse: 88  Height: 5\' 3"  (1.6 m)  Weight: 219 lb (99.338 kg)    MEDICATIONS risperdal 0.25 mg nightly Senna 2 tabs twice daily zocor 40 mg dialy Nacl 1 gm twice daily Synthroid 150 mcg daily systane both eyes twice daily Vit d 2000 units daily wellbutrin xl 75 mg daily Lisinopril 2.5 mg daily asa 81 mg daily Ativan 0.25 mg daily and twice daily as needed cymbalta 6 mg daily depateko er 750 mg daily aricept 10 mg daily vicodin 5/325 mg 1-2 tabs every 4 hours as needed Lasix 20 mg daily miralax daliy Ca++ 500/200 twice daily plavix 75 mg daily Propranolol 10 mg twice daily Zantac 150 mg daily    SIGNIFICANT STUDIES:  09-24-12: chest x-ray: no acute disease 10-21-12: left 5th finger x-ray: negative for fracture   LABS REVIEWED: 09-19-12: liver normal albumin 3.7; depkoate 61.6  09-26-12: wbc 4.6; hgb  11.4; hct 33.0; mcv 83.3;plt 140; glucose 85; bun 13; crea t0.71; k+4.0; na++125 tsh 0.308;   10-03-12:glucose 91; bun 9; creat 0.76; k+4.2; na++ 127; free t4: 1.84;  hgb a1c 5.2 bnp 36.3  ASSESSMENT/PLAN  1. Hypertension: is stable will continue lisinopril 2.5 mg daily; propranolol 10 mg twice daily and will monitor   2. Cva: is without change in status; will continue plavix 75 mg daily; asa 81 mg daily and will monitor   3. Hypothyroidism: is stable will continue synthroid 150 mcg daily  4. Vascular dementia: no change in status will continue aricept 10 mg daily  5. Dyslipidemia: will continue zocor 40 mg daily  6. Hyponatremia: her sodium remains low; will increase her nacl to 1 gm tid and will check bmp in 2 weeks.   7. Constipation: will continue senna 2 tabs twice daily; miralax daily   8. Edema: is stable will continue lasix 20 mg daily and will monitor   9. Gerd: will continue zantac 150 mg daily  10. Bipolar disorder: she is on a complex regimen on which she is presently stable; will continue her riserpdal 0.25 mg nightly; wellbutrin xl 75 mg daily; ativan 0.25 mg daily and twice daly as needed for anxiety; cymbalta 60 mg daily; an depakote er 750  mg daily   11. Chronic pain: her pain is being managed with vicodin 5/325 mg 1-2 tabs every 4 hours as needed and takes cymbalta 60 mg daily for her chronic pain

## 2013-04-10 ENCOUNTER — Encounter: Payer: Self-pay | Admitting: Nurse Practitioner

## 2013-04-10 ENCOUNTER — Non-Acute Institutional Stay (SKILLED_NURSING_FACILITY): Payer: Medicare Other | Admitting: Nurse Practitioner

## 2013-04-10 DIAGNOSIS — I10 Essential (primary) hypertension: Secondary | ICD-10-CM

## 2013-04-10 DIAGNOSIS — E785 Hyperlipidemia, unspecified: Secondary | ICD-10-CM

## 2013-04-10 DIAGNOSIS — E039 Hypothyroidism, unspecified: Secondary | ICD-10-CM

## 2013-04-10 DIAGNOSIS — I639 Cerebral infarction, unspecified: Secondary | ICD-10-CM

## 2013-04-10 DIAGNOSIS — F0390 Unspecified dementia without behavioral disturbance: Secondary | ICD-10-CM

## 2013-04-10 DIAGNOSIS — F319 Bipolar disorder, unspecified: Secondary | ICD-10-CM

## 2013-04-10 DIAGNOSIS — I6350 Cerebral infarction due to unspecified occlusion or stenosis of unspecified cerebral artery: Secondary | ICD-10-CM

## 2013-04-10 NOTE — Progress Notes (Signed)
Patient ID: MATIKA BARTELL, female   DOB: 1941-04-15, 72 y.o.   MRN: [id number]    Nursing Home Location:  [facility] [place]   Place of Service: SNF (31)  PCP: Kimber Relic, MD  Allergies  Allergen Reactions  . Demerol   . Codeine   . Dilaudid [Hydromorphone Hcl]   . Elavil [Amitriptyline]   . Flexeril [Cyclobenzaprine Hcl]   . Penicillins     Chief Complaint  Patient presents with  . Medical Managment of Chronic Issues    HPI:  72 yo morbidly obese white female with h/o depression, hypothyroidism, dementia, chronic pain, constipation and vitamin D deficiency was seen today for routine follow up of chronic diseases. Pt is without any new complaints and staff without concerns.  Wellbutrin was stopped at last visit; pt tolerated well, no increase in anxiety or behavior changes per staff.  HYPOTHYROIDISM The hypothyroidism is stable.No complications noted from the medication presently being used. takes synthroid 125 mcg daily  HYPERLIPIDEMIA The patient's most recent LDL was near goal.No complications noted from the medication presently being used.takes zocor 40 mg daily  DEMENTIA, VASCULAR, UNCOMPLICATED The Alzheimer's disease symptoms have not changed. The patient has had little change in behavior.No complications noted from the medication presently being used.takes aricept 10 mg daily and namenda to mg  BID  BIPOLAR AFFECTIVE DISORDER is without change; takes ativan 0.25 mg daily and 0.25 mg twice daily as needed; takes cymbalta 30 mg twice daily takes risperdal 0.25 mg twice daily is taking depakote 750 mg in the pm to stabilize mood. is followed by psych services  HTN UNSPECIFIED . No complications noted from the medication presently being used.takes propranolol 10 mg twice daily, takes zestril 2.5 mg daily  CVA, LATE EFFECT The neurological status has not changed.No complications noted from the medication presently being used. takes asa 81 mg daily takes plavix  daily  GERD The patient's dyspeptic symptoms remain stable.No complications noted from the medication presently being used. takes zantac 150 mg twice daily  CONSTIPATION The symptoms are stable.The medication is well tolerated; takes senna 2 tabs twice daily  EDEMA The edema is stable.No complications noted from the medication presently being used. takes lasix 20 mg daily, takes k+ 10 meq daily;   Review of Systems:  Review of Systems  Constitutional: Negative for fever, chills, weight loss and malaise/fatigue.  Eyes: Negative for blurred vision.  Respiratory: Positive for shortness of breath. Negative for cough.        Chronically short of breath  Cardiovascular: Positive for leg swelling. Negative for chest pain and palpitations.  Gastrointestinal: Negative for abdominal pain and constipation.  Genitourinary: Negative for dysuria.  Musculoskeletal: Positive for joint pain.  Skin: Negative for rash.  Neurological: Negative for dizziness and headaches.  Psychiatric/Behavioral: Positive for depression and memory loss.     Past Medical History  Diagnosis Date  . Hypertension   . Allergy   . Bipolar 1 disorder   . Dementia   . GERD (gastroesophageal reflux disease)   . Hyponatremia   . Schizoaffective disorder   . Cerebrovascular accident   . Anemia   . Thyroid disease    Past Surgical History  Procedure Laterality Date  . Appendectomy    . Fracture surgery     Social History:   reports that she has been smoking.  She does not have any smokeless tobacco history on file. She reports that she does not drink alcohol or use illicit drugs.  No family history on file.  Medications: Patient's Medications  New Prescriptions   No medications on file  Previous Medications   ASPIRIN 81 MG EC TABLET    Take 81 mg by mouth daily.     BENAZEPRIL (LOTENSIN) 10 MG TABLET    Take 10 mg by mouth daily.     CALCIUM-VITAMIN D (OSCAL WITH D) 500-200 MG-UNIT PER TABLET    Take 1 tablet by  mouth 2 (two) times daily.     CLOPIDOGREL (PLAVIX) 75 MG TABLET    Take 75 mg by mouth daily.     DIVALPROEX (DEPAKOTE) 250 MG 24 HR TABLET    Take 750 mg by mouth daily.    DONEPEZIL (ARICEPT) 10 MG TABLET    Take 10 mg by mouth at bedtime as needed.     DULOXETINE (CYMBALTA) 20 MG CAPSULE    Take 60 mg by mouth daily.    FUROSEMIDE (LASIX) 20 MG TABLET    Take 20 mg by mouth daily.    HYDROCODONE-ACETAMINOPHEN (NORCO) 5-325 MG PER TABLET    Take 1 tablet by mouth every 4 (four) hours as needed (moderate pain, 2 tabs q 4 hours as needed for severe pain).    LEVOTHYROXINE (SYNTHROID, LEVOTHROID) 75 MCG TABLET    Take 125 mcg by mouth daily.    LISINOPRIL (PRINIVIL,ZESTRIL) 2.5 MG TABLET    Take 2.5 mg by mouth daily.     LORAZEPAM (ATIVAN) 0.5 MG TABLET    Take 1/2 tablet once a day for anxiety. Take 1/2 half tablet twice a day as needed for breakthrough pain.   MEMANTINE (NAMENDA) 10 MG TABLET    Take 10 mg by mouth 2 (two) times daily.   MENTHOL-ZINC OXIDE (REMEDY CALAZIME) 0.2-20 % PSTE    Apply topically. Apply to buttocks each shift for preventive care after cleansing with normal saline   MULTIPLE VITAMIN (MULTIVITAMIN) TABLET    Take 1 tablet by mouth daily.   PHENOL (CHLORASEPTIC) 1.4 % LIQD    Use as directed 1 spray in the mouth or throat as needed.     POLYETHYLENE GLYCOL POWDER (GLYCOLAX/MIRALAX) POWDER    Take 17 g by mouth daily.   POTASSIUM CHLORIDE (K-DUR) 10 MEQ TABLET    Take 10 mEq by mouth 1 day or 1 dose.     PROPRANOLOL (INDERAL) 10 MG TABLET    Take 10 mg by mouth 2 (two) times daily.     RANITIDINE (ZANTAC) 75 MG TABLET    Take 75 mg by mouth 2 (two) times daily.     RISPERIDONE (RISPERDAL) 0.25 MG TABLET    Take 0.25 mg by mouth daily.   SENNA (SENOKOT) 8.6 MG TABLET    Take 2 tablets by mouth 2 (two) times daily.    SIMVASTATIN (ZOCOR) 20 MG TABLET    Take 40 mg by mouth at bedtime.    VITAMIN D, CHOLECALCIFEROL, 400 UNITS TABLET    Take 400 Units by mouth 2 (two) times  daily.    Modified Medications   No medications on file  Discontinued Medications   BUPROPION (WELLBUTRIN) 75 MG TABLET    Take 75 mg by mouth daily.   SODIUM CHLORIDE 1 G TABLET    Take 1 g by mouth 3 (three) times daily.      Physical Exam:  Filed Vitals:   04/10/13 1433  BP: 122/60  Pulse: 62  Temp: 97.3 F (36.3 C)  Resp: 20    GENERAL APPEARANCE:  Alert, conversant. No acute distress.  SKIN: No diaphoresis or rash  HEENT: unreamarkable  NECK: No thyroid tenderness, enlargement or nodule  RESPIRATORY: Breathing is even, unlabored. Lung sounds are clear  CARDIOVASCULAR: Heart RRR no murmurs, rubs or gallops. Trace edema ARTERIAL: radial pulse 2+  GASTROINTESTINAL: Abdomen is soft, non-tender, not distended w/ normal bowel sounds. GENITOURINARY: Bladder non tender, not distended  MUSCULOSKELETAL: No abnormal joints or musculature  Left upper ext hemiparesis  NEUROLOGIC: oriented x 1.    Labs reviewed: 12/19/12: wbc 5.6, rbc 4.41, hgb 12.4, hct 36.7, plt 222  Sodium 128, potassium 4.2, glucose 84, BUN 12, Cr 0.75  hgb A1c 5.5  Vit D 63  01/18/13: TSH 0.096 BMP with Estimated GFR    Result: 03/04/2013 4:00 PM   ( Status: F )       Sodium 127   L 135-145 mEq/L SLN   Potassium 4.2     3.5-5.3 mEq/L SLN   Chloride 93   L 96-112 mEq/L SLN   CO2 28     19-32 mEq/L SLN   Glucose 96     70-99 mg/dL SLN   BUN 6     1-61 mg/dL SLN   Creatinine 0.96     0.50-1.10 mg/dL SLN   Calcium 9.5     0.4-54.0 mg/dL SLN   Est GFR, African American >89      mL/min SLN   Est GFR, NonAfrican American 86      mL/min SLN C Lipid Profile    Result: 03/04/2013 4:00 PM   ( Status: F )       Cholesterol 126     0-200 mg/dL SLN C Triglyceride 79     <150 mg/dL SLN   HDL Cholesterol 42     >39 mg/dL SLN   Total Chol/HDL Ratio 3.0      Ratio SLN   VLDL Cholesterol (Calc) 16     0-40 mg/dL SLN   LDL Cholesterol (Calc) 68     0-99 mg/dL SLN C TSH, Ultrasensitive    Result: 03/04/2013 4:10 PM   (  Status: F )       TSH 0.826     0.350-4.500 uIU/mL SLN BMP with Estimated GFR    Result: 03/08/2013 11:40 AM   ( Status: F )     C Sodium 129   L 135-145 mEq/L SLN   Potassium 4.3     3.5-5.3 mEq/L SLN   Chloride 91   L 96-112 mEq/L SLN   CO2 30     19-32 mEq/L SLN   Glucose 83     70-99 mg/dL SLN   BUN 7     9-81 mg/dL SLN   Creatinine 1.91     0.50-1.10 mg/dL SLN   Calcium 8.8     4.7-82.9 mg/dL SLN   Est GFR, African American 88      mL/min SLN   Est GFR, NonAfrican American 77  Assessment/Plan 1. Unspecified hypothyroidism -stable on most recent labs; conts on 75 mcg of synthroid  2. Dementia without behavioral disturbance -stable on current medications -will change namenda to 28 mg XR dosing from BID   3. Hypertension Patient is stable; continue current regimen. Will monitor and make changes as necessary.  4. Cerebrovascular accident Left sided upper ext hemiparesis; conts on plavix, asa, and zocor  5. Bipolar disease, chronic No changes in behavior with dc of Wellbutrin; will cont current medications at this time  6. Other and unspecified  hyperlipidemia LDL at goal on most recent labs; will cont zocor  7. Hyponatremia Sodium tablets have been dc's- recheck BMP was unchanged off sodium; will cont to monitor    Labs/tests ordered

## 2013-04-21 ENCOUNTER — Other Ambulatory Visit: Payer: Self-pay | Admitting: *Deleted

## 2013-04-21 MED ORDER — LORAZEPAM 0.5 MG PO TABS: ORAL_TABLET | ORAL | Status: DC

## 2013-05-02 ENCOUNTER — Encounter: Payer: Self-pay | Admitting: Internal Medicine

## 2013-05-02 ENCOUNTER — Non-Acute Institutional Stay (SKILLED_NURSING_FACILITY): Payer: Medicare Other | Admitting: Internal Medicine

## 2013-05-02 DIAGNOSIS — J111 Influenza due to unidentified influenza virus with other respiratory manifestations: Secondary | ICD-10-CM

## 2013-05-02 NOTE — Progress Notes (Signed)
Patient ID: [name], female   DOB: 27-Jun-1940, 72 y.o.   MRN: [id number]  Location:   [facility] Provider:  Gwenith Spitz. Renato Gails, D.O., C.M.D.  Code Status:  Full code  Chief Complaint  Patient presents with  . Acute Visit    cough, congestion    HPI:  72 yo white female with h/o obesity,bipolar disorder, vascular dementia, hypothyroidism, hyperlipidemia seen for AV due to cough, congestion.  When seen, she c/o cough with sputum production, myalgias, and overall feeling bad.  She was coughing nonstop.    Review of Systems:  Review of Systems  Constitutional: Positive for malaise/fatigue. Negative for fever and chills.  HENT: Positive for congestion.   Respiratory: Positive for cough, sputum production, shortness of breath and wheezing.   Cardiovascular: Negative for chest pain and leg swelling.  Gastrointestinal: Negative for abdominal pain and diarrhea.  Musculoskeletal: Positive for myalgias.  Neurological: Positive for weakness.  Psychiatric/Behavioral: Positive for depression and memory loss.    Medications: Patient's Medications  New Prescriptions   No medications on file  Previous Medications   ASPIRIN 81 MG EC TABLET    Take 81 mg by mouth daily.     BENAZEPRIL (LOTENSIN) 10 MG TABLET    Take 10 mg by mouth daily.     CALCIUM-VITAMIN D (OSCAL WITH D) 500-200 MG-UNIT PER TABLET    Take 1 tablet by mouth 2 (two) times daily.     CLOPIDOGREL (PLAVIX) 75 MG TABLET    Take 75 mg by mouth daily.     DIVALPROEX (DEPAKOTE) 250 MG 24 HR TABLET    Take 750 mg by mouth daily.    DONEPEZIL (ARICEPT) 10 MG TABLET    Take 10 mg by mouth at bedtime as needed.     DULOXETINE (CYMBALTA) 20 MG CAPSULE    Take 60 mg by mouth daily.    FUROSEMIDE (LASIX) 20 MG TABLET    Take 20 mg by mouth daily.    HYDROCODONE-ACETAMINOPHEN (NORCO) 5-325 MG PER TABLET    Take 1 tablet by mouth every 4 (four) hours as needed (moderate pain, 2 tabs q 4 hours as needed for severe pain).    LEVOTHYROXINE (SYNTHROID, LEVOTHROID) 75 MCG TABLET    Take 125 mcg by mouth daily.    LISINOPRIL (PRINIVIL,ZESTRIL) 2.5 MG TABLET    Take 2.5 mg by mouth daily.     LORAZEPAM (ATIVAN) 0.5 MG TABLET    Take 1/2 tablet by mouth once daily for anxiety; Take 1/2 tablet by mouth twice daily as needed for breakthrough pain   MEMANTINE (NAMENDA) 10 MG TABLET    Take 10 mg by mouth 2 (two) times daily.   MENTHOL-ZINC OXIDE (REMEDY CALAZIME) 0.2-20 % PSTE    Apply topically. Apply to buttocks each shift for preventive care after cleansing with normal saline   MULTIPLE VITAMIN (MULTIVITAMIN) TABLET    Take 1 tablet by mouth daily.   PHENOL (CHLORASEPTIC) 1.4 % LIQD    Use as directed 1 spray in the mouth or throat as needed.     POLYETHYLENE GLYCOL POWDER (GLYCOLAX/MIRALAX) POWDER    Take 17 g by mouth daily.   POTASSIUM CHLORIDE (K-DUR) 10 MEQ TABLET    Take 10 mEq by mouth 1 day or 1 dose.     PROPRANOLOL (INDERAL) 10 MG TABLET    Take 10 mg by mouth 2 (two) times daily.     RANITIDINE (ZANTAC) 75 MG TABLET    Take 75  mg by mouth 2 (two) times daily.     RISPERIDONE (RISPERDAL) 0.25 MG TABLET    Take 0.25 mg by mouth daily.   SENNA (SENOKOT) 8.6 MG TABLET    Take 2 tablets by mouth 2 (two) times daily.    SIMVASTATIN (ZOCOR) 20 MG TABLET    Take 40 mg by mouth at bedtime.    VITAMIN D, CHOLECALCIFEROL, 400 UNITS TABLET    Take 400 Units by mouth 2 (two) times daily.    Modified Medications   No medications on file  Discontinued Medications   No medications on file    Physical Exam: Filed Vitals:   05/02/13 1822  BP: 131/71  Pulse: 70  Temp: 97.2 F (36.2 C)  Resp: 20   Physical Exam  Constitutional:  Obese white female, agitated  HENT:  Head: Normocephalic and atraumatic.  Neck: Neck supple. No JVD present.  Cardiovascular: Normal rate, regular rhythm, normal heart sounds and intact distal pulses.   Pulmonary/Chest: Effort normal.  Coarse wet rhonchi anteriorly  Abdominal: Soft. Bowel  sounds are normal. She exhibits no distension. There is no tenderness.  Lymphadenopathy:    She has no cervical adenopathy.  Neurological: She is alert.   Labs reviewed: 03/08/13:  Na 129, K 4.3, BUN 7, cr 0.78 03/05/13:  TSH 0.826  Assessment/Plan 1. Influenza-like illness influenza a and b swab stat--flu swabs were negative pcxr stat Vs q shift wa x 72 hr with pox Droplet precautions with isolation x 5 days Cbc bmp stat Push po fluids x 72 hrs If intake poor, hold lasix and lisinopril mucinex 600mg  po q12h x 5 days duonebs q6hrs prn chest congestion x 5 days

## 2013-05-24 ENCOUNTER — Non-Acute Institutional Stay (SKILLED_NURSING_FACILITY): Payer: Medicare Other | Admitting: Internal Medicine

## 2013-05-24 DIAGNOSIS — F0390 Unspecified dementia without behavioral disturbance: Secondary | ICD-10-CM

## 2013-05-24 DIAGNOSIS — F319 Bipolar disorder, unspecified: Secondary | ICD-10-CM

## 2013-05-24 DIAGNOSIS — J111 Influenza due to unidentified influenza virus with other respiratory manifestations: Secondary | ICD-10-CM

## 2013-05-24 DIAGNOSIS — R69 Illness, unspecified: Principal | ICD-10-CM

## 2013-05-24 NOTE — Progress Notes (Signed)
Patient ID: [name], female   DOB: 06/16/1940, 73 y.o.   MRN: [id number]  [facility]  [facility] Provider:  Gwenith Spitziffany L. Renato Gailseed, D.O., C.M.D.   Chief Complaint  Patient presents with  . Acute Visit    yelling out, shouting for medications    HPI:  73 yo white female just completed her tamiflu treatment and is now agitated, yelling out, shouting for medications not behaving like her normal self.  Review of Systems:  Review of Systems  Constitutional: Negative for fever and malaise/fatigue.  HENT: Negative for congestion.   Eyes: Negative for blurred vision.  Respiratory: Negative for shortness of breath.   Cardiovascular: Negative for chest pain.  Gastrointestinal: Negative for constipation.  Genitourinary: Negative for dysuria.  Musculoskeletal: Positive for myalgias.  Skin: Negative for rash.  Neurological: Negative for dizziness and headaches.  Psychiatric/Behavioral: Positive for memory loss.    Medications: Patient's Medications  New Prescriptions   No medications on file  Previous Medications   ASPIRIN 81 MG EC TABLET    Take 81 mg by mouth daily.     BENAZEPRIL (LOTENSIN) 10 MG TABLET    Take 10 mg by mouth daily.     CALCIUM-VITAMIN D (OSCAL WITH D) 500-200 MG-UNIT PER TABLET    Take 1 tablet by mouth 2 (two) times daily.     CLOPIDOGREL (PLAVIX) 75 MG TABLET    Take 75 mg by mouth daily.     DIVALPROEX (DEPAKOTE) 250 MG 24 HR TABLET    Take 750 mg by mouth daily.    DONEPEZIL (ARICEPT) 10 MG TABLET    Take 10 mg by mouth at bedtime as needed.     DULOXETINE (CYMBALTA) 20 MG CAPSULE    Take 60 mg by mouth daily.    FUROSEMIDE (LASIX) 20 MG TABLET    Take 20 mg by mouth daily.    HYDROCODONE-ACETAMINOPHEN (NORCO) 5-325 MG PER TABLET    Take 1 tablet by mouth every 4 (four) hours as needed (moderate pain, 2 tabs q 4 hours as needed for severe pain).    LEVOTHYROXINE (SYNTHROID, LEVOTHROID) 75 MCG TABLET    Take 125 mcg by mouth daily.    LISINOPRIL  (PRINIVIL,ZESTRIL) 2.5 MG TABLET    Take 2.5 mg by mouth daily.     LORAZEPAM (ATIVAN) 0.5 MG TABLET    Take 1/2 tablet by mouth once daily for anxiety; Take 1/2 tablet by mouth twice daily as needed for breakthrough pain   MEMANTINE (NAMENDA) 10 MG TABLET    Take 10 mg by mouth 2 (two) times daily.   MENTHOL-ZINC OXIDE (REMEDY CALAZIME) 0.2-20 % PSTE    Apply topically. Apply to buttocks each shift for preventive care after cleansing with normal saline   MULTIPLE VITAMIN (MULTIVITAMIN) TABLET    Take 1 tablet by mouth daily.   PHENOL (CHLORASEPTIC) 1.4 % LIQD    Use as directed 1 spray in the mouth or throat as needed.     POLYETHYLENE GLYCOL POWDER (GLYCOLAX/MIRALAX) POWDER    Take 17 g by mouth daily.   POTASSIUM CHLORIDE (K-DUR) 10 MEQ TABLET    Take 10 mEq by mouth 1 day or 1 dose.     PROPRANOLOL (INDERAL) 10 MG TABLET    Take 10 mg by mouth 2 (two) times daily.     RANITIDINE (ZANTAC) 75 MG TABLET    Take 75 mg by mouth 2 (two) times daily.     RISPERIDONE (RISPERDAL) 0.25 MG TABLET  Take 0.25 mg by mouth daily.   SENNA (SENOKOT) 8.6 MG TABLET    Take 2 tablets by mouth 2 (two) times daily.    SIMVASTATIN (ZOCOR) 20 MG TABLET    Take 40 mg by mouth at bedtime.    VITAMIN D, CHOLECALCIFEROL, 400 UNITS TABLET    Take 400 Units by mouth 2 (two) times daily.    Modified Medications   No medications on file  Discontinued Medications   No medications on file    Physical Exam:  Physical Exam  Constitutional: She appears well-developed and well-nourished. No distress.  Cardiovascular: Normal rate, regular rhythm, normal heart sounds and intact distal pulses.   Pulmonary/Chest: Effort normal and breath sounds normal. No respiratory distress.  Abdominal: Soft. Bowel sounds are normal. She exhibits no distension and no mass. There is no tenderness.  Musculoskeletal: Normal range of motion.  Neurological: She is alert.  Skin: Skin is warm and dry.  Psychiatric: She has a normal mood and  affect.    Assessment/Plan 1. Influenza-like illness -has completed tamiflu, but remains agitated and confused and the medication is likely why -will check cbc, bmp, ua c+s  2. Dementia without behavioral disturbance -acutely worsened with her illness and medication changes  3. Bipolar disease, chronic -I will not change meds--suspect tamiflu is cause   Family/ staff Communication: discussed with nurses  Goals of care: long term care  Labs/tests ordered: cbc, bmp, ua c+s

## 2013-05-25 ENCOUNTER — Encounter: Payer: Self-pay | Admitting: Internal Medicine

## 2013-05-25 ENCOUNTER — Non-Acute Institutional Stay (SKILLED_NURSING_FACILITY): Payer: Medicare Other | Admitting: Internal Medicine

## 2013-05-25 DIAGNOSIS — E871 Hypo-osmolality and hyponatremia: Secondary | ICD-10-CM

## 2013-05-25 NOTE — Assessment & Plan Note (Addendum)
Long hx of this , has been on salt pills in the past and maintained sodium for a little while but it is low again on todays lab-Na 124; will restart NACl tabs 1 gm TID with meals.

## 2013-05-25 NOTE — Progress Notes (Signed)
MRN: [id number] Name: [name]  Sex: female Age: 73 y.o. DOB: [birth year]  PSC #: Ronni Rumble Facility/Room: 211B Level Of Care: SNF Provider: Merrilee Seashore D Emergency Contacts: Extended Emergency Contact Information Primary Emergency Contact: Brewer,[address], [place] [address] [place] Home Phone: [phone] Mobile Phone: [phone] Relation: Son  Code Status: FULL  Allergies: Demerol; Codeine; Dilaudid; Elavil; Flexeril; and Penicillins  Chief Complaint  Patient presents with  . Acute Visit    HPI: Patient is 73 y.o. female who has a low sodium on routine lab.  Past Medical History  Diagnosis Date  . Hypertension   . Allergy   . Bipolar 1 disorder   . Dementia   . GERD (gastroesophageal reflux disease)   . Hyponatremia   . Schizoaffective disorder   . Cerebrovascular accident   . Anemia   . Thyroid disease     Past Surgical History  Procedure Laterality Date  . Appendectomy    . Fracture surgery        Medication List       This list is accurate as of: 05/25/13  5:00 PM.  Always use your most recent med list.               aspirin 81 MG EC tablet  Take 81 mg by mouth daily.     benazepril 10 MG tablet  Commonly known as:  LOTENSIN  Take 10 mg by mouth daily.     calcium-vitamin D 500-200 MG-UNIT per tablet  Commonly known as:  OSCAL WITH D  Take 1 tablet by mouth 2 (two) times daily.     CHLORASEPTIC 1.4 % Liqd  Generic drug:  phenol  Use as directed 1 spray in the mouth or throat as needed.     clopidogrel 75 MG tablet  Commonly known as:  PLAVIX  Take 75 mg by mouth daily.     divalproex 250 MG 24 hr tablet  Commonly known as:  DEPAKOTE ER  Take 750 mg by mouth daily.     donepezil 10 MG tablet  Commonly known as:  ARICEPT  Take 10 mg by mouth at bedtime as needed.     DULoxetine 20 MG capsule  Commonly known as:  CYMBALTA  Take 60 mg by mouth daily.     furosemide 20 MG tablet  Commonly  known as:  LASIX  Take 20 mg by mouth daily.     HYDROcodone-acetaminophen 5-325 MG per tablet  Commonly known as:  NORCO/VICODIN  Take 1 tablet by mouth every 4 (four) hours as needed (moderate pain, 2 tabs q 4 hours as needed for severe pain).     levothyroxine 75 MCG tablet  Commonly known as:  SYNTHROID, LEVOTHROID  Take 125 mcg by mouth daily.     lisinopril 2.5 MG tablet  Commonly known as:  PRINIVIL,ZESTRIL  Take 2.5 mg by mouth daily.     LORazepam 0.5 MG tablet  Commonly known as:  ATIVAN  Take 1/2 tablet by mouth once daily for anxiety; Take 1/2 tablet by mouth twice daily as needed for breakthrough pain     multivitamin tablet  Take 1 tablet by mouth daily.     NAMENDA 10 MG tablet  Generic drug:  memantine  Take 10 mg by mouth 2 (two) times daily.     polyethylene glycol powder powder  Commonly known as:  GLYCOLAX/MIRALAX  Take 17 g by mouth daily.  potassium chloride 10 MEQ tablet  Commonly known as:  K-DUR  Take 10 mEq by mouth 1 day or 1 dose.     propranolol 10 MG tablet  Commonly known as:  INDERAL  Take 10 mg by mouth 2 (two) times daily.     ranitidine 75 MG tablet  Commonly known as:  ZANTAC  Take 75 mg by mouth 2 (two) times daily.     REMEDY CALAZIME 0.2-20 % Pste  Generic drug:  Menthol-Zinc Oxide  Apply topically. Apply to buttocks each shift for preventive care after cleansing with normal saline     risperiDONE 0.25 MG tablet  Commonly known as:  RISPERDAL  Take 0.25 mg by mouth daily.     senna 8.6 MG tablet  Commonly known as:  SENOKOT  Take 2 tablets by mouth 2 (two) times daily.     simvastatin 20 MG tablet  Commonly known as:  ZOCOR  Take 40 mg by mouth at bedtime.     vitamin D (CHOLECALCIFEROL) 400 UNITS tablet  Take 400 Units by mouth 2 (two) times daily.        No orders of the defined types were placed in this encounter.    Immunization History  Administered Date(s) Administered  . Influenza Whole 02/27/2013   . Pneumococcal-Unspecified 10/06/2011    History  Substance Use Topics  . Smoking status: Current Every Day Smoker -- 1.00 packs/day  . Smokeless tobacco: Not on file  . Alcohol Use: No    Review of Systems  DATA OBTAINED: from patient GENERAL: Feels well no fevers, fatigue, appetite changes SKIN: No itching, rash HEENT: No complaint RESPIRATORY: No cough, wheezing, SOB CARDIAC: No chest pain, palpitations, lower extremity edema  GI: No abdominal pain, No N/V/D or constipation, No heartburn or reflux  GU: No dysuria, frequency or urgency, or incontinence  MUSCULOSKELETAL: No unrelieved bone/joint pain NEUROLOGIC: No headache, dizziness or new focal weakness PSYCHIATRIC: No overt anxiety or sadness. Sleeps well.   Filed Vitals:   05/25/13 1620  BP: 163/88  Pulse: 66  Temp: 98.5 F (36.9 C)  Resp: 18    Physical Exam  GENERAL APPEARANCE: Alert, conversant. Appropriately groomed. No acute distress  SKIN: No diaphoresis rash HEENT: Unremarkable RESPIRATORY: Breathing is even, unlabored. Lung sounds are clear   CARDIOVASCULAR: Heart RRR no murmurs, rubs or gallops. No peripheral edema  GASTROINTESTINAL: Abdomen is soft, non-tender, not distended w/ normal bowel sounds.  GENITOURINARY: Bladder non tender, not distended  MUSCULOSKELETAL: No abnormal joints or musculature NEUROLOGIC: Cranial nerves 2-12 grossly intact; L side paresis PSYCHIATRIC: Mood and affect appropriate to situation, no behavioral issues  Patient Active Problem List   Diagnosis Date Noted  . Hyponatremia 05/25/2013  . Depression 02/17/2013  . Hypertension   . Dementia without behavioral disturbance   . Anemia   . Bipolar 1 disorder   . Cerebrovascular accident   . Unspecified hypothyroidism 09/23/2012  . Other and unspecified hyperlipidemia 09/23/2012  . History of stroke with residual effects 11/26/2010  . Bipolar disease, chronic 11/26/2010  . Appendicitis 11/26/2010     BMP 05/25/2012     124, 4.5, 93, 24, 12/0.70 , 99.Marland Kitchen.Marland Kitchen.GFR 87.5   Assessment and Plan  Hyponatremia Long hx of this , has been on salt pills in the past and maintained sodium for a little while but it is low again on todays lab-Na 124; will restart NACl tabs 1 gm TID with meals.    Margit HanksALEXANDER, Adenike Shidler D, MD

## 2013-07-24 ENCOUNTER — Emergency Department (HOSPITAL_COMMUNITY): Payer: Medicare Other

## 2013-07-24 ENCOUNTER — Emergency Department (HOSPITAL_COMMUNITY)
Admission: EM | Admit: 2013-07-24 | Discharge: 2013-07-24 | Disposition: A | Discharge status: Home / Self Care | Payer: Medicare Other | Attending: Emergency Medicine | Admitting: Emergency Medicine

## 2013-07-24 ENCOUNTER — Encounter (HOSPITAL_COMMUNITY): Payer: Self-pay | Admitting: Emergency Medicine

## 2013-07-24 DIAGNOSIS — I10 Essential (primary) hypertension: Secondary | ICD-10-CM | POA: Insufficient documentation

## 2013-07-24 DIAGNOSIS — Z8673 Personal history of transient ischemic attack (TIA), and cerebral infarction without residual deficits: Secondary | ICD-10-CM | POA: Insufficient documentation

## 2013-07-24 DIAGNOSIS — K219 Gastro-esophageal reflux disease without esophagitis: Secondary | ICD-10-CM | POA: Insufficient documentation

## 2013-07-24 DIAGNOSIS — F0390 Unspecified dementia without behavioral disturbance: Secondary | ICD-10-CM

## 2013-07-24 DIAGNOSIS — Z7982 Long term (current) use of aspirin: Secondary | ICD-10-CM | POA: Insufficient documentation

## 2013-07-24 DIAGNOSIS — F03918 Unspecified dementia, unspecified severity, with other behavioral disturbance: Secondary | ICD-10-CM | POA: Insufficient documentation

## 2013-07-24 DIAGNOSIS — Z880 Allergy status to penicillin: Secondary | ICD-10-CM | POA: Insufficient documentation

## 2013-07-24 DIAGNOSIS — F0391 Unspecified dementia with behavioral disturbance: Secondary | ICD-10-CM | POA: Insufficient documentation

## 2013-07-24 DIAGNOSIS — E871 Hypo-osmolality and hyponatremia: Secondary | ICD-10-CM | POA: Insufficient documentation

## 2013-07-24 DIAGNOSIS — Z79899 Other long term (current) drug therapy: Secondary | ICD-10-CM | POA: Insufficient documentation

## 2013-07-24 DIAGNOSIS — F17200 Nicotine dependence, unspecified, uncomplicated: Secondary | ICD-10-CM | POA: Insufficient documentation

## 2013-07-24 DIAGNOSIS — R4689 Other symptoms and signs involving appearance and behavior: Secondary | ICD-10-CM

## 2013-07-24 DIAGNOSIS — E079 Disorder of thyroid, unspecified: Secondary | ICD-10-CM | POA: Insufficient documentation

## 2013-07-24 DIAGNOSIS — F319 Bipolar disorder, unspecified: Secondary | ICD-10-CM | POA: Insufficient documentation

## 2013-07-24 LAB — CBC
HCT: 36.1 % (ref 36.0–46.0)
Hemoglobin: 12.8 g/dL (ref 12.0–15.0)
MCH: 30.3 pg (ref 26.0–34.0)
MCHC: 35.5 g/dL (ref 30.0–36.0)
MCV: 85.5 fL (ref 78.0–100.0)
PLATELETS: 220 10*3/uL (ref 150–400)
RBC: 4.22 MIL/uL (ref 3.87–5.11)
RDW: 13.1 % (ref 11.5–15.5)
WBC: 7.8 10*3/uL (ref 4.0–10.5)

## 2013-07-24 LAB — COMPREHENSIVE METABOLIC PANEL
ALBUMIN: 3.2 g/dL — AB (ref 3.5–5.2)
ALK PHOS: 86 U/L (ref 39–117)
ALT: 9 U/L (ref 0–35)
AST: 18 U/L (ref 0–37)
BUN: 7 mg/dL (ref 6–23)
CHLORIDE: 89 meq/L — AB (ref 96–112)
CO2: 27 mEq/L (ref 19–32)
Calcium: 9.5 mg/dL (ref 8.4–10.5)
Creatinine, Ser: 0.78 mg/dL (ref 0.50–1.10)
GFR calc Af Amer: >90 mL/min (ref >90)
GFR calc non Af Amer: 82 mL/min — ABNORMAL LOW (ref >90)
Glucose, Bld: 119 mg/dL — ABNORMAL HIGH (ref 70–99)
POTASSIUM: 4.6 meq/L (ref 3.7–5.3)
SODIUM: 128 meq/L — AB (ref 137–147)
TOTAL PROTEIN: 7 g/dL (ref 6.0–8.3)
Total Bilirubin: 0.3 mg/dL (ref 0.3–1.2)

## 2013-07-24 LAB — URINALYSIS, ROUTINE W REFLEX MICROSCOPIC
Bilirubin Urine: NEGATIVE
Glucose, UA: NEGATIVE mg/dL
Hgb urine dipstick: NEGATIVE
KETONES UR: NEGATIVE mg/dL
LEUKOCYTES UA: NEGATIVE
NITRITE: NEGATIVE
PROTEIN: NEGATIVE mg/dL
Specific Gravity, Urine: 1.014 (ref 1.005–1.030)
Urobilinogen, UA: 1 mg/dL (ref 0.0–1.0)
pH: 7.5 (ref 5.0–8.0)

## 2013-07-24 LAB — I-STAT TROPONIN, ED: Troponin i, poc: 0.01 ng/mL (ref 0.00–0.08)

## 2013-07-24 LAB — VALPROIC ACID LEVEL: Valproic Acid Lvl: 67.1 ug/mL (ref 50.0–100.0)

## 2013-07-24 NOTE — ED Notes (Signed)
[facility] patient became combative last night, pulling out her hair, hallucinating.  Today she has been calm.  Patient does have an extensive psychiatric background.  Not suicidal or homicidal.

## 2013-07-24 NOTE — ED Notes (Signed)
[facility] Expected date:  Expected time:  Means of arrival:  Comments: ems- 73 yo med clearance

## 2013-07-24 NOTE — ED Provider Notes (Signed)
CSN: [id number]     Arrival date & time 07/24/13  1458 History   First MD Initiated Contact with Patient 07/24/13 1507     Chief Complaint  Patient presents with  . Medical Clearance     (Consider location/radiation/quality/duration/timing/severity/associated sxs/prior Treatment) Patient is a 73 y.o. female presenting with altered mental status. The history is provided by the patient, the EMS personnel and a friend.  Altered Mental Status Presenting symptoms: behavior changes (increasing confusion) and combativeness (threats toward other residents)   Presenting symptoms: no partial responsiveness and no unresponsiveness   Severity:  Mild Most recent episode:  Yesterday Episode history:  Multiple Duration:  1 month Progression:  Waxing and waning Chronicity:  New Context: not drug use, not a recent illness and not a recent infection   Associated symptoms: no abdominal pain and no fever    Level 5 caveat - dementia   Past Medical History  Diagnosis Date  . Hypertension   . Allergy   . Bipolar 1 disorder   . Dementia   . GERD (gastroesophageal reflux disease)   . Hyponatremia   . Schizoaffective disorder   . Cerebrovascular accident   . Anemia   . Thyroid disease    Past Surgical History  Procedure Laterality Date  . Appendectomy    . Fracture surgery     No family history on file. History  Substance Use Topics  . Smoking status: Current Every Day Smoker -- 1.00 packs/day  . Smokeless tobacco: Not on file  . Alcohol Use: No   OB History   Grav Para Term Preterm Abortions TAB SAB Ect Mult Living                 Review of Systems  Unable to perform ROS: Dementia  Constitutional: Negative for fever.  Respiratory: Negative for cough and shortness of breath.   Gastrointestinal: Negative for abdominal pain.  All other systems reviewed and are negative.      Allergies  Demerol; Codeine; Dilaudid; Elavil; Flexeril; and Penicillins  Home Medications   Current  Outpatient Rx  Name  Route  Sig  Dispense  Refill  . aspirin 81 MG EC tablet   Oral   Take 81 mg by mouth daily.           . benazepril (LOTENSIN) 10 MG tablet   Oral   Take 10 mg by mouth daily.           . calcium-vitamin D (OSCAL WITH D) 500-200 MG-UNIT per tablet   Oral   Take 1 tablet by mouth 2 (two) times daily.           . clopidogrel (PLAVIX) 75 MG tablet   Oral   Take 75 mg by mouth daily.           . divalproex (DEPAKOTE) 250 MG 24 hr tablet   Oral   Take 750 mg by mouth daily.          Marland Kitchen. donepezil (ARICEPT) 10 MG tablet   Oral   Take 10 mg by mouth at bedtime as needed.           . DULoxetine (CYMBALTA) 20 MG capsule   Oral   Take 60 mg by mouth daily.          . furosemide (LASIX) 20 MG tablet   Oral   Take 20 mg by mouth daily.          Marland Kitchen. HYDROcodone-acetaminophen (NORCO) 5-325 MG per tablet  Oral   Take 1 tablet by mouth every 4 (four) hours as needed (moderate pain, 2 tabs q 4 hours as needed for severe pain).          Marland Kitchen levothyroxine (SYNTHROID, LEVOTHROID) 75 MCG tablet   Oral   Take 125 mcg by mouth daily.          Marland Kitchen lisinopril (PRINIVIL,ZESTRIL) 2.5 MG tablet   Oral   Take 2.5 mg by mouth daily.           Marland Kitchen LORazepam (ATIVAN) 0.5 MG tablet      Take 1/2 tablet by mouth once daily for anxiety; Take 1/2 tablet by mouth twice daily as needed for breakthrough pain   45 tablet   5   . memantine (NAMENDA) 10 MG tablet   Oral   Take 10 mg by mouth 2 (two) times daily.         . Menthol-Zinc Oxide (REMEDY CALAZIME) 0.2-20 % PSTE   Apply externally   Apply topically. Apply to buttocks each shift for preventive care after cleansing with normal saline         . Multiple Vitamin (MULTIVITAMIN) tablet   Oral   Take 1 tablet by mouth daily.         . phenol (CHLORASEPTIC) 1.4 % LIQD   Mouth/Throat   Use as directed 1 spray in the mouth or throat as needed.           . polyethylene glycol powder (GLYCOLAX/MIRALAX)  powder   Oral   Take 17 g by mouth daily.         . potassium chloride (K-DUR) 10 MEQ tablet   Oral   Take 10 mEq by mouth 1 day or 1 dose.           . propranolol (INDERAL) 10 MG tablet   Oral   Take 10 mg by mouth 2 (two) times daily.           . ranitidine (ZANTAC) 75 MG tablet   Oral   Take 75 mg by mouth 2 (two) times daily.           . risperiDONE (RISPERDAL) 0.25 MG tablet   Oral   Take 0.25 mg by mouth daily.         Marland Kitchen senna (SENOKOT) 8.6 MG tablet   Oral   Take 2 tablets by mouth 2 (two) times daily.          . simvastatin (ZOCOR) 20 MG tablet   Oral   Take 40 mg by mouth at bedtime.          . vitamin D, CHOLECALCIFEROL, 400 UNITS tablet   Oral   Take 400 Units by mouth 2 (two) times daily.            BP 124/71  Pulse 73  Temp(Src) 97.8 F (36.6 C) (Oral)  Resp 20  SpO2 95% Physical Exam  Nursing note and vitals reviewed. Constitutional: She appears well-developed and well-nourished. No distress.  HENT:  Head: Normocephalic and atraumatic.  Eyes: EOM are normal. Pupils are equal, round, and reactive to light.  Neck: Normal range of motion. Neck supple.  Cardiovascular: Normal rate and regular rhythm.  Exam reveals no friction rub.   No murmur heard. Pulmonary/Chest: Effort normal and breath sounds normal. No respiratory distress. She has no wheezes. She has no rales.  Abdominal: Soft. She exhibits no distension. There is no tenderness. There is no rebound.  Musculoskeletal: Normal range  of motion. She exhibits no edema.  Neurological: She is alert. She exhibits normal muscle tone.  Skin: No rash noted. She is not diaphoretic.    ED Course  Procedures (including critical care time) Labs Review Labs Reviewed  COMPREHENSIVE METABOLIC PANEL - Abnormal; Notable for the following:    Sodium 128 (*)    Chloride 89 (*)    Glucose, Bld 119 (*)    Albumin 3.2 (*)    GFR calc non Af Amer 82 (*)    All other components within normal  limits  URINALYSIS, ROUTINE W REFLEX MICROSCOPIC  CBC  VALPROIC ACID LEVEL  I-STAT TROPOININ, ED   Imaging Review Dg Chest 2 View  07/24/2013   CLINICAL DATA:  Altered mental status.  Malaise.  EXAM: CHEST  2 VIEW  COMPARISON:  09/25/2010  FINDINGS: Cardiac silhouette is normal in size and configuration. Normal mediastinal and hilar contours. Clear lungs. No pleural effusion or pneumothorax. Bony thorax is demineralized but grossly intact.  IMPRESSION: No active cardiopulmonary disease.   Electronically Signed   By: Amie Portland M.D.   On: 07/24/2013 17:54     EKG Interpretation None      MDM   Final diagnoses:  Dementia  Behavioral change    42F here with general malaise. Here, demented pleasantly, not oriented. Repeats all questions back to me. Does state lower abdominal pain, cannot clarify or expand on this symptom.  I spoke to her nursing home, who stated she had a negative UA yesterday. She was seeing things and hearing things last night.  This is not normal for her and she doesn't need regular Psychiatric tune-ups in the hospital. On exam, vitals stable, relaxing comfortably. Smells of stale urine. On abdominal exam, no focal tenderness, relaxing comfortably. Will send labs. Labs and CXR normal. I spoke with daughter-in-law who reports this has been a gradual change over month past. She has been appropriate here. Patient's symptoms c/w worsening dementia. Will have her f/u with PCP, does not need emergent Psych eval at this time.  Dagmar Hait, MD 07/25/13 0005

## 2013-07-24 NOTE — Discharge Instructions (Signed)
Dementia Dementia is a general term for problems with brain function. A person with dementia has memory loss and a hard time with at least one other brain function such as thinking, speaking, or problem solving. Dementia can affect social functioning, how you do your job, your mood, or your personality. The changes may be hidden for a long time. The earliest forms of this disease are usually not detected by family or friends. Dementia can be:  Irreversible.  Potentially reversible.  Partially reversible.  Progressive. This means it can get worse over time. CAUSES  Irreversible dementia causes may include:  Degeneration of brain cells (Alzheimer's disease or lewy body dementia).  Multiple small strokes (vascular dementia).  Infection (chronic meningitis or Creutzfelt-Jakob disease).  Frontotemporal dementia. This affects younger people, age 40 to 70, compared to those who have Alzheimer's disease.  Dementia associated with other disorders like Parkinson's disease, Huntington's disease, or HIV-associated dementia. Potentially or partially reversible dementia causes may include:  Medicines.  Metabolic causes such as excessive alcohol intake, vitamin B12 deficiency, or thyroid disease.  Masses or pressure in the brain such as a tumor, blood clot, or hydrocephalus. SYMPTOMS  Symptoms are often hard to detect. Family members or coworkers may not notice them early in the disease process. Different people with dementia may have different symptoms. Symptoms can include:  A hard time with memory, especially recent memory. Long-term memory may not be impaired.  Asking the same question multiple times or forgetting something someone just said.  A hard time speaking your thoughts or finding certain words.  A hard time solving problems or performing familiar tasks (such as how to use a telephone).  Sudden changes in mood.  Changes in personality, especially increasing moodiness or  mistrust.  Depression.  A hard time understanding complex ideas that were never a problem in the past. DIAGNOSIS  There are no specific tests for dementia.   Your caregiver may recommend a thorough evaluation. This is because some forms of dementia can be reversible. The evaluation will likely include a physical exam and getting a detailed history from you and a family member. The history often gives the best clues and suggestions for a diagnosis.  Memory testing may be done. A detailed brain function evaluation called neuropsychologic testing may be helpful.  Lab tests and brain imaging (such as a CT scan or MRI scan) are sometimes important.  Sometimes observation and re-evaluation over time is very helpful. TREATMENT  Treatment depends on the cause.   If the problem is a vitamin deficiency, it may be helped or cured with supplements.  For dementias such as Alzheimer's disease, medicines are available to stabilize or slow the course of the disease. There are no cures for this type of dementia.  Your caregiver can help direct you to groups, organizations, and other caregivers to help with decisions in the care of you or your loved one. HOME CARE INSTRUCTIONS The care of individuals with dementia is varied and dependent upon the progression of the dementia. The following suggestions are intended for the person living with, or caring for, the person with dementia.  Create a safe environment.  Remove the locks on bathroom doors to prevent the person from accidentally locking himself or herself in.  Use childproof latches on kitchen cabinets and any place where cleaning supplies, chemicals, or alcohol are kept.  Use childproof covers in unused electrical outlets.  Install childproof devices to keep doors and windows secured.  Remove stove knobs or install safety   knobs and an automatic shut-off on the stove.  Lower the temperature on water heaters.  Label medicines and keep them  locked up.  Secure knives, lighters, matches, power tools, and guns, and keep these items out of reach.  Keep the house free from clutter. Remove rugs or anything that might contribute to a fall.  Remove objects that might break and hurt the person.  Make sure lighting is good, both inside and outside.  Install grab rails as needed.  Use a monitoring device to alert you to falls or other needs for help.  Reduce confusion.  Keep familiar objects and people around.  Use night lights or dim lights at night.  Label items or areas.  Use reminders, notes, or directions for daily activities or tasks.  Keep a simple, consistent routine for waking, meals, bathing, dressing, and bedtime.  Create a calm, quiet environment.  Place large clocks and calendars prominently.  Display emergency numbers and home address near all telephones.  Use cues to establish different times of the day. An example is to open curtains to let the natural light in during the day.   Use effective communication.  Choose simple words and short sentences.  Use a gentle, calm tone of voice.  Be careful not to interrupt.  If the person is struggling to find a word or communicate a thought, try to provide the word or thought.  Ask one question at a time. Allow the person ample time to answer questions. Repeat the question again if the person does not respond.  Reduce nighttime restlessness.  Provide a comfortable bed.  Have a consistent nighttime routine.  Ensure a regular walking or physical activity schedule. Involve the person in daily activities as much as possible.  Limit napping during the day.  Limit caffeine.  Attend social events that stimulate rather than overwhelm the senses.  Encourage good nutrition and hydration.  Reduce distractions during meal times and snacks.  Avoid foods that are too hot or too cold.  Monitor chewing and swallowing ability.  Continue with routine vision,  hearing, dental, and medical screenings.  Only give over-the-counter or prescription medicines as directed by the caregiver.  Monitor driving abilities. Do not allow the person to drive when safe driving is no longer possible.  Register with an identification program which could provide location assistance in the event of a missing person situation. SEEK MEDICAL CARE IF:   New behavioral problems start such as moodiness, aggressiveness, or seeing things that are not there (hallucinations).  Any new problem with brain function happens. This includes problems with balance, speech, or falling a lot.  Problems with swallowing develop.  Any symptoms of other illness happen. Small changes or worsening in any aspect of brain function can be a sign that the illness is getting worse. It can also be a sign of another medical illness such as infection. Seeing a caregiver right away is important. SEEK IMMEDIATE MEDICAL CARE IF:   A fever develops.  New or worsened confusion develops.  New or worsened sleepiness develops.  Staying awake becomes hard to do. Document Released: 10/28/2000 Document Revised: 07/27/2011 Document Reviewed: 09/29/2010 [employer] Patient Information 2014 [employer].  

## 2013-08-11 ENCOUNTER — Non-Acute Institutional Stay (SKILLED_NURSING_FACILITY): Payer: Medicare Other | Admitting: Nurse Practitioner

## 2013-08-11 DIAGNOSIS — D649 Anemia, unspecified: Secondary | ICD-10-CM

## 2013-08-11 DIAGNOSIS — F0390 Unspecified dementia without behavioral disturbance: Secondary | ICD-10-CM

## 2013-08-11 DIAGNOSIS — I10 Essential (primary) hypertension: Secondary | ICD-10-CM

## 2013-08-11 DIAGNOSIS — F329 Major depressive disorder, single episode, unspecified: Secondary | ICD-10-CM

## 2013-08-11 DIAGNOSIS — I639 Cerebral infarction, unspecified: Secondary | ICD-10-CM

## 2013-08-11 DIAGNOSIS — F319 Bipolar disorder, unspecified: Secondary | ICD-10-CM

## 2013-08-11 DIAGNOSIS — I6350 Cerebral infarction due to unspecified occlusion or stenosis of unspecified cerebral artery: Secondary | ICD-10-CM

## 2013-08-11 DIAGNOSIS — E871 Hypo-osmolality and hyponatremia: Secondary | ICD-10-CM

## 2013-08-11 DIAGNOSIS — F3289 Other specified depressive episodes: Secondary | ICD-10-CM

## 2013-08-11 DIAGNOSIS — F32A Depression, unspecified: Secondary | ICD-10-CM

## 2013-08-11 DIAGNOSIS — E039 Hypothyroidism, unspecified: Secondary | ICD-10-CM

## 2013-08-11 NOTE — Progress Notes (Signed)
Patient ID: [name], female   DOB: 1940/11/07, 73 y.o.   MRN: [id number]    Nursing Home Location:  [facility]   Place of Service: SNF (31)  PCP: REED, TIFFANY, DO  Allergies  Allergen Reactions  . Demerol     Per MAR.    Marland Kitchen Ampicillin     Per facility [facility].   Marland Kitchen Aspirin     Per facility [facility].    Marland Kitchen Codeine     Per MAR.    . Dilaudid [Hydromorphone Hcl]     Per MAR.   Marland Kitchen Elavil [Amitriptyline]     Per MAR.   Marland Kitchen Flexeril [Cyclobenzaprine Hcl]     Per MAR.    . Nsaids     Per facility [facility].    . Other     COX2 inhibitors. Per facility [facility].    Marland Kitchen Penicillins     Per MAR.      Chief Complaint  Patient presents with  . Medical Managment of Chronic Issues    HPI:  73 yo morbidly obese white female with h/o depression, hypothyroidism, dementia, chronic pain, constipation and vitamin D deficiency was seen today for routine follow up of chronic diseases. Pt with recent decline due to worsening of dementia; decrease PO intake ,weight loss being monitored by dietary and being seen by ST; pt still obese. Behaviors occasionally but overall this has been stable. Pt unwilling to cooperate during exam today and does not offer any information regarding health; staff reports no concerns but notes that she does get agitated with staff at times and will not speak  Review of Systems:  Review of Systems  Unable to perform ROS: dementia     Past Medical History  Diagnosis Date  . Hypertension   . Allergy   . Bipolar 1 disorder   . Dementia   . GERD (gastroesophageal reflux disease)   . Hyponatremia   . Schizoaffective disorder   . Cerebrovascular accident   . Anemia   . Thyroid disease    Past Surgical History  Procedure Laterality Date  . Appendectomy    . Fracture surgery     Social History:   reports that she has been smoking.  She does not have any smokeless tobacco history on file. She reports that she does not drink alcohol or use illicit drugs.  No  family history on file.  Medications: Patient's Medications  New Prescriptions   No medications on file  Previous Medications   ASPIRIN 81 MG EC TABLET    Take 81 mg by mouth daily.     CALCIUM-VITAMIN D (OSCAL WITH D) 500-200 MG-UNIT PER TABLET    Take 1 tablet by mouth 2 (two) times daily.     CHOLECALCIFEROL (VITAMIN D) 1000 UNITS TABLET    Take 2,000 Units by mouth daily.   CLOPIDOGREL (PLAVIX) 75 MG TABLET    Take 75 mg by mouth daily.     DIVALPROEX (DEPAKOTE ER) 500 MG 24 HR TABLET    Take 1,000 mg by mouth daily.   DONEPEZIL (ARICEPT) 10 MG TABLET    Take 10 mg by mouth at bedtime.    DULOXETINE (CYMBALTA) 60 MG CAPSULE    Take 60 mg by mouth daily.   FUROSEMIDE (LASIX) 20 MG TABLET    Take 20 mg by mouth daily.    HYDROCODONE-ACETAMINOPHEN (NORCO) 5-325 MG PER TABLET    Take 1 tablet by mouth every 4 (four) hours as needed (moderate  pain, 2 tabs q 4 hours as needed for severe pain).    LEVOTHYROXINE (SYNTHROID, LEVOTHROID) 75 MCG TABLET    Take 125 mcg by mouth daily.    LISINOPRIL (PRINIVIL,ZESTRIL) 2.5 MG TABLET    Take 2.5 mg by mouth daily.     LORAZEPAM (ATIVAN) 0.5 MG TABLET    Take 1/2 tablet by mouth once daily for anxiety; Take 1/2 tablet by mouth twice daily as needed for breakthrough anxiety.   MEMANTINE HCL ER (NAMENDA XR) 28 MG CP24    Take 28 mg by mouth daily.   MENTHOL-ZINC OXIDE (REMEDY CALAZIME) 0.2-20 % PSTE    Apply topically. Apply to buttocks each shift for preventive care after cleansing with normal saline   MULTIPLE VITAMIN (MULTIVITAMIN) TABLET    Take 1 tablet by mouth daily.   PHENOL (CHLORASEPTIC) 1.4 % LIQD    Use as directed 1 spray in the mouth or throat every 6 (six) hours as needed for throat irritation / pain.    POLYETHYL GLYCOL-PROPYL GLYCOL (SYSTANE) 0.4-0.3 % SOLN    Place 1 drop into both eyes 2 (two) times daily.   POLYETHYLENE GLYCOL POWDER (GLYCOLAX/MIRALAX) POWDER    Take 17 g by mouth daily.   POTASSIUM CHLORIDE (K-DUR) 10 MEQ TABLET     Take 10 mEq by mouth daily.    PROPRANOLOL (INDERAL) 10 MG TABLET    Take 10 mg by mouth 2 (two) times daily.     RANITIDINE (ZANTAC) 75 MG TABLET    Take 150 mg by mouth at bedtime.    RISPERIDONE (RISPERDAL) 0.25 MG TABLET    Take 0.25 mg by mouth at bedtime.    SENNA (SENOKOT) 8.6 MG TABLET    Take 1 tablet by mouth 2 (two) times daily.    SIMVASTATIN (ZOCOR) 20 MG TABLET    Take 40 mg by mouth at bedtime.    SODIUM CHLORIDE 1 G TABLET    Take 1 g by mouth 3 (three) times daily.  Modified Medications   No medications on file  Discontinued Medications   No medications on file     Physical Exam:  Filed Vitals:   08/11/13 1437  BP: 136/68  Pulse: 60  Temp: 97.8 F (36.6 C)  Resp: 18    Physical Exam  Constitutional: She is well-developed, well-nourished, and in no distress. No distress.  Neck: Normal range of motion. Neck supple. No thyromegaly present.  Cardiovascular: Normal rate, regular rhythm and normal heart sounds.   Pulmonary/Chest: Effort normal.  Diminished Breath sounds  Abdominal: Soft. Bowel sounds are normal. She exhibits no distension. There is no tenderness.  obese  Musculoskeletal: She exhibits no edema and no tenderness.  Neurological: She is alert.  Skin: Skin is warm and dry. She is not diaphoretic.      Labs reviewed: 12/19/12: wbc 5.6, rbc 4.41, hgb 12.4, hct 36.7, plt 222   Sodium 128, potassium 4.2, glucose 84, BUN 12, Cr 0.75   hgb A1c 5.5   Vit D 63   01/18/13: TSH 0.096 BMP with Estimated GFR          Result: 03/04/2013 4:00 PM             ( Status: F )                    Sodium            127  L          135-145           mEq/L SLN       Potassium       4.2                               3.5-5.3 mEq/L SLN        Chloride           93                    L          96-112 mEq/L SLN        CO2    28                                19-32   mEq/L SLN        Glucose           96                                70-99   mg/dL  SLN         BUN     6                                  6-23     mg/dL  SLN        Creatinine        0.71                             0.50-1.10         mg/dL  SLN       Calcium           9.5                               8.4-10.5           mg/dL  SLN       Est GFR, African American    >89                                          mL/min            SLN       Est GFR, NonAfrican American          86                                            mL/min            SLN            C Lipid Profile        Result: 03/04/2013 4:00 PM             ( Status: F )  Cholesterol      126                              0-200   mg/dL  SLN     C Triglyceride     79                                <150    mg/dL  SLN        HDL Cholesterol         42                                >39      mg/dL  SLN       Total Chol/HDL Ratio  3.0                                           Ratio    SLN       VLDL Cholesterol (Calc)         16                                0-40     mg/dL  SLN       LDL Cholesterol (Calc)           68                                0-99     mg/dL  SLN     C TSH, Ultrasensitive        Result: 03/04/2013 4:10 PM             ( Status: F )                    TSH     0.826                           0.350-4.500     uIU/mL SLN     BMP with Estimated GFR          Result: 03/08/2013 11:40 AM            ( Status: F )                 C Sodium            129                  L          135-145           mEq/L SLN       Potassium       4.3                               3.5-5.3 mEq/L SLN        Chloride           91  L          96-112 mEq/L SLN        CO2    30                                19-32   mEq/L SLN        Glucose           83                                70-99   mg/dL  SLN        BUN     7                                  6-23     mg/dL  SLN        Creatinine        0.78                             0.50-1.10         mg/dL  SLN       Calcium           8.8                                8.4-10.5           mg/dL  SLN       Est GFR, African American    88                                            mL/min            SLN       Est GFR, NonAfrican American          77  Basic Metabolic Panel:  Recent Labs  16/10/96 1651  NA 128*  K 4.6  CL 89*  CO2 27  GLUCOSE 119*  BUN 7  CREATININE 0.78  CALCIUM 9.5   Liver Function Tests:  Recent Labs  07/24/13 1651  AST 18  ALT 9  ALKPHOS 86  BILITOT 0.3  PROT 7.0  ALBUMIN 3.2*   No results found for this basename: LIPASE, AMYLASE,  in the last 8760 hours No results found for this basename: AMMONIA,  in the last 8760 hours CBC:  Recent Labs  07/24/13 1651  WBC 7.8  HGB 12.8  HCT 36.1  MCV 85.5  PLT 220     Assessment/Plan 1. Hypertension Stable on current medications, conts lasix, propanolol   2. Cerebrovascular accident -conts plavix  3. Unspecified hypothyroidism -TSH done in oct which was WNR, conts synthroid 125 mcg daily   4. Dementia without behavioral disturbance -has had ongoing cognitive decline, conts on namenda and aricept   5. Bipolar 1 disorder -conts to be followed by psych, conts on cymbalta, risperdal, depakote, and ativan  6. Anemia -cbc stable per ED recorders  7. Depression -conts on cymbalta  8. Hyponatremia Sodium of 128 in hospital eariler this month; this is stable

## 2013-10-18 ENCOUNTER — Encounter: Payer: Self-pay | Admitting: Internal Medicine

## 2013-11-21 ENCOUNTER — Non-Acute Institutional Stay (SKILLED_NURSING_FACILITY): Payer: Medicare Other | Admitting: Internal Medicine

## 2013-11-21 DIAGNOSIS — I639 Cerebral infarction, unspecified: Secondary | ICD-10-CM

## 2013-11-21 DIAGNOSIS — F319 Bipolar disorder, unspecified: Secondary | ICD-10-CM

## 2013-11-21 DIAGNOSIS — F0390 Unspecified dementia without behavioral disturbance: Secondary | ICD-10-CM

## 2013-11-21 DIAGNOSIS — I10 Essential (primary) hypertension: Secondary | ICD-10-CM

## 2013-11-21 DIAGNOSIS — E039 Hypothyroidism, unspecified: Secondary | ICD-10-CM

## 2013-11-21 DIAGNOSIS — I6350 Cerebral infarction due to unspecified occlusion or stenosis of unspecified cerebral artery: Secondary | ICD-10-CM

## 2013-11-21 DIAGNOSIS — E785 Hyperlipidemia, unspecified: Secondary | ICD-10-CM

## 2013-11-21 NOTE — Progress Notes (Signed)
MRN: [id number] Name: [name]  Sex: female Age: 73 y.o. DOB: 10/24/1940  [facility] Facility[facility] 211B Level Of Care: SNF Provider: Merrilee SeashoreALEXANDER, Kashlynn Kundert D Emergency Contacts: Extended Emergency Contact Information Primary Emergency Contact: Sandria SenterBrewer,James & Lisa Address: [address]           [place], [place] [address] [place] Home Phone: [phone] Mobile Phone: [phone] Relation: Son Secondary Emergency Contact: Marilu FavreBARNES,RAYMOND Address: [address]          [place], [place] [address] Home Phone: [phone] Relation: None  Code Status: FULL  Allergies: Demerol; Ampicillin; Aspirin; Codeine; Dilaudid; Elavil; Flexeril; Nsaids; Other; and Penicillins  Chief Complaint  Patient presents with  . Medical Management of Chronic Issues    HPI: Patient is 73 y.o. female who is being seen for routine problems.  Past Medical History  Diagnosis Date  . Hypertension   . Allergy   . Bipolar 1 disorder   . Dementia   . GERD (gastroesophageal reflux disease)   . Hyponatremia   . Schizoaffective disorder   . Cerebrovascular accident   . Anemia   . Thyroid disease     Past Surgical History  Procedure Laterality Date  . Appendectomy    . Fracture surgery        Medication List       This list is accurate as of: 11/21/13 11:59 PM.  Always use your most recent med list.               aspirin 81 MG EC tablet  Take 81 mg by mouth daily.     calcium-vitamin D 500-200 MG-UNIT per tablet  Commonly known as:  OSCAL WITH D  Take 1 tablet by mouth 2 (two) times daily.     CHLORASEPTIC 1.4 % Liqd  Generic drug:  phenol  Use as directed 1 spray in the mouth or throat every 6 (six) hours as needed for throat irritation / pain.     cholecalciferol 1000 UNITS tablet  Commonly known as:  VITAMIN D  Take 2,000 Units by mouth daily.     clopidogrel 75 MG tablet  Commonly known as:  PLAVIX  Take 75 mg by mouth daily.     divalproex 500 MG 24 hr tablet   Commonly known as:  DEPAKOTE ER  Take 1,000 mg by mouth daily.     donepezil 10 MG tablet  Commonly known as:  ARICEPT  Take 10 mg by mouth at bedtime.     DULoxetine 60 MG capsule  Commonly known as:  CYMBALTA  Take 60 mg by mouth daily.     furosemide 20 MG tablet  Commonly known as:  LASIX  Take 20 mg by mouth daily.     HYDROcodone-acetaminophen 5-325 MG per tablet  Commonly known as:  NORCO/VICODIN  Take 1 tablet by mouth every 4 (four) hours as needed (moderate pain, 2 tabs q 4 hours as needed for severe pain).     levothyroxine 75 MCG tablet  Commonly known as:  SYNTHROID, LEVOTHROID  Take 125 mcg by mouth daily.     lisinopril 2.5 MG tablet  Commonly known as:  PRINIVIL,ZESTRIL  Take 2.5 mg by mouth daily.     LORazepam 0.5 MG tablet  Commonly known as:  ATIVAN  Take 1/2 tablet by mouth once daily for anxiety; Take 1/2 tablet by mouth twice daily as needed for breakthrough anxiety.     multivitamin tablet  Take 1 tablet by mouth daily.  NAMENDA XR 28 MG Cp24  Generic drug:  Memantine HCl ER  Take 28 mg by mouth daily.     polyethylene glycol powder powder  Commonly known as:  GLYCOLAX/MIRALAX  Take 17 g by mouth daily.     potassium chloride 10 MEQ tablet  Commonly known as:  K-DUR  Take 10 mEq by mouth daily.     propranolol 10 MG tablet  Commonly known as:  INDERAL  Take 10 mg by mouth 2 (two) times daily.     ranitidine 75 MG tablet  Commonly known as:  ZANTAC  Take 150 mg by mouth at bedtime.     REMEDY CALAZIME 0.2-20 % Pste  Generic drug:  Menthol-Zinc Oxide  Apply topically. Apply to buttocks each shift for preventive care after cleansing with normal saline     risperiDONE 0.25 MG tablet  Commonly known as:  RISPERDAL  Take 0.25 mg by mouth at bedtime.     senna 8.6 MG tablet  Commonly known as:  SENOKOT  Take 1 tablet by mouth 2 (two) times daily.     simvastatin 20 MG tablet  Commonly known as:  ZOCOR  Take 40 mg by mouth at  bedtime.     sodium chloride 1 G tablet  Take 1 g by mouth 3 (three) times daily.     SYSTANE 0.4-0.3 % Soln  Generic drug:  Polyethyl Glycol-Propyl Glycol  Place 1 drop into both eyes 2 (two) times daily.        No orders of the defined types were placed in this encounter.    Immunization History  Administered Date(s) Administered  . Influenza Whole 02/27/2013  . Pneumococcal-Unspecified 10/06/2011    History  Substance Use Topics  . Smoking status: Current Every Day Smoker -- 1.00 packs/day  . Smokeless tobacco: Not on file  . Alcohol Use: No    Review of Systems  UTO meaningful 2/2 dementia l.   Filed Vitals:   11/21/13 2309  BP: 121/77  Pulse: 81  Temp: 98.2 F (36.8 C)  Resp: 18    Physical Exam  GENERAL APPEARANCE: Alert, min conversant. Appropriately groomed. No acute distress  SKIN: No diaphoresis rash HEENT: Unremarkable RESPIRATORY: Breathing is even, unlabored. Lung sounds are clear   CARDIOVASCULAR: Heart RRR no murmurs, rubs or gallops. No peripheral edema  GASTROINTESTINAL: Abdomen is soft, non-tender, not distended w/ normal bowel sounds.  GENITOURINARY: Bladder non tender, not distended  MUSCULOSKELETAL: contracture LUE NEUROLOGIC: Cranial nerves 2-12 grossly intact;L side hemiparesis. PSYCHIATRIC: Mood and affect appropriate to situation, no behavioral issues  Patient Active Problem List   Diagnosis Date Noted  . Hyponatremia 05/25/2013  . Depression 02/17/2013  . Hypertension   . Dementia without behavioral disturbance   . Anemia   . Bipolar 1 disorder   . Cerebrovascular accident   . Unspecified hypothyroidism 09/23/2012  . Other and unspecified hyperlipidemia 09/23/2012  . History of stroke with residual effects 11/26/2010  . Bipolar disease, chronic 11/26/2010  . Appendicitis 11/26/2010    CBC    Component Value Date/Time   WBC 7.8 07/24/2013 1651   RBC 4.22 07/24/2013 1651   HGB 12.8 07/24/2013 1651   HCT 36.1 07/24/2013 1651    PLT 220 07/24/2013 1651   MCV 85.5 07/24/2013 1651   LYMPHSABS 1.6 09/26/2010 0522   MONOABS 1.4* 09/26/2010 0522   EOSABS 0.1 09/26/2010 0522   BASOSABS 0.0 09/26/2010 0522    CMP     Component Value Date/Time  NA 128* 07/24/2013 1651   K 4.6 07/24/2013 1651   CL 89* 07/24/2013 1651   CO2 27 07/24/2013 1651   GLUCOSE 119* 07/24/2013 1651   BUN 7 07/24/2013 1651   CREATININE 0.78 07/24/2013 1651   CALCIUM 9.5 07/24/2013 1651   PROT 7.0 07/24/2013 1651   ALBUMIN 3.2* 07/24/2013 1651   AST 18 07/24/2013 1651   ALT 9 07/24/2013 1651   ALKPHOS 86 07/24/2013 1651   BILITOT 0.3 07/24/2013 1651   GFRNONAA 82* 07/24/2013 1651   GFRAA >90 07/24/2013 1651    Assessment and Plan  Cerebrovascular accident Residual L side weakness; pt on daily ASA  Hypertension Good control on low dose propranolol, lasix and lisinopril  Unspecified hypothyroidism Continue synthroid  Dementia without behavioral disturbance Continue aricept and namenda; depakote was increased by psych  Bipolar 1 disorder Depakote, risperdal and cymbalta are written by psych. depakote was incresed recently  Other and unspecified hyperlipidemia Continue zocor 20 mg    Margit HanksALEXANDER, Jonerik Sliker D, MD

## 2013-11-26 ENCOUNTER — Encounter: Payer: Self-pay | Admitting: Internal Medicine

## 2013-11-26 NOTE — Assessment & Plan Note (Signed)
Residual L side weakness; pt on daily ASA

## 2013-11-26 NOTE — Assessment & Plan Note (Signed)
Continue zocor 20 mg 

## 2013-11-26 NOTE — Assessment & Plan Note (Signed)
Continue aricept and namenda; depakote was increased by psych

## 2013-11-26 NOTE — Assessment & Plan Note (Signed)
Depakote, risperdal and cymbalta are written by psych. depakote was incresed recently

## 2013-11-26 NOTE — Assessment & Plan Note (Signed)
Good control on low dose propranolol, lasix and lisinopril

## 2013-11-26 NOTE — Assessment & Plan Note (Signed)
Continue synthroid.

## 2013-12-19 ENCOUNTER — Encounter: Payer: Self-pay | Admitting: Internal Medicine

## 2013-12-19 ENCOUNTER — Non-Acute Institutional Stay (SKILLED_NURSING_FACILITY): Payer: Medicare Other | Admitting: Internal Medicine

## 2013-12-19 DIAGNOSIS — S93409A Sprain of unspecified ligament of unspecified ankle, initial encounter: Secondary | ICD-10-CM

## 2013-12-19 DIAGNOSIS — S93401D Sprain of unspecified ligament of right ankle, subsequent encounter: Secondary | ICD-10-CM

## 2013-12-19 DIAGNOSIS — Z5189 Encounter for other specified aftercare: Secondary | ICD-10-CM

## 2013-12-19 DIAGNOSIS — F319 Bipolar disorder, unspecified: Secondary | ICD-10-CM

## 2013-12-19 NOTE — Progress Notes (Signed)
MRN: [id number] Name: [name]  Sex: female Age: 73 y.o. DOB: 11/28/1940  [facility] Facility/Room: 211 Level Of Care: SNF Provider: Merrilee Seashore D Emergency Contacts: Extended Emergency Contact Information Primary Emergency Contact: Sandria Senter Address: [address]           [place], [place] [address] [place] Home Phone: [phone] Mobile Phone: [phone] Relation: Son Secondary Emergency Contact: Uno,RAYMOND Address: [address]          [place], [place] [address] Home Phone: [phone] Relation: None  Code Status:   Allergies: Demerol; Ampicillin; Aspirin; Codeine; Dilaudid; Elavil; Flexeril; Nsaids; Other; and Penicillins  Chief Complaint  Patient presents with  . Acute Visit    HPI: Patient is 73 y.o. female who nursing asked me to see for ankle pain.  Past Medical History  Diagnosis Date  . Hypertension   . Allergy   . Bipolar 1 disorder   . Dementia   . GERD (gastroesophageal reflux disease)   . Hyponatremia   . Schizoaffective disorder   . Cerebrovascular accident   . Anemia   . Thyroid disease     Past Surgical History  Procedure Laterality Date  . Appendectomy    . Fracture surgery        Medication List       This list is accurate as of: 12/19/13  9:53 PM.  Always use your most recent med list.               aspirin 81 MG EC tablet  Take 81 mg by mouth daily.     calcium-vitamin D 500-200 MG-UNIT per tablet  Commonly known as:  OSCAL WITH D  Take 1 tablet by mouth 2 (two) times daily.     CHLORASEPTIC 1.4 % Liqd  Generic drug:  phenol  Use as directed 1 spray in the mouth or throat every 6 (six) hours as needed for throat irritation / pain.     cholecalciferol 1000 UNITS tablet  Commonly known as:  VITAMIN D  Take 2,000 Units by mouth daily.     clopidogrel 75 MG tablet  Commonly known as:  PLAVIX  Take 75 mg by mouth daily.     divalproex 500 MG 24 hr tablet  Commonly known as:  DEPAKOTE ER   Take 1,000 mg by mouth daily.     donepezil 10 MG tablet  Commonly known as:  ARICEPT  Take 10 mg by mouth at bedtime.     DULoxetine 60 MG capsule  Commonly known as:  CYMBALTA  Take 60 mg by mouth daily.     furosemide 20 MG tablet  Commonly known as:  LASIX  Take 20 mg by mouth daily.     HYDROcodone-acetaminophen 5-325 MG per tablet  Commonly known as:  NORCO/VICODIN  Take 1 tablet by mouth every 4 (four) hours as needed (moderate pain, 2 tabs q 4 hours as needed for severe pain).     levothyroxine 75 MCG tablet  Commonly known as:  SYNTHROID, LEVOTHROID  Take 125 mcg by mouth daily.     lisinopril 2.5 MG tablet  Commonly known as:  PRINIVIL,ZESTRIL  Take 2.5 mg by mouth daily.     LORazepam 0.5 MG tablet  Commonly known as:  ATIVAN  Take 1/2 tablet by mouth once daily for anxiety; Take 1/2 tablet by mouth twice daily as needed for breakthrough anxiety.     multivitamin tablet  Take 1 tablet by mouth daily.  NAMENDA XR 28 MG Cp24  Generic drug:  Memantine HCl ER  Take 28 mg by mouth daily.     polyethylene glycol powder powder  Commonly known as:  GLYCOLAX/MIRALAX  Take 17 g by mouth daily.     potassium chloride 10 MEQ tablet  Commonly known as:  K-DUR  Take 10 mEq by mouth daily.     propranolol 10 MG tablet  Commonly known as:  INDERAL  Take 10 mg by mouth 2 (two) times daily.     ranitidine 75 MG tablet  Commonly known as:  ZANTAC  Take 150 mg by mouth at bedtime.     REMEDY CALAZIME 0.2-20 % Pste  Generic drug:  Menthol-Zinc Oxide  Apply topically. Apply to buttocks each shift for preventive care after cleansing with normal saline     risperiDONE 0.25 MG tablet  Commonly known as:  RISPERDAL  Take 0.25 mg by mouth at bedtime.     senna 8.6 MG tablet  Commonly known as:  SENOKOT  Take 1 tablet by mouth 2 (two) times daily.     simvastatin 20 MG tablet  Commonly known as:  ZOCOR  Take 40 mg by mouth at bedtime.     sodium chloride 1 G  tablet  Take 1 g by mouth 3 (three) times daily.     SYSTANE 0.4-0.3 % Soln  Generic drug:  Polyethyl Glycol-Propyl Glycol  Place 1 drop into both eyes 2 (two) times daily.        No orders of the defined types were placed in this encounter.    Immunization History  Administered Date(s) Administered  . Influenza Whole 02/27/2013  . Pneumococcal-Unspecified 10/06/2011    History  Substance Use Topics  . Smoking status: Current Every Day Smoker -- 1.00 packs/day  . Smokeless tobacco: Not on file  . Alcohol Use: No    Review of Systems  DATA OBTAINED: nurse - nurse c/o heat to area since pt fell;ankle xray was neg; UTO from pt GENERAL: Feels well no fevers, fatigue, appetite changes SKIN: No itching, rash HEENT: No complaint RESPIRATORY: No cough, wheezing, SOB CARDIAC: No chest pain, palpitations, lower extremity edema  GI: No abdominal pain, No N/V/D or constipation, No heartburn or reflux  GU: No dysuria, frequency or urgency, or incontinence  MUSCULOSKELETAL: feather light touch to ankle causes pt to scream NEUROLOGIC: No headache, dizziness PSYCHIATRIC: No overt anxiety    Filed Vitals:   12/19/13 2152  BP: 122/87  Pulse: 86  Temp: 98.2 F (36.8 C)  Resp: 17    Physical Exam  GENERAL APPEARANCE: Alert, minconversant. Appropriately groomed. No acute distress  SKIN: No diaphoresis rash HEENT: Unremarkable RESPIRATORY: Breathing is even, unlabored. Lung sounds are clear   CARDIOVASCULAR: Heart RRR no murmurs, rubs or gallops. No peripheral edema  GASTROINTESTINAL: Abdomen is soft, non-tender, not distended w/ normal bowel sounds.  GENITOURINARY: Bladder non tender, not distended  MUSCULOSKELETAL: mild swelling, minimal heat, no redness, some bruising lateral malleolus NEUROLOGIC: Cranial nerves 2-12 grossly intact PSYCHIATRIC: odd affect;, no behavioral issues  Patient Active Problem List   Diagnosis Date Noted  . Hyponatremia 05/25/2013  . Depression  02/17/2013  . Hypertension   . Dementia without behavioral disturbance   . Anemia   . Bipolar 1 disorder   . Cerebrovascular accident   . Unspecified hypothyroidism 09/23/2012  . Other and unspecified hyperlipidemia 09/23/2012  . History of stroke with residual effects 11/26/2010  . Bipolar disease, chronic 11/26/2010  . Appendicitis  11/26/2010    CBC    Component Value Date/Time   WBC 7.8 07/24/2013 1651   RBC 4.22 07/24/2013 1651   HGB 12.8 07/24/2013 1651   HCT 36.1 07/24/2013 1651   PLT 220 07/24/2013 1651   MCV 85.5 07/24/2013 1651   LYMPHSABS 1.6 09/26/2010 0522   MONOABS 1.4* 09/26/2010 0522   EOSABS 0.1 09/26/2010 0522   BASOSABS 0.0 09/26/2010 0522    CMP     Component Value Date/Time   NA 128* 07/24/2013 1651   K 4.6 07/24/2013 1651   CL 89* 07/24/2013 1651   CO2 27 07/24/2013 1651   GLUCOSE 119* 07/24/2013 1651   BUN 7 07/24/2013 1651   CREATININE 0.78 07/24/2013 1651   CALCIUM 9.5 07/24/2013 1651   PROT 7.0 07/24/2013 1651   ALBUMIN 3.2* 07/24/2013 1651   AST 18 07/24/2013 1651   ALT 9 07/24/2013 1651   ALKPHOS 86 07/24/2013 1651   BILITOT 0.3 07/24/2013 1651   GFRNONAA 82* 07/24/2013 1651   GFRAA >90 07/24/2013 1651    Assessment and Plan  ANKLE SPRAIN- ACE wrap if pt tolerates; treatment is complicated by psych dx  BIPOLAR- affect complicates treatment and perception;fortunately only a sprain  Margit HanksALEXANDER, Carsten Carstarphen D, MD

## 2014-01-08 ENCOUNTER — Other Ambulatory Visit: Payer: Self-pay | Admitting: *Deleted

## 2014-01-08 MED ORDER — LORAZEPAM 2 MG/ML IJ SOLN: INTRAMUSCULAR | Status: DC

## 2014-01-08 NOTE — Telephone Encounter (Signed)
[name] 

## 2014-01-29 ENCOUNTER — Other Ambulatory Visit: Payer: Self-pay | Admitting: *Deleted

## 2014-01-29 MED ORDER — TRAMADOL HCL 50 MG PO TABS: ORAL_TABLET | ORAL | Status: DC

## 2014-01-29 NOTE — Telephone Encounter (Signed)
[name] 

## 2014-01-30 ENCOUNTER — Non-Acute Institutional Stay (SKILLED_NURSING_FACILITY): Payer: Medicare Other | Admitting: Internal Medicine

## 2014-01-30 DIAGNOSIS — I70209 Unspecified atherosclerosis of native arteries of extremities, unspecified extremity: Secondary | ICD-10-CM

## 2014-01-30 DIAGNOSIS — F0390 Unspecified dementia without behavioral disturbance: Secondary | ICD-10-CM

## 2014-01-30 DIAGNOSIS — L98499 Non-pressure chronic ulcer of skin of other sites with unspecified severity: Secondary | ICD-10-CM

## 2014-01-30 DIAGNOSIS — I739 Peripheral vascular disease, unspecified: Secondary | ICD-10-CM

## 2014-01-30 DIAGNOSIS — F319 Bipolar disorder, unspecified: Secondary | ICD-10-CM

## 2014-01-30 HISTORY — DX: Unspecified atherosclerosis of native arteries of extremities, unspecified extremity: I70.209

## 2014-01-30 HISTORY — DX: Non-pressure chronic ulcer of skin of other sites with unspecified severity: L98.499

## 2014-01-30 NOTE — Progress Notes (Signed)
MRN: [id number] Name: [name]  Sex: female Age: 73 y.o. DOB: August 10, 1940  PSC #: Ronni Rumble Facility/Room: 213A Level Of Care: SNF Provider: Merrilee Seashore D Emergency Contacts: Extended Emergency Contact Information Primary Emergency Contact: Sandria Senter Address: [address]           [place], [place] [address] [place] Home Phone: [phone] Mobile Phone: [phone] Relation: Son Secondary Emergency Contact: Alcott,RAYMOND Address: [address]          [place], [place] [address] Home Phone: [phone] Relation: None  Code Status:   Allergies: Demerol; Ampicillin; Aspirin; Codeine; Dilaudid; Elavil; Flexeril; Nsaids; Other; and Penicillins  Chief Complaint  Patient presents with  . Medical Management of Chronic Issues    HPI: Patient is 73 y.o. female who has a new wound on R heel.   Past Medical History  Diagnosis Date  . Hypertension   . Allergy   . Bipolar 1 disorder   . Dementia   . GERD (gastroesophageal reflux disease)   . Hyponatremia   . Schizoaffective disorder   . Cerebrovascular accident   . Anemia   . Thyroid disease     Past Surgical History  Procedure Laterality Date  . Appendectomy    . Fracture surgery        Medication List       This list is accurate as of: 01/30/14 11:59 PM.  Always use your most recent med list.               aspirin 81 MG EC tablet  Take 81 mg by mouth daily.     calcium-vitamin D 500-200 MG-UNIT per tablet  Commonly known as:  OSCAL WITH D  Take 1 tablet by mouth 2 (two) times daily.     CHLORASEPTIC 1.4 % Liqd  Generic drug:  phenol  Use as directed 1 spray in the mouth or throat every 6 (six) hours as needed for throat irritation / pain.     cholecalciferol 1000 UNITS tablet  Commonly known as:  VITAMIN D  Take 2,000 Units by mouth daily.     clopidogrel 75 MG tablet  Commonly known as:  PLAVIX  Take 75 mg by mouth daily.     divalproex 500 MG 24 hr tablet  Commonly known as:   DEPAKOTE ER  Take 1,000 mg by mouth daily.     donepezil 10 MG tablet  Commonly known as:  ARICEPT  Take 10 mg by mouth at bedtime.     DULoxetine 60 MG capsule  Commonly known as:  CYMBALTA  Take 60 mg by mouth daily.     furosemide 20 MG tablet  Commonly known as:  LASIX  Take 20 mg by mouth daily.     HYDROcodone-acetaminophen 5-325 MG per tablet  Commonly known as:  NORCO/VICODIN  Take 1 tablet by mouth every 4 (four) hours as needed (moderate pain, 2 tabs q 4 hours as needed for severe pain).     levothyroxine 75 MCG tablet  Commonly known as:  SYNTHROID, LEVOTHROID  Take 125 mcg by mouth daily.     lisinopril 2.5 MG tablet  Commonly known as:  PRINIVIL,ZESTRIL  Take 2.5 mg by mouth daily.     LORazepam 0.5 MG tablet  Commonly known as:  ATIVAN  Take 1/2 tablet by mouth once daily for anxiety; Take 1/2 tablet by mouth twice daily as needed for breakthrough anxiety.     LORazepam 2 MG/ML injection  Commonly known as:  ATIVAN  Inject 0.46ml ( ) IM once and repeat as needed     multivitamin tablet  Take 1 tablet by mouth daily.     NAMENDA XR 28 MG Cp24  Generic drug:  Memantine HCl ER  Take 28 mg by mouth daily.     polyethylene glycol powder powder  Commonly known as:  GLYCOLAX/MIRALAX  Take 17 g by mouth daily.     potassium chloride 10 MEQ tablet  Commonly known as:  K-DUR  Take 10 mEq by mouth daily.     propranolol 10 MG tablet  Commonly known as:  INDERAL  Take 10 mg by mouth 2 (two) times daily.     ranitidine 75 MG tablet  Commonly known as:  ZANTAC  Take 150 mg by mouth at bedtime.     REMEDY CALAZIME 0.2-20 % Pste  Generic drug:  Menthol-Zinc Oxide  Apply topically. Apply to buttocks each shift for preventive care after cleansing with normal saline     risperiDONE 0.25 MG tablet  Commonly known as:  RISPERDAL  Take 0.25 mg by mouth at bedtime.     senna 8.6 MG tablet  Commonly known as:  SENOKOT  Take 1 tablet by mouth 2 (two) times  daily.     simvastatin 20 MG tablet  Commonly known as:  ZOCOR  Take 40 mg by mouth at bedtime.     sodium chloride 1 G tablet  Take 1 g by mouth 3 (three) times daily.     SYSTANE 0.4-0.3 % Soln  Generic drug:  Polyethyl Glycol-Propyl Glycol  Place 1 drop into both eyes 2 (two) times daily.     traMADol 50 MG tablet  Commonly known as:  ULTRAM  Take one tablet by mouth three times daily for pain        No orders of the defined types were placed in this encounter.    Immunization History  Administered Date(s) Administered  . Influenza Whole 02/27/2013  . Pneumococcal-Unspecified 10/06/2011    History  Substance Use Topics  . Smoking status: Current Every Day Smoker -- 1.00 packs/day  . Smokeless tobacco: Not on file  . Alcohol Use: No    Review of Systems  DATA OBTAINED: from nurse, GENERAL: Feels well no fevers, fatigue, appetite changes SKIN: breakdown R heel HEENT: No complaint RESPIRATORY: No cough, wheezing, SOB CARDIAC: No chest pain, palpitations, lower extremity edema  GI: No abdominal pain, No N/V/D or constipation, No heartburn or reflux  GU: No dysuria, frequency or urgency, or incontinence  MUSCULOSKELETAL: No unrelieved bone/joint pain NEUROLOGIC: No headache, dizziness or focal weakness PSYCHIATRIC: No overt anxiety or sadness. Sleeps well.   Filed Vitals:   01/30/14 2043  BP: 122/87  Pulse: 86  Temp: 98.2 F (36.8 C)  Resp: 17    Physical Exam  GENERAL APPEARANCE: Alert,min conversant. Appropriately groomed. No acute distress  SKIN: unopened ulceration R heel, large, and  med unopened ulcer L great toe HEENT: Unremarkable RESPIRATORY: Breathing is even, unlabored. Lung sounds are clear   CARDIOVASCULAR: Heart RRR no murmurs, rubs or gallops. No peripheral edema  GASTROINTESTINAL: Abdomen is soft, non-tender, not distended w/ normal bowel sounds.  GENITOURINARY: Bladder non tender, not distended  MUSCULOSKELETAL: No abnormal joints or  musculature NEUROLOGIC: Cranial nerves 2-12 grossly intact; L side weakness PSYCHIATRIC: dementia, no behavioral issues  Patient Active Problem List   Diagnosis Date Noted  . Atherosclerotic PVD with ulceration 01/30/2014  . Hyponatremia 05/25/2013  . Depression 02/17/2013  .  Hypertension   . Dementia without behavioral disturbance   . Anemia   . Bipolar 1 disorder   . Cerebrovascular accident   . Unspecified hypothyroidism 09/23/2012  . Other and unspecified hyperlipidemia 09/23/2012  . History of stroke with residual effects 11/26/2010  . Bipolar disease, chronic 11/26/2010  . Appendicitis 11/26/2010    CBC    Component Value Date/Time   WBC 7.8 07/24/2013 1651   RBC 4.22 07/24/2013 1651   HGB 12.8 07/24/2013 1651   HCT 36.1 07/24/2013 1651   PLT 220 07/24/2013 1651   MCV 85.5 07/24/2013 1651   LYMPHSABS 1.6 09/26/2010 0522   MONOABS 1.4* 09/26/2010 0522   EOSABS 0.1 09/26/2010 0522   BASOSABS 0.0 09/26/2010 0522    CMP     Component Value Date/Time   NA 128* 07/24/2013 1651   K 4.6 07/24/2013 1651   CL 89* 07/24/2013 1651   CO2 27 07/24/2013 1651   GLUCOSE 119* 07/24/2013 1651   BUN 7 07/24/2013 1651   CREATININE 0.78 07/24/2013 1651   CALCIUM 9.5 07/24/2013 1651   PROT 7.0 07/24/2013 1651   ALBUMIN 3.2* 07/24/2013 1651   AST 18 07/24/2013 1651   ALT 9 07/24/2013 1651   ALKPHOS 86 07/24/2013 1651   BILITOT 0.3 07/24/2013 1651   GFRNONAA 82* 07/24/2013 1651   GFRAA >90 07/24/2013 1651    Assessment and Plan  Atherosclerotic PVD with ulceration Pt has had a skin breakdown R heel. An arterial study ordered was reviewed by me and  showed PVD; Norco and  ultram (50 and 100) for all levels of pain ;appt with vascular surg ordered (has been ordered prior verbally but not done); breakdown L great toe is new to me, pt being tx by wound MD  Dementia without behavioral disturbance Will be a consideration for potential treatment; function continues to decline  Bipolar 1 disorder Will be a consideration  along with dementia for treatment options  Time spent with report, DON, pt and nursing > 35 min  Margit Hanks, MD

## 2014-01-30 NOTE — Assessment & Plan Note (Addendum)
Pt has had a skin breakdown R heel. An arterial study ordered was reviewed by me and  showed PVD; Norco and  ultram (50 and 100) for all levels of pain ;appt with vascular surg ordered (has been ordered prior verbally but not done); breakdown L great toe is new to me, pt being tx by wound MD

## 2014-01-31 ENCOUNTER — Encounter: Payer: Self-pay | Admitting: Internal Medicine

## 2014-02-04 ENCOUNTER — Encounter: Payer: Self-pay | Admitting: Internal Medicine

## 2014-02-04 NOTE — Assessment & Plan Note (Signed)
Will be a consideration along with dementia for treatment options

## 2014-02-04 NOTE — Assessment & Plan Note (Addendum)
Will be a consideration for potential treatment; function continues to decline

## 2014-03-07 ENCOUNTER — Encounter: Payer: Self-pay | Admitting: Vascular Surgery

## 2014-03-19 ENCOUNTER — Encounter: Payer: Self-pay | Admitting: Vascular Surgery

## 2014-03-20 ENCOUNTER — Ambulatory Visit (INDEPENDENT_AMBULATORY_CARE_PROVIDER_SITE_OTHER): Payer: Medicare Other | Admitting: Vascular Surgery

## 2014-03-20 ENCOUNTER — Encounter: Payer: Self-pay | Admitting: Vascular Surgery

## 2014-03-20 VITALS — BP 118/65 | HR 74 | Ht 63.0 in | Wt 170.0 lb

## 2014-03-20 DIAGNOSIS — L97519 Non-pressure chronic ulcer of other part of right foot with unspecified severity: Secondary | ICD-10-CM

## 2014-03-20 DIAGNOSIS — L97919 Non-pressure chronic ulcer of unspecified part of right lower leg with unspecified severity: Secondary | ICD-10-CM | POA: Insufficient documentation

## 2014-03-20 DIAGNOSIS — I739 Peripheral vascular disease, unspecified: Secondary | ICD-10-CM

## 2014-03-20 DIAGNOSIS — I639 Cerebral infarction, unspecified: Secondary | ICD-10-CM

## 2014-03-20 NOTE — Progress Notes (Signed)
Patient name: [name] MRN: [id number] DOB: 07/07/1940 Sex: female   Referred by: Renato Gailseed  Reason for referral:  Chief Complaint  Patient presents with  . New Evaluation    PAD    HISTORY OF PRESENT ILLNESS: The patient presents today for evaluation of arterial flow to her right foot. She is a very unfortunate nursing home resident with a history of stroke. She is here today with her daughter-in-law helps care for her. She is able to give no history. Daughter reported that she has had a several week history of ulceration her right heel. She is completely nonambulatory. She does not stand to transfer. She is maximal lift from bed to chair. She does report some tenderness over this area. She is wearing noncontact boots bilaterally.  Past Medical History  Diagnosis Date  . Hypertension   . Allergy   . Bipolar 1 disorder   . Dementia   . GERD (gastroesophageal reflux disease)   . Hyponatremia   . Schizoaffective disorder   . Cerebrovascular accident   . Anemia   . Thyroid disease     Past Surgical History  Procedure Laterality Date  . Appendectomy    . Fracture surgery      History   Social History  . Marital Status: Married    Spouse Name: N/A    Number of Children: N/A  . Years of Education: N/A   Occupational History  . Not on file.   Social History Main Topics  . Smoking status: Former Smoker -- 1.00 packs/day    Types: Cigarettes  . Smokeless tobacco: Not on file  . Alcohol Use: No  . Drug Use: No  . Sexual Activity: Not on file   Other Topics Concern  . Not on file   Social History Narrative    History reviewed. No pertinent family history.  Allergies as of 03/20/2014 - Review Complete 03/20/2014  Allergen Reaction Noted  . Demerol  11/20/2010  . Ampicillin  07/24/2013  . Aspirin  11/26/2010  . Codeine  11/20/2010  . Dilaudid [hydromorphone hcl]  11/20/2010  . Elavil [amitriptyline]  09/23/2012  . Flexeril [cyclobenzaprine hcl]   11/20/2010  . Nsaids  07/24/2013  . Other  07/24/2013  . Penicillins  11/20/2010    Current Outpatient Prescriptions on File Prior to Visit  Medication Sig Dispense Refill  . aspirin 81 MG EC tablet Take 81 mg by mouth daily.      . calcium-vitamin D (OSCAL WITH D) 500-200 MG-UNIT per tablet Take 1 tablet by mouth 2 (two) times daily.      . cholecalciferol (VITAMIN D) 1000 UNITS tablet Take 2,000 Units by mouth daily.    . clopidogrel (PLAVIX) 75 MG tablet Take 75 mg by mouth daily.      . divalproex (DEPAKOTE ER) 500 MG 24 hr tablet Take 1,000 mg by mouth daily.    Marland Kitchen. donepezil (ARICEPT) 10 MG tablet Take 10 mg by mouth at bedtime.     . DULoxetine (CYMBALTA) 60 MG capsule Take 60 mg by mouth daily.    . furosemide (LASIX) 20 MG tablet Take 20 mg by mouth daily.     Marland Kitchen. HYDROcodone-acetaminophen (NORCO) 5-325 MG per tablet Take 1 tablet by mouth every 4 (four) hours as needed (moderate pain, 2 tabs q 4 hours as needed for severe pain).     Marland Kitchen. levothyroxine (SYNTHROID, LEVOTHROID) 75 MCG tablet Take 125 mcg by mouth daily.     Marland Kitchen. lisinopril (  PRINIVIL,ZESTRIL) 2.5 MG tablet Take 2.5 mg by mouth daily.      Marland Kitchen LORazepam (ATIVAN) 0.5 MG tablet Take 1/2 tablet by mouth once daily for anxiety; Take 1/2 tablet by mouth twice daily as needed for breakthrough anxiety.    Marland Kitchen LORazepam (ATIVAN) 2 MG/ML injection Inject 0.28ml (1mg ) IM once and repeat as needed 1 mL 5  . Memantine HCl ER (NAMENDA XR) 28 MG CP24 Take 28 mg by mouth daily.    . Menthol-Zinc Oxide (REMEDY CALAZIME) 0.2-20 % PSTE Apply topically. Apply to buttocks each shift for preventive care after cleansing with normal saline    . Multiple Vitamin (MULTIVITAMIN) tablet Take 1 tablet by mouth daily.    . phenol (CHLORASEPTIC) 1.4 % LIQD Use as directed 1 spray in the mouth or throat every 6 (six) hours as needed for throat irritation / pain.     Bertram Gala Glycol-Propyl Glycol (SYSTANE) 0.4-0.3 % SOLN Place 1 drop into both eyes 2 (two) times  daily.    . polyethylene glycol powder (GLYCOLAX/MIRALAX) powder Take 17 g by mouth daily.    . potassium chloride (K-DUR) 10 MEQ tablet Take 10 mEq by mouth daily.     . propranolol (INDERAL) 10 MG tablet Take 10 mg by mouth 2 (two) times daily.      . ranitidine (ZANTAC) 75 MG tablet Take 150 mg by mouth at bedtime.     . risperiDONE (RISPERDAL) 0.25 MG tablet Take 0.25 mg by mouth at bedtime.     . senna (SENOKOT) 8.6 MG tablet Take 1 tablet by mouth 2 (two) times daily.     . simvastatin (ZOCOR) 20 MG tablet Take 40 mg by mouth at bedtime.     . sodium chloride 1 G tablet Take 1 g by mouth 3 (three) times daily.    . traMADol (ULTRAM) 50 MG tablet Take one tablet by mouth three times daily for pain 90 tablet 5   No current facility-administered medications on file prior to visit.     REVIEW OF SYSTEMS:  Negative except as noted in her past medical history   PHYSICAL EXAMINATION:  General: The patient is a well-nourished female,Lying on a stretcher. Talks but is unable to answer questions Vital signs are BP 118/65 mmHg  Pulse 74  Ht 5\' 3"  (1.6 m)  Wt 170 lb (77.111 kg)  BMI 30.12 kg/m2  SpO2 96% Pulmonary: There is a good air exchange Abdomen: Soft and non-tender  Musculoskeletal: There are no major deformities.   Neurologic: able to move both upper extremity. Difficult to assess her lower extremity motor function in the stretcher. Skin: superficial ulcer over her right heel approximately 2-3 cm in diameter Psychiatric: somewhat agitated. Repeats herself frequently. Cardiovascular: I do not palpate pedal pulses. He does have good biphasic Doppler flow in the dorsalis pedis bilaterally    Vascular Lab Studies:  I have results from an outlying vascular lab on 01/29/2014. This does show normal ankle arm index bilaterally. The waveform analysis suggested maybe some moderate peripheral vascular disease   Impression and Plan:  Her right heel does appear to be healing and she  does appear to have adequate flow for healing this. I discussed this at length with the daughter-in-law. I certainly would not recommend any further evaluation. Since she is completely nonambulatory would certainly consider primary amputation should she have progressive tissue loss. I do not feel this will be the case since she is having appropriate treatment with noncontact boot which is  critical. She will see us again on an as-needed basis    Charo Philipp [facility] [place] [place] Office: [phone]

## 2014-04-17 ENCOUNTER — Other Ambulatory Visit: Payer: Self-pay | Admitting: *Deleted

## 2014-04-17 MED ORDER — HYDROCODONE-ACETAMINOPHEN 5-325 MG PO TABS: ORAL_TABLET | ORAL | Status: DC

## 2014-04-17 NOTE — Telephone Encounter (Signed)
[name] 

## 2014-04-24 ENCOUNTER — Non-Acute Institutional Stay (SKILLED_NURSING_FACILITY): Payer: Medicare Other | Admitting: Adult Health

## 2014-04-24 ENCOUNTER — Encounter: Payer: Self-pay | Admitting: Adult Health

## 2014-04-24 DIAGNOSIS — R627 Adult failure to thrive: Secondary | ICD-10-CM

## 2014-04-24 DIAGNOSIS — F319 Bipolar disorder, unspecified: Secondary | ICD-10-CM

## 2014-04-24 DIAGNOSIS — G8929 Other chronic pain: Secondary | ICD-10-CM | POA: Insufficient documentation

## 2014-04-24 DIAGNOSIS — E871 Hypo-osmolality and hyponatremia: Secondary | ICD-10-CM

## 2014-04-24 DIAGNOSIS — E039 Hypothyroidism, unspecified: Secondary | ICD-10-CM

## 2014-04-24 DIAGNOSIS — F0390 Unspecified dementia without behavioral disturbance: Secondary | ICD-10-CM

## 2014-04-24 DIAGNOSIS — I10 Essential (primary) hypertension: Secondary | ICD-10-CM

## 2014-04-24 DIAGNOSIS — L98499 Non-pressure chronic ulcer of skin of other sites with unspecified severity: Secondary | ICD-10-CM

## 2014-04-24 DIAGNOSIS — I69354 Hemiplegia and hemiparesis following cerebral infarction affecting left non-dominant side: Secondary | ICD-10-CM | POA: Insufficient documentation

## 2014-04-24 DIAGNOSIS — I69854 Hemiplegia and hemiparesis following other cerebrovascular disease affecting left non-dominant side: Secondary | ICD-10-CM

## 2014-04-24 DIAGNOSIS — I739 Peripheral vascular disease, unspecified: Secondary | ICD-10-CM

## 2014-04-24 DIAGNOSIS — R634 Abnormal weight loss: Secondary | ICD-10-CM

## 2014-04-24 DIAGNOSIS — I70209 Unspecified atherosclerosis of native arteries of extremities, unspecified extremity: Secondary | ICD-10-CM

## 2014-04-24 DIAGNOSIS — I639 Cerebral infarction, unspecified: Secondary | ICD-10-CM

## 2014-04-24 MED ORDER — DIVALPROEX SODIUM 125 MG PO CPSP: 375.0000 mg | ORAL_CAPSULE | Freq: Two times a day (BID) | ORAL | Status: DC

## 2014-04-24 NOTE — Progress Notes (Signed)
Patient ID: [name], female   DOB: 02/11/1941, 73 y.o.   MRN: [id number]  [facility]     Allergies  Allergen Reactions  . Demerol     Per MAR.    Marland Kitchen. Ampicillin     Per facility [facility].   Marland Kitchen. Aspirin     Per facility [facility].    Marland Kitchen. Codeine     Per MAR.    . Dilaudid [Hydromorphone Hcl]     Per MAR.   Marland Kitchen. Elavil [Amitriptyline]     Per MAR.   Marland Kitchen. Flexeril [Cyclobenzaprine Hcl]     Per MAR.    . [facility]saids     [facility]    . Other     [facility]. [facility]    Marland Kitchen. Penicillins     Per MAR.         Chief Complaint  Patient presents with  . Medical Management of Chronic Issues    HPI:  She is a long term resident of this facility being seen for the management of her chronic illnesses. She is unable to participate in the hpi or ros except to say that her right leg hurts all the time. She has been losing weight.  Her current weight is 156 pounds; she weighed 185 pounds in June 2015. Her appetite is poor. She does act out at times; will take off her splint and throw it on the floor.    Past Medical History  Diagnosis Date  . Hypertension   . Allergy   . Bipolar 1 disorder   . Dementia   . GERD (gastroesophageal reflux disease)   . Hyponatremia   . Schizoaffective disorder   . Cerebrovascular accident   . Anemia   . Thyroid disease     Past Surgical History  Procedure Laterality Date  . Appendectomy    . Fracture surgery      VITAL SIGNS BP 98/57 mmHg  Pulse 61  Ht 5\' 3"  (1.6 m)  Wt 156 lb (70.761 kg)  BMI 27.64 kg/m2  SpO2 95%   Outpatient Encounter Prescriptions as of 04/24/2014  Medication Sig  . aspirin 81 MG EC tablet Take 81 mg by mouth daily.    . calcium-vitamin D (OSCAL WITH D) 500-200 MG-UNIT per tablet Take 1 tablet by mouth 2 (two) times daily.    . cholecalciferol (VITAMIN D) 1000 UNITS tablet Take 2,000 Units by mouth daily.  . clopidogrel (PLAVIX) 75 MG tablet Take 75 mg by mouth daily.    . divalproex (DEPAKOTE SPRINKLE) 125 MG  capsule Take 500 mg by mouth 2 (two) times daily.  Marland Kitchen. donepezil (ARICEPT) 10 MG tablet Take 10 mg by mouth at bedtime.   . DULoxetine (CYMBALTA) 60 MG capsule Take 60 mg by mouth daily.  . furosemide (LASIX) 20 MG tablet Take 20 mg by mouth daily.   Marland Kitchen. HYDROcodone-acetaminophen (NORCO/VICODIN) 5-325 MG per tablet Take one tablet by mouth every 6 hours for pain  . levothyroxine (SYNTHROID, LEVOTHROID) 75 MCG tablet Take 150 mcg by mouth daily.   Marland Kitchen. lisinopril (PRINIVIL,ZESTRIL) 2.5 MG tablet Take 2.5 mg by mouth daily.    Marland Kitchen. LORazepam (ATIVAN) 0.5 MG tablet Take 0.25 mg by mouth 2 (two) times daily as needed.   . Memantine HCl ER (NAMENDA XR) 28 MG CP24 Take 28 mg by mouth daily.  . Menthol-Zinc Oxide (REMEDY CALAZIME) 0.2-20 % PSTE Apply topically. Apply to buttocks each shift for preventive care after cleansing with normal saline  .  mirtazapine (REMERON) 7.5 MG tablet Take 7.5 mg by mouth at bedtime.  . Multiple Vitamin (MULTIVITAMIN) tablet Take 1 tablet by mouth daily.  Bertram Gala Glycol-Propyl Glycol (SYSTANE) 0.4-0.3 % SOLN Place 1 drop into both eyes 2 (two) times daily.  . polyethylene glycol powder (GLYCOLAX/MIRALAX) powder Take 17 g by mouth daily.  . potassium chloride (K-DUR) 10 MEQ tablet Take 10 mEq by mouth daily.   . propranolol (INDERAL) 10 MG tablet Take 10 mg by mouth 2 (two) times daily.    . ranitidine (ZANTAC) 75 MG tablet Take 150 mg by mouth at bedtime.   . risperiDONE (RISPERDAL) 0.25 MG tablet Take 0.25 mg by mouth at bedtime.   . senna (SENOKOT) 8.6 MG tablet Take 1 tablet by mouth 2 (two) times daily.   . simvastatin (ZOCOR) 20 MG tablet Take 40 mg by mouth at bedtime.   . sodium chloride 1 G tablet Take 1 g by mouth 3 (three) times daily.  . traMADol (ULTRAM) 50 MG tablet Take one tablet by mouth every 4 hours prn   . [DISCONTINUED] divalproex (DEPAKOTE ER) 500 MG 24 hr tablet Take 1,000 mg by mouth daily.  . [DISCONTINUED] LORazepam (ATIVAN) 2 MG/ML injection Inject  0.64ml (1mg ) IM once and repeat as needed (Patient not taking: Reported on 04/24/2014)  . [DISCONTINUED] phenol (CHLORASEPTIC) 1.4 % LIQD Use as directed 1 spray in the mouth or throat every 6 (six) hours as needed for throat irritation / pain.      SIGNIFICANT DIAGNOSTIC EXAMS  LABS REVIEWED:   01-31-14: wbc 6.6 ;hgb 12.9; hct 40.3; mcv 88.8; plt 297; glucose 118; bun 4.0; creat 0.49; k+3.5; na++134; liver normal albumin 2.9; tsh 10.90; depakote 59 02-02-14: urine culture: urine culture: e-coli: levaquin 03-09-14: tsh 11.06    Review of Systems  Unable to perform ROS    Physical Exam  Constitutional: No distress.  Neck: Neck supple. No JVD present. No thyromegaly present.  Cardiovascular: Normal rate, normal heart sounds and intact distal pulses.   Heart rate slightly irregular  Pedal pulses not palpable   Respiratory: Effort normal and breath sounds normal. No respiratory distress.  GI: Soft. Bowel sounds are normal. She exhibits no distension. There is no tenderness.  Musculoskeletal: She exhibits no edema.  Right upper extremity contracture Able to move left upper extremity Does not move lower extremities Has contracture in right lower extremity   Neurological: She is alert.  Skin: Skin is warm and dry. She is not diaphoretic.  Right heel: stage III: 3.4 x 2.4 cm 10% yellow slough 90% granulating tissue. No signs of infection present        ASSESSMENT/ PLAN:  1. Hypothyroidism: her last tsh in Oct was 11.06; will continue her synthroid 150 mcg daily and will check tsh and free t4 and free t3   2.  Edema: is presently on lasix 20 mg daily with 10 k+ meq daily will stop these medications at this time due to her poor po intake and will monitor her status  3. Dyslipidemia: is on zocor 40 mg daily will stop this medication due to her weight loss and will monitor her status.   4. Dementia with behavioral disturbance: she continues to decline is losing weight; which is a  progression of the disease state; is presently on aricept 10 mg daily and namenda xr 28 mg daily will stop the aricept due to her weight loss and will monitor her status.   5. Hypertension: is on propranolol 10  mg twice daily  And lisinopril 2.5 mg daily will stop the propranolol and will hold the lisinopril for systolic b/p <110 will monitor   6. CVA: with hemiparesis:  is without change neurologically; is on  asa 81 mg daily and plavix 75 mg daily will stop the asa due to allergy and will monitor   7. Constipation: will continue miralax daily; senna bid  8. Gerd: will continue zantac 150 mg nightly   9. Chronic bipolar disease:  Is without change; will continue cymbalta 60 mg daily; risperdal 0.25 mg nightly has ativan 0.25 mg twice daily as needed; is taking depakote sprinkles 500 mg twice daily will try lowering her depakote to 375 mg twice daily to help with her appetite and will monitor her status.   10. Chronic pain: does have pain in her right leg and is unable to fully describe her pain; this is related to her pvd: she is presently taking cymbalta 60 mg daily; takes vicodin 5/325 mg every 6 hours routinely; and has ultram 50 mg every every 4 hours as needed for pain. Will stop the ultram; I doubt this medication is helping.   11. PVD: due to her pain in her right leg will start her on neurontin 100 mg nightly and will make further adjustments based upon her response  12. Weight loss: FTT: she is presently taking remeron 7.5 mg nightly; she has continues to lose weight despite being on this medication; will stop medication at this time. Will monitor   13. Hyponatremia: will continue nacl 1 gm tid     Will check cbc; cmp depakote; hgb a1c; lipids; tsh free t3; tsh free t4; in 6 weeks check depakote level    Time spent with patient 50 minutes    Synthia Innocenteborah Green NP [facility]  Contact [phone] Monday through Friday 8am- 5pm  After hours call [phone]

## 2014-04-30 ENCOUNTER — Non-Acute Institutional Stay (SKILLED_NURSING_FACILITY): Payer: Medicare Other | Admitting: Adult Health

## 2014-04-30 DIAGNOSIS — F319 Bipolar disorder, unspecified: Secondary | ICD-10-CM

## 2014-04-30 DIAGNOSIS — R627 Adult failure to thrive: Secondary | ICD-10-CM

## 2014-04-30 DIAGNOSIS — I10 Essential (primary) hypertension: Secondary | ICD-10-CM

## 2014-05-02 ENCOUNTER — Encounter: Payer: Self-pay | Admitting: Adult Health

## 2014-05-02 MED ORDER — DIVALPROEX SODIUM 125 MG PO CPSP: 250.0000 mg | ORAL_CAPSULE | Freq: Two times a day (BID) | ORAL | Status: DC

## 2014-05-02 NOTE — Progress Notes (Signed)
Patient ID: Brenda AlarFannie L Crane, female   DOB: 04/22/1941, 73 y.o.   MRN: [id number]  [facility]     Allergies  Allergen Reactions  . Demerol     Per [facility].    Marland Kitchen. Ampicillin     Per facility [facility].   Marland Kitchen. Aspirin     Per facility [facility].    Marland Kitchen. Codeine     Per [facility].    . Dilaudid [Hydromorphone Hcl]     Per [facility].   Marland Kitchen. Elavil [Amitriptyline]     Per [facility].   Marland Kitchen. Flexeril [Cyclobenzaprine Hcl]     Per [facility].    . [facility]     [facility]    . Other     [facility]    Marland Kitchen. Penicillins     Per [facility].         Chief Complaint  Patient presents with  . Acute Visit    follow up lab work     HPI:  Her blood pressure readings are low. Her po intake remains poor. Her albumin is 2.8. She is failing. We need to come together with her family and discuss her overall status; and what her goals of care should be at this time. She is unable to participate in the hpi or ros.    Past Medical History  Diagnosis Date  . Hypertension   . Allergy   . Bipolar 1 disorder   . Dementia   . GERD (gastroesophageal reflux disease)   . Hyponatremia   . Schizoaffective disorder   . Cerebrovascular accident   . Anemia   . Thyroid disease     Past Surgical History  Procedure Laterality Date  . Appendectomy    . Fracture surgery      VITAL SIGNS BP 98/57 mmHg  Pulse 61  Ht 5\' 3"  (1.6 m)  Wt 156 lb (70.761 kg)  BMI 27.64 kg/m2  SpO2 95%   Outpatient Encounter Prescriptions as of 04/30/2014  Medication Sig  . calcium-vitamin D (OSCAL WITH D) 500-200 MG-UNIT per tablet Take 1 tablet by mouth 2 (two) times daily.    . cholecalciferol (VITAMIN D) 1000 UNITS tablet Take 2,000 Units by mouth daily.  . clopidogrel (PLAVIX) 75 MG tablet Take 75 mg by mouth daily.    . divalproex (DEPAKOTE SPRINKLE) 125 MG capsule Take 3 capsules (375 mg total) by mouth 2 (two) times daily.  . DULoxetine (CYMBALTA) 60 MG capsule Take 60 mg by mouth daily.  Marland Kitchen. HYDROcodone-acetaminophen (NORCO/VICODIN) 5-325  MG per tablet Take one tablet by mouth every 6 hours for pain  . levothyroxine (SYNTHROID, LEVOTHROID) 75 MCG tablet Take 125 mcg by mouth daily.   Marland Kitchen. lisinopril (PRINIVIL,ZESTRIL) 2.5 MG tablet Take 2.5 mg by mouth daily.    Marland Kitchen. LORazepam (ATIVAN) 0.5 MG tablet Take 0.25 mg by mouth 2 (two) times daily as needed.   . Memantine HCl ER (NAMENDA XR) 28 MG CP24 Take 28 mg by mouth daily.  . Menthol-Zinc Oxide (REMEDY CALAZIME) 0.2-20 % PSTE Apply topically. Apply to buttocks each shift for preventive care after cleansing with normal saline  . Multiple Vitamin (MULTIVITAMIN) tablet Take 1 tablet by mouth daily.  Bertram Gala. Polyethyl Glycol-Propyl Glycol (SYSTANE) 0.4-0.3 % SOLN Place 1 drop into both eyes 2 (two) times daily.  . polyethylene glycol powder (GLYCOLAX/MIRALAX) powder Take 17 g by mouth daily.  . ranitidine (ZANTAC) 75 MG tablet Take 150 mg by mouth at bedtime.   . risperiDONE (RISPERDAL) 0.25  MG tablet Take 0.25 mg by mouth at bedtime.   . senna (SENOKOT) 8.6 MG tablet Take 1 tablet by mouth 2 (two) times daily.   . sodium chloride 1 G tablet Take 1 g by mouth 3 (three) times daily.     SIGNIFICANT DIAGNOSTIC EXAMS   LABS REVIEWED:   01-31-14: wbc 6.6 ;hgb 12.9; hct 40.3; mcv 88.8; plt 297; glucose 118; bun 4.0; creat 0.49; k+3.5; na++134; liver normal albumin 2.9; tsh 10.90; depakote 59 02-02-14: urine culture: urine culture: e-coli: levaquin 03-09-14: tsh 11.06  04-25-14: wbc 9.0; hgb 10.3; hct 33.8; mcv95.4; plt 245; glucose 77; bun 15.4; creat 0.47; k+3.7; na++135; liver normal albumin 2.8; chol 129; ldl 77; trig 122    Review of Systems  Unable to perform ROS    Physical Exam  Constitutional: No distress.  Neck: Neck supple. No JVD present. No thyromegaly present.  Cardiovascular: Normal rate, normal heart sounds and intact distal pulses.   Heart rate slightly irregular  Pedal pulses not palpable   Respiratory: Effort normal and breath sounds normal. No respiratory distress.    GI: Soft. Bowel sounds are normal. She exhibits no distension. There is no tenderness.  Musculoskeletal: She exhibits no edema.  Right upper extremity contracture Able to move left upper extremity Does not move lower extremities Has contracture in right lower extremity   Neurological: She is alert.  Skin: Skin is warm and dry. She is not diaphoretic.  Right heel: stage III: 3.4 x 2.4 cm 10% yellow slough 90% granulating tissue. No signs of infection present        ASSESSMENT/ PLAN:  1. Hypertension: will stop the lisinopril at this time and will monitor her status.   2. Bipolar disorder: will lower her depakote to 250 mg twice daily and will monitor her status   3. FTT: will increase her propass to 1 scop twice daily and will monitor her status  Social service to setup family care plan meeting   Synthia InnocentDeborah Green NP [facility]  Contact [phone] Monday through Friday 8am- 5pm  After hours call [phone]

## 2014-05-15 ENCOUNTER — Other Ambulatory Visit: Payer: Self-pay | Admitting: *Deleted

## 2014-05-15 MED ORDER — HYDROCODONE-ACETAMINOPHEN 5-325 MG PO TABS: ORAL_TABLET | ORAL | Status: DC

## 2014-05-15 NOTE — Telephone Encounter (Signed)
[name] 

## 2014-05-25 ENCOUNTER — Non-Acute Institutional Stay: Payer: Medicare Other | Admitting: Adult Health

## 2014-05-25 DIAGNOSIS — F0390 Unspecified dementia without behavioral disturbance: Secondary | ICD-10-CM

## 2014-05-25 DIAGNOSIS — I10 Essential (primary) hypertension: Secondary | ICD-10-CM

## 2014-05-25 DIAGNOSIS — R634 Abnormal weight loss: Secondary | ICD-10-CM

## 2014-05-25 DIAGNOSIS — I639 Cerebral infarction, unspecified: Secondary | ICD-10-CM

## 2014-05-25 DIAGNOSIS — G8929 Other chronic pain: Secondary | ICD-10-CM

## 2014-05-25 DIAGNOSIS — E039 Hypothyroidism, unspecified: Secondary | ICD-10-CM

## 2014-05-25 DIAGNOSIS — E785 Hyperlipidemia, unspecified: Secondary | ICD-10-CM

## 2014-05-25 DIAGNOSIS — I739 Peripheral vascular disease, unspecified: Secondary | ICD-10-CM

## 2014-05-25 DIAGNOSIS — R627 Adult failure to thrive: Secondary | ICD-10-CM

## 2014-05-25 DIAGNOSIS — E871 Hypo-osmolality and hyponatremia: Secondary | ICD-10-CM

## 2014-05-25 DIAGNOSIS — F319 Bipolar disorder, unspecified: Secondary | ICD-10-CM

## 2014-05-25 DIAGNOSIS — I69354 Hemiplegia and hemiparesis following cerebral infarction affecting left non-dominant side: Secondary | ICD-10-CM

## 2014-05-25 DIAGNOSIS — I69854 Hemiplegia and hemiparesis following other cerebrovascular disease affecting left non-dominant side: Secondary | ICD-10-CM

## 2014-06-04 ENCOUNTER — Other Ambulatory Visit: Payer: Self-pay | Admitting: *Deleted

## 2014-06-04 MED ORDER — HYDROCODONE-ACETAMINOPHEN 5-325 MG PO TABS: ORAL_TABLET | ORAL | Status: DC

## 2014-06-04 NOTE — Telephone Encounter (Signed)
[name] 

## 2014-06-09 ENCOUNTER — Encounter: Payer: Self-pay | Admitting: Internal Medicine

## 2014-06-09 NOTE — Progress Notes (Signed)
This encounter was created in error - please disregard.

## 2014-06-15 ENCOUNTER — Encounter: Payer: Self-pay | Admitting: Adult Health

## 2014-06-15 NOTE — Progress Notes (Signed)
Patient ID: [name], female   DOB: 19-Sep-1940, 74 y.o.   MRN: [id number]  [facility]     Allergies  Allergen Reactions  . Demerol     Per [facility].    Marland Kitchen Ampicillin     Per facility [facility].   Marland Kitchen Aspirin     Per facility [facility].    Marland Kitchen Codeine     Per [facility].    . Dilaudid [Hydromorphone Hcl]     Per [facility].   Marland Kitchen Elavil [Amitriptyline]     Per [facility].   Marland Kitchen Flexeril [Cyclobenzaprine Hcl]     Per [facility].    . Nsaids     Per facility [facility].    . Other     COX2 inhibitors. Per facility [facility].    Marland Kitchen Penicillins     Per [facility].         Chief Complaint  Patient presents with  . Medical Management of Chronic Issues    HPI:  She is a long term resident of this facility being seen for the management of her chronic illnesses. She continues to decline. I have spoken with her family regarding her overall poor prognosis. Her family is interested in a palliative care consult at this time. She is unable to participate in the hpi or ros. There are no nursing concerns at this time.     Past Medical History  Diagnosis Date  . Hypertension   . Allergy   . Bipolar 1 disorder   . Dementia   . GERD (gastroesophageal reflux disease)   . Hyponatremia   . Schizoaffective disorder   . Cerebrovascular accident   . Anemia   . Thyroid disease     Past Surgical History  Procedure Laterality Date  . Appendectomy    . Fracture surgery      VITAL SIGNS BP 108/62 mmHg  Pulse 70  Ht  (1.6 m)  Wt 154 lb (69.854 kg)  BMI 27.29 kg/m2   Outpatient Encounter Prescriptions as of 05/25/2014  Medication Sig  . calcium-vitamin D (OSCAL WITH D) 500-200 MG-UNIT per tablet Take 1 tablet by mouth 2 (two) times daily.    . cholecalciferol (VITAMIN D) 1000 UNITS tablet Take 2,000 Units by mouth daily.  . clopidogrel (PLAVIX) 75 MG tablet Take 75 mg by mouth daily.    . divalproex (DEPAKOTE SPRINKLE) 125 MG capsule Take 2 capsules (250 mg total) by mouth 2 (two) times daily.  . DULoxetine (CYMBALTA) 60 MG capsule Take  60 mg by mouth daily.  Marland Kitchen gabapentin (NEURONTIN) 100 MG capsule Take 100 mg by mouth at bedtime.  Marland Kitchen HYDROcodone-acetaminophen (NORCO/VICODIN) 5-325 MG per tablet Take one tablet by mouth every 6 hours for pain. Not to exceed  of APAP from all sources/24h  . levothyroxine (SYNTHROID, LEVOTHROID) 75 MCG tablet Take 125 mcg by mouth daily.   Marland Kitchen LORazepam (ATIVAN) 0.5 MG tablet Take 0.25 mg by mouth 2 (two) times daily as needed.   . Memantine HCl ER (NAMENDA XR) 28 MG CP24 Take 28 mg by mouth daily.  . Menthol-Zinc Oxide (REMEDY CALAZIME) 0.2-20 % PSTE Apply topically. Apply to buttocks each shift for preventive care after cleansing with normal saline  . Multiple Vitamin (MULTIVITAMIN) tablet Take 1 tablet by mouth daily.  Bertram Gala Glycol-Propyl Glycol (SYSTANE) 0.4-0.3 % SOLN Place 1 drop into both eyes 2 (two) times daily.  . polyethylene glycol powder (GLYCOLAX/MIRALAX) powder Take 17 g by mouth daily.  . ranitidine (ZANTAC) 75 MG tablet Take  150 mg by mouth at bedtime.   . risperiDONE (RISPERDAL) 0.25 MG tablet Take 0.25 mg by mouth at bedtime.   . senna (SENOKOT) 8.6 MG tablet Take 1 tablet by mouth 2 (two) times daily.   . sodium chloride 1 G tablet Take 1 g by mouth 3 (three) times daily.     SIGNIFICANT DIAGNOSTIC EXAMS   LABS REVIEWED:   01-31-14: wbc 6.6 ;hgb 12.9; hct 40.3; mcv 88.8; plt 297; glucose 118; bun 4.0; creat 0.49; k+3.5; na++134; liver normal albumin 2.9; tsh 10.90; depakote 59 02-02-14: urine culture: urine culture: e-coli: levaquin 03-09-14: tsh 11.06  04-25-14: wbc 9.0; hgb 10.3; hct 33.8; mcv95.4; plt 245; glucose 77; bun 15.4; creat 0.47; k+3.7; na++135; liver normal albumin 2.8; chol 129; ldl 77; trig 122     Review of Systems  Unable to perform ROS    Physical Exam Constitutional: No distress.  Neck: Neck supple. No JVD present. No thyromegaly present.  Cardiovascular: Normal rate, normal heart sounds and intact distal pulses.   Heart rate slightly  irregular  Pedal pulses not palpable   Respiratory: Effort normal and breath sounds normal. No respiratory distress.  GI: Soft. Bowel sounds are normal. She exhibits no distension. There is no tenderness.  Musculoskeletal: She exhibits no edema.  Right upper extremity contracture Able to move left upper extremity Does not move lower extremities Has contracture in right lower extremity   Neurological: She is alert.  Skin: Skin is warm and dry. She is not diaphoretic.  Right heel: stage III: 3.4 x 2.4 cm 10% yellow slough 90% granulating tissue. No signs of infection present    ASSESSMENT/ PLAN:   1. Hypothyroidism: her last tsh in Oct was 11.06; will continue her synthroid 125 mcg daily will monitor her lab work is pending   2.  Edema: is off medications; is without change in status; will not make changes will monitor   3. Dyslipidemia: is off all medications at this time; will monitor   4. Dementia with behavioral disturbance: she continues to decline is losing weight; will continue namenda xr 28 mg daily; her weight loss has slowed down slightly; her current weight is 154 pounds; palliative care consult pending.   5. Hypertension: is presently off medications; will monitor   6. CVA: with hemiparesis:  No change in her neurological status; will monitor is off medications.   7. Constipation: will continue miralax daily; senna bid  8. Gerd: will continue zantac 150 mg nightly   9. Chronic bipolar disease:  Is without change; will continue cymbalta 60 mg daily; risperdal 0.25 mg nightly has ativan 0.25 mg twice daily as needed; is taking depakote sprinkles 250 mg twice daily will monitor   10. Chronic pain: does have pain in her right leg and is unable to fully describe her pain; this is related to her pvd: she is presently taking cymbalta 60 mg daily; takes vicodin 5/325 mg every 6 hours routinely.   11. PVD: due to her pain in her right leg will continue 100 mg nightly   12.  Weight loss: FTT: her current weight is 154 pounds; she has 2 pounds in the past month; her palliative care consult is pending will not make changes will monitor   13. Hyponatremia: will continue nacl 1 gm tid     Synthia Innocenteborah Eartha Vonbehren NP [facility]  Contact [phone] Monday through Friday 8am- 5pm  After hours call [phone]

## 2014-06-20 ENCOUNTER — Non-Acute Institutional Stay (SKILLED_NURSING_FACILITY): Payer: Medicare Other | Admitting: Adult Health

## 2014-06-20 DIAGNOSIS — R627 Adult failure to thrive: Secondary | ICD-10-CM | POA: Diagnosis not present

## 2014-06-20 DIAGNOSIS — I739 Peripheral vascular disease, unspecified: Secondary | ICD-10-CM

## 2014-06-20 DIAGNOSIS — G8929 Other chronic pain: Secondary | ICD-10-CM

## 2014-06-20 DIAGNOSIS — I10 Essential (primary) hypertension: Secondary | ICD-10-CM

## 2014-06-20 DIAGNOSIS — I639 Cerebral infarction, unspecified: Secondary | ICD-10-CM

## 2014-06-20 DIAGNOSIS — F0390 Unspecified dementia without behavioral disturbance: Secondary | ICD-10-CM

## 2014-06-20 DIAGNOSIS — I69854 Hemiplegia and hemiparesis following other cerebrovascular disease affecting left non-dominant side: Secondary | ICD-10-CM | POA: Diagnosis not present

## 2014-06-20 DIAGNOSIS — E039 Hypothyroidism, unspecified: Secondary | ICD-10-CM | POA: Diagnosis not present

## 2014-06-20 DIAGNOSIS — F319 Bipolar disorder, unspecified: Secondary | ICD-10-CM

## 2014-06-20 DIAGNOSIS — I69354 Hemiplegia and hemiparesis following cerebral infarction affecting left non-dominant side: Secondary | ICD-10-CM

## 2014-07-22 ENCOUNTER — Encounter: Payer: Self-pay | Admitting: Adult Health

## 2014-07-22 NOTE — Progress Notes (Signed)
Patient ID: [name], female   DOB: 04/24/1941, 74 y.o.   MRN: [id number]  [facility]     Allergies  Allergen Reactions  . Demerol     Per [facility].    Marland Kitchen. Ampicillin     Per facility [facility].   Marland Kitchen. Aspirin     Per facility [facility].    Marland Kitchen. Codeine     Per [facility].    . Dilaudid [Hydromorphone Hcl]     Per [facility].   Marland Kitchen. Elavil [Amitriptyline]     Per [facility].   Marland Kitchen. Flexeril [Cyclobenzaprine Hcl]     Per [facility].    . [facility]saids     [facility]    . Other     [facility]. [facility]    Marland Kitchen. Penicillins     Per [facility].         Chief Complaint  Patient presents with  . Medical Management of Chronic Issues    HPI:  She continues to decline; her appetite is low with poor fluid intake as well. She is not taking medications on a consistent basis. She is unable to participate in the hpi but does have pain present. I have spoken with her family; regarding her overall status and her poor prognosis. Her family does not desire for aggressive care; and would like a hospice care consult.    Past Medical History  Diagnosis Date  . Hypertension   . Allergy   . Bipolar 1 disorder   . Dementia   . GERD (gastroesophageal reflux disease)   . Hyponatremia   . Schizoaffective disorder   . Cerebrovascular accident   . Anemia   . Thyroid disease     Past Surgical History  Procedure Laterality Date  . Appendectomy    . Fracture surgery      VITAL SIGNS BP 108/56 mmHg  Pulse 62  Ht 5\' 3"  (1.6 m)  Wt 154 lb (69.854 kg)  BMI 27.29 kg/m2   Outpatient Encounter Prescriptions as of 06/20/2014  Medication Sig  . calcium-vitamin D (OSCAL WITH D) 500-200 MG-UNIT per tablet Take 1 tablet by mouth 2 (two) times daily.    . cholecalciferol (VITAMIN D) 1000 UNITS tablet Take 2,000 Units by mouth daily.  . clopidogrel (PLAVIX) 75 MG tablet Take 75 mg by mouth daily.    . divalproex (DEPAKOTE SPRINKLE) 125 MG capsule Take 2 capsules (250 mg total) by mouth 2 (two) times daily.  . DULoxetine (CYMBALTA) 60 MG  capsule Take 60 mg by mouth daily.  Marland Kitchen. gabapentin (NEURONTIN) 100 MG capsule Take 100 mg by mouth at bedtime.  Marland Kitchen. HYDROcodone-acetaminophen (NORCO/VICODIN) 5-325 MG per tablet Take one tablet by mouth every 6 hours for pain. Not to exceed 3000mg  of APAP from all sources/24h  . levothyroxine (SYNTHROID, LEVOTHROID) 75 MCG tablet Take 125 mcg by mouth daily.   Marland Kitchen. LORazepam (ATIVAN) 0.5 MG tablet Take 0.25 mg by mouth 2 (two) times daily as needed.   . Memantine HCl ER (NAMENDA XR) 28 MG CP24 Take 28 mg by mouth daily.  . Menthol-Zinc Oxide (REMEDY CALAZIME) 0.2-20 % PSTE Apply topically. Apply to buttocks each shift for preventive care after cleansing with normal saline  . Multiple Vitamin (MULTIVITAMIN) tablet Take 1 tablet by mouth daily.  Bertram Gala. Polyethyl Glycol-Propyl Glycol (SYSTANE) 0.4-0.3 % SOLN Place 1 drop into both eyes 2 (two) times daily.  . polyethylene glycol powder (GLYCOLAX/MIRALAX) powder Take 17 g by mouth daily.  . ranitidine (ZANTAC) 75 MG tablet  Take 150 mg by mouth at bedtime.   . risperiDONE (RISPERDAL) 0.25 MG tablet Take 0.25 mg by mouth at bedtime.   . senna (SENOKOT) 8.6 MG tablet Take 1 tablet by mouth 2 (two) times daily.   . sodium chloride 1 G tablet Take 1 g by mouth 3 (three) times daily.     SIGNIFICANT DIAGNOSTIC EXAMS   LABS REVIEWED:   01-31-14: wbc 6.6 ;hgb 12.9; hct 40.3; mcv 88.8; plt 297; glucose 118; bun 4.0; creat 0.49; k+3.5; na++134; liver normal albumin 2.9; tsh 10.90; depakote 59 02-02-14: urine culture: urine culture: e-coli: levaquin 03-09-14: tsh 11.06  04-25-14: wbc 9.0; hgb 10.3; hct 33.8; mcv95.4; plt 245; glucose 77; bun 15.4; creat 0.47; k+3.7; na++135; liver normal albumin 2.8; chol 129; ldl 77; trig 122  06-02-14: tsh 2.24      Review of Systems  Unable to perform ROS    Physical Exam Constitutional: No distress.  Neck: Neck supple. No JVD present. No thyromegaly present.  Cardiovascular: Normal rate, normal heart sounds and intact  distal pulses.   Heart rate slightly irregular  Pedal pulses not palpable   Respiratory: Effort normal and breath sounds normal. No respiratory distress.  GI: Soft. Bowel sounds are normal. She exhibits no distension. There is no tenderness.  Musculoskeletal: She exhibits no edema.  Right upper extremity contracture Able to move left upper extremity Does not move lower extremities Has contracture in right lower extremity   Neurological: She is alert.  Skin: Skin is warm and dry. She is not diaphoretic.  Right heel: stage III: 3.4 x 2.4 cm 10% yellow slough 90% granulating tissue. No signs of infection present    ASSESSMENT/ PLAN:   1. Hypothyroidism: tsh is 2.24  will stop her synthroid at this time;   2.  Edema: is off medications; is without change in status; will not make changes will monitor   3. Dyslipidemia: is off all medications at this time; will monitor   4. Dementia with behavioral disturbance: she continues to decline is losing weight; will stop the namenda and setup a hospice consult.   5. Hypertension: is presently off medications; will monitor   6. CVA: with hemiparesis:  No change in her neurological status; will monitor is off medications.   7. Constipation: will continue miralax daily; senna bid  8. Gerd: will stop zantac   9. Chronic bipolar disease: will stop her cymbalta; risperdal and depakote.   10. Chronic pain: does have pain in her right leg and is unable to fully describe her pain; this is related to her pvd: will stop her vicodin and will start roxanol 5 mg every 6 hours routinely and every 3 hours as needed will monitor   11. PVD: due to her pain in her right leg will stop all medications   12. Weight loss: FTT: will setup a hospice consult; will focus her care upon comfort only   13. Hyponatremia: will stop the nacl at this time.   After speaking with her family will change her care to focus upon comfort and will setup a hospice consult and  will continue to monitor her status.    Time spent with patient 50 minutes.       Synthia Innocent NP [facility]  Contact [phone] Monday through Friday 8am- 5pm  After hours call [phone]

## 2014-07-25 ENCOUNTER — Non-Acute Institutional Stay (SKILLED_NURSING_FACILITY): Payer: Medicare Other | Admitting: Adult Health

## 2014-07-25 DIAGNOSIS — I69354 Hemiplegia and hemiparesis following cerebral infarction affecting left non-dominant side: Secondary | ICD-10-CM

## 2014-07-25 DIAGNOSIS — F319 Bipolar disorder, unspecified: Secondary | ICD-10-CM | POA: Diagnosis not present

## 2014-07-25 DIAGNOSIS — G8929 Other chronic pain: Secondary | ICD-10-CM

## 2014-07-25 DIAGNOSIS — R627 Adult failure to thrive: Secondary | ICD-10-CM

## 2014-07-25 DIAGNOSIS — L98499 Non-pressure chronic ulcer of skin of other sites with unspecified severity: Secondary | ICD-10-CM | POA: Diagnosis not present

## 2014-07-25 DIAGNOSIS — I639 Cerebral infarction, unspecified: Secondary | ICD-10-CM | POA: Diagnosis not present

## 2014-07-25 DIAGNOSIS — E039 Hypothyroidism, unspecified: Secondary | ICD-10-CM

## 2014-07-25 DIAGNOSIS — I69854 Hemiplegia and hemiparesis following other cerebrovascular disease affecting left non-dominant side: Secondary | ICD-10-CM | POA: Diagnosis not present

## 2014-07-25 DIAGNOSIS — I70209 Unspecified atherosclerosis of native arteries of extremities, unspecified extremity: Secondary | ICD-10-CM

## 2014-07-25 DIAGNOSIS — I739 Peripheral vascular disease, unspecified: Secondary | ICD-10-CM | POA: Diagnosis not present

## 2014-07-25 DIAGNOSIS — E785 Hyperlipidemia, unspecified: Secondary | ICD-10-CM

## 2014-07-25 DIAGNOSIS — I10 Essential (primary) hypertension: Secondary | ICD-10-CM

## 2014-07-25 DIAGNOSIS — F0390 Unspecified dementia without behavioral disturbance: Secondary | ICD-10-CM | POA: Diagnosis not present

## 2014-08-16 ENCOUNTER — Non-Acute Institutional Stay (SKILLED_NURSING_FACILITY): Payer: Medicare Other | Admitting: Adult Health

## 2014-08-16 DIAGNOSIS — F319 Bipolar disorder, unspecified: Secondary | ICD-10-CM | POA: Diagnosis not present

## 2014-08-16 DIAGNOSIS — G8929 Other chronic pain: Secondary | ICD-10-CM | POA: Diagnosis not present

## 2014-08-27 ENCOUNTER — Non-Acute Institutional Stay (SKILLED_NURSING_FACILITY): Payer: Medicare Other | Admitting: Internal Medicine

## 2014-08-27 ENCOUNTER — Encounter: Payer: Self-pay | Admitting: Internal Medicine

## 2014-08-27 DIAGNOSIS — F0150 Vascular dementia without behavioral disturbance: Secondary | ICD-10-CM

## 2014-08-27 DIAGNOSIS — I69354 Hemiplegia and hemiparesis following cerebral infarction affecting left non-dominant side: Secondary | ICD-10-CM

## 2014-08-27 DIAGNOSIS — E039 Hypothyroidism, unspecified: Secondary | ICD-10-CM | POA: Diagnosis not present

## 2014-08-27 DIAGNOSIS — R627 Adult failure to thrive: Secondary | ICD-10-CM | POA: Diagnosis not present

## 2014-08-27 DIAGNOSIS — I10 Essential (primary) hypertension: Secondary | ICD-10-CM | POA: Diagnosis not present

## 2014-08-27 DIAGNOSIS — F319 Bipolar disorder, unspecified: Secondary | ICD-10-CM | POA: Diagnosis not present

## 2014-08-27 DIAGNOSIS — I69854 Hemiplegia and hemiparesis following other cerebrovascular disease affecting left non-dominant side: Secondary | ICD-10-CM

## 2014-08-27 DIAGNOSIS — K219 Gastro-esophageal reflux disease without esophagitis: Secondary | ICD-10-CM

## 2014-08-27 DIAGNOSIS — I739 Peripheral vascular disease, unspecified: Secondary | ICD-10-CM

## 2014-08-27 NOTE — Progress Notes (Signed)
Patient ID: [name], female   DOB: 03/17/1941, 74 y.o.   MRN: [id number]    [facility]   CODE STATUS: DNR  Allergies  Allergen Reactions  . Demerol     Per [facility].    Marland Kitchen Ampicillin     Per facility [facility].   Marland Kitchen Aspirin     Per facility [facility].    Marland Kitchen Codeine     Per [facility].    . Dilaudid [Hydromorphone Hcl]     Per [facility].   Marland Kitchen Elavil [Amitriptyline]     Per [facility].   Marland Kitchen Flexeril [Cyclobenzaprine Hcl]     Per [facility].    . Nsaids     Per facility [facility].    . Other     COX2 inhibitors. Per facility [facility].    Marland Kitchen Penicillins     Per [facility].      Chief Complaint  Patient presents with  . Medical Management of Chronic Issues    HPI:  74 yo female long term resident seen today for f/u. She has dementia and unable to provide hx. Hx obtained from chart. She has no c/o. No nursing issues. She has a poor appetite. Sleeping well.  No exac of bipolar d/o. She is taking depakote, cymbalta, risperdal, lorazepam.  Thyroid controlled on levothyroxine.  GERD is stable on zantac.  She takes no medication for BP and readings have been stable.  She has hx CVA and takes plavix.  Chronic pain - stable on norco  Past Medical History  Diagnosis Date  . Hypertension   . Allergy   . Bipolar 1 disorder   . Dementia   . GERD (gastroesophageal reflux disease)   . Hyponatremia   . Schizoaffective disorder   . Cerebrovascular accident   . Anemia   . Thyroid disease    Past Surgical History  Procedure Laterality Date  . Appendectomy    . Fracture surgery     History   Social History  . Marital Status: Married    Spouse Name: N/A  . Number of Children: N/A  . Years of Education: N/A   Social History Main Topics  . Smoking status: Former Smoker -- 1.00 packs/day    Types: Cigarettes  . Smokeless tobacco: Not on file  . Alcohol Use: No  . Drug Use: No  . Sexual Activity: Not on file   Other Topics Concern  . None   Social History  Narrative     Medications: Patient's Medications  New Prescriptions   No medications on file  Previous Medications   CALCIUM-VITAMIN D (OSCAL WITH D) 500-200 MG-UNIT PER TABLET    Take 1 tablet by mouth 2 (two) times daily.     CHOLECALCIFEROL (VITAMIN D) 1000 UNITS TABLET    Take 2,000 Units by mouth daily.   CLOPIDOGREL (PLAVIX) 75 MG TABLET    Take 75 mg by mouth daily.     DIVALPROEX (DEPAKOTE SPRINKLE) 125 MG CAPSULE    Take 2 capsules (250 mg total) by mouth 2 (two) times daily.   DULOXETINE (CYMBALTA) 60 MG CAPSULE    Take 60 mg by mouth daily.   GABAPENTIN (NEURONTIN) 100 MG CAPSULE    Take 100 mg by mouth at bedtime.   HYDROCODONE-ACETAMINOPHEN (NORCO/VICODIN) 5-325 MG PER TABLET    Take one tablet by mouth every 6 hours for pain. Not to exceed 3073m of APAP from all sources/24h   LEVOTHYROXINE (SYNTHROID,  LEVOTHROID) 75 MCG TABLET    Take 125 mcg by mouth daily.    LORAZEPAM (ATIVAN) 0.5 MG TABLET    Take 0.25 mg by mouth 2 (two) times daily as needed.    MEMANTINE HCL ER (NAMENDA XR) 28 MG CP24    Take 28 mg by mouth daily.   MENTHOL-ZINC OXIDE (REMEDY CALAZIME) 0.2-20 % PSTE    Apply topically. Apply to buttocks each shift for preventive care after cleansing with normal saline   MULTIPLE VITAMIN (MULTIVITAMIN) TABLET    Take 1 tablet by mouth daily.   POLYETHYL GLYCOL-PROPYL GLYCOL (SYSTANE) 0.4-0.3 % SOLN    Place 1 drop into both eyes 2 (two) times daily.   POLYETHYLENE GLYCOL POWDER (GLYCOLAX/MIRALAX) POWDER    Take 17 g by mouth daily.   RANITIDINE (ZANTAC) 75 MG TABLET    Take 150 mg by mouth at bedtime.    RISPERIDONE (RISPERDAL) 0.25 MG TABLET    Take 0.25 mg by mouth at bedtime.    SENNA (SENOKOT) 8.6 MG TABLET    Take 1 tablet by mouth 2 (two) times daily.    SODIUM CHLORIDE 1 G TABLET    Take 1 g by mouth 3 (three) times daily.  Modified Medications   No medications on file  Discontinued Medications   No medications on file     Review of Systems  Unable to  perform ROS: Dementia    Filed Vitals:   08/27/14 1802  BP: 122/64  Pulse: 91  Temp: 98.5 F (36.9 C)   There is no weight on file to calculate BMI.  Physical Exam  Constitutional: She appears well-developed. No distress.  Frail appearing in NAD. Awake and alert. Lying in bed watching TV and applying lipstick  HENT:  Mouth/Throat: Oropharynx is clear and moist. No oropharyngeal exudate.  Eyes: Pupils are equal, round, and reactive to light. No scleral icterus.  Neck: Neck supple. No tracheal deviation present. No thyromegaly present.  Cardiovascular: Normal rate, regular rhythm, normal heart sounds and intact distal pulses.  Exam reveals no gallop and no friction rub.   No murmur heard. No LE edema b/l. no calf TTP. No carotid bruit b/l  Pulmonary/Chest: Effort normal and breath sounds normal. No stridor. No respiratory distress. She has no wheezes. She has no rales.  Abdominal: Soft. Bowel sounds are normal. She exhibits no distension and no mass. There is no tenderness. There is no rebound and no guarding.  Musculoskeletal:  LUE contracture  Lymphadenopathy:    She has no cervical adenopathy.  Neurological: She is alert.  Skin: Skin is warm and dry. No rash noted.  Psychiatric: She has a normal mood and affect. Her behavior is normal. Judgment and thought content normal.     Labs reviewed: No visits with results within 3 Month(s) from this visit. Latest known visit with results is:  Admission on 07/24/2013, Discharged on 07/24/2013  Component Date Value Ref Range Status  . Troponin i, poc 07/24/2013 0.01  0.00 - 0.08 ng/mL Final  . Comment 3 07/24/2013          Final   Comment: Due to the release kinetics of cTnI,                          a negative result within the first hours                          of  the onset of symptoms does not rule out                          myocardial infarction with certainty.                          If myocardial infarction is still  suspected,                          repeat the test at appropriate intervals.  . Color, Urine 07/24/2013 YELLOW  YELLOW Final  . APPearance 07/24/2013 CLEAR  CLEAR Final  . Specific Gravity, Urine 07/24/2013 1.014  1.005 - 1.030 Final  . pH 07/24/2013 7.5  5.0 - 8.0 Final  . Glucose, UA 07/24/2013 NEGATIVE  NEGATIVE mg/dL Final  . Hgb urine dipstick 07/24/2013 NEGATIVE  NEGATIVE Final  . Bilirubin Urine 07/24/2013 NEGATIVE  NEGATIVE Final  . Ketones, ur 07/24/2013 NEGATIVE  NEGATIVE mg/dL Final  . Protein, ur 07/24/2013 NEGATIVE  NEGATIVE mg/dL Final  . Urobilinogen, UA 07/24/2013 1.0  0.0 - 1.0 mg/dL Final  . Nitrite 07/24/2013 NEGATIVE  NEGATIVE Final  . Leukocytes, UA 07/24/2013 NEGATIVE  NEGATIVE Final   MICROSCOPIC NOT DONE ON URINES WITH NEGATIVE PROTEIN, BLOOD, LEUKOCYTES, NITRITE, OR GLUCOSE <1000 mg/dL.  . WBC 07/24/2013 7.8  4.0 - 10.5 K/uL Final   Comment: WHITE COUNT CONFIRMED ON SMEAR                          ADJUSTED FOR NUCLEATED RBC'S  . RBC 07/24/2013 4.22  3.87 - 5.11 MIL/uL Final  . Hemoglobin 07/24/2013 12.8  12.0 - 15.0 g/dL Final  . HCT 07/24/2013 36.1  36.0 - 46.0 % Final  . MCV 07/24/2013 85.5  78.0 - 100.0 fL Final  . MCH 07/24/2013 30.3  26.0 - 34.0 pg Final  . MCHC 07/24/2013 35.5  30.0 - 36.0 g/dL Final  . RDW 07/24/2013 13.1  11.5 - 15.5 % Final  . Platelets 07/24/2013 220  150 - 400 K/uL Final  . Sodium 07/24/2013 128* 137 - 147 mEq/L Final  . Potassium 07/24/2013 4.6  3.7 - 5.3 mEq/L Final  . Chloride 07/24/2013 89* 96 - 112 mEq/L Final  . CO2 07/24/2013 27  19 - 32 mEq/L Final  . Glucose, Bld 07/24/2013 119* 70 - 99 mg/dL Final  . BUN 07/24/2013 7  6 - 23 mg/dL Final  . Creatinine, Ser 07/24/2013 0.78  0.50 - 1.10 mg/dL Final  . Calcium 07/24/2013 9.5  8.4 - 10.5 mg/dL Final  . Total Protein 07/24/2013 7.0  6.0 - 8.3 g/dL Final  . Albumin 07/24/2013 3.2* 3.5 - 5.2 g/dL Final  . AST 07/24/2013 18  0 - 37 U/L Final   NO VISIBLE HEMOLYSIS  . ALT  07/24/2013 9  0 - 35 U/L Final  . Alkaline Phosphatase 07/24/2013 86  39 - 117 U/L Final  . Total Bilirubin 07/24/2013 0.3  0.3 - 1.2 mg/dL Final  . GFR calc non Af Amer 07/24/2013 82* >90 mL/min Final  . GFR calc Af Amer 07/24/2013 >90  >90 mL/min Final   Comment: (NOTE)                          The eGFR has been calculated using the CKD EPI equation.  This calculation has not been validated in all clinical situations.                          eGFR's persistently <90 mL/min signify possible Chronic Kidney                          Disease.  . Valproic Acid Lvl 07/24/2013 67.1  50.0 - 100.0 ug/mL Final   Performed at [facility]     Assessment/Plan   ICD-9-CM ICD-10-CM   1. Vascular dementia without behavioral disturbance - stable; cont namenda xr 290.40 F01.50   2. Essential hypertension - controlled 401.9 I10   3. Hemiparesis affecting left side as late effect of stroke - stable; cont plavix 438.20 I69.854   4. FTT (failure to thrive) in adult - cont nutritional supplement 783.7 R62.7   5. PVD (peripheral vascular disease) - on plavix 443.9 I73.9   6. Bipolar 1 disorder - stable; cont risperdal, ativan, cymbalta, depakote 296.7 F31.9   7. Hypothyroidism, unspecified hypothyroidism type - stable; cont levothyroxine 244.9 E03.9   8. Gastroesophageal reflux disease without esophagitis - stable; cont zantac 530.81 K21.9   9. Chronic pain syndrome - cont norco, cymbalta and neurontin  --keep legs elevated to prevent swelling  --Pt is medically stable on current tx plan. Continue current medications as ordered.  Will follow   Ineze Serrao S. Perlie Gold  [facility] [address] [place], [place] [address] [phone] Office (Wednesdays and Fridays 8 AM - 5 PM) [phone] Cell (Monday-Friday 8 AM - 5 PM)

## 2014-08-31 NOTE — Progress Notes (Signed)
Patient ID: [name], female   DOB: 11/30/1940, 74 y.o.   MRN: [id number]  [facility]     Allergies  Allergen Reactions  . Demerol     Per [facility].    Marland Kitchen. Ampicillin     Per facility [facility].   Marland Kitchen. Aspirin     Per facility [facility].    Marland Kitchen. Codeine     Per [facility].    . Dilaudid [Hydromorphone Hcl]     Per [facility].   Marland Kitchen. Elavil [Amitriptyline]     Per [facility].   Marland Kitchen. Flexeril [Cyclobenzaprine Hcl]     Per [facility].    . [facility]    . Other     [facility]    Marland Kitchen. Penicillins     Per [facility].         Chief Complaint  Patient presents with  . Annual Exam    HPI:  She is a long term resident of this facility being seen for her annual exam. She has had a significantly decline over the past year. She is now followed by hospice care with her focus of care being comfort only. Her medications were stopped as she had stopped taking them. She is more alert but is unable to participate in the hpi or ros.    Past Medical History  Diagnosis Date  . Hypertension   . Allergy   . Bipolar 1 disorder   . Dementia   . GERD (gastroesophageal reflux disease)   . Hyponatremia   . Schizoaffective disorder   . Cerebrovascular accident   . Anemia   . Thyroid disease     Past Surgical History  Procedure Laterality Date  . Appendectomy    . Fracture surgery      VITAL SIGNS BP 110/57 mmHg  Pulse 68  Ht 5\' 4"  (1.626 m)  Wt 144 lb (65.318 kg)  BMI 24.71 kg/m2   Outpatient Encounter Prescriptions as of 07/25/2014  Medication Sig  . morphine (ROXANOL) 20 MG/ML concentrated solution Take 5 mg by mouth every 3 (three) hours as needed for severe pain. Takes every 6 hours routinely as well     SIGNIFICANT DIAGNOSTIC EXAMS not a candidate for mammogram; dexa or colonoscopy   LABS REVIEWED:   01-31-14: wbc 6.6 ;hgb 12.9; hct 40.3; mcv 88.8; plt 297; glucose 118; bun 4.0; creat 0.49; k+3.5; na++134; liver normal albumin 2.9; tsh 10.90; depakote 59 02-02-14: urine culture: urine  culture: e-coli: levaquin 03-09-14: tsh 11.06  04-25-14: wbc 9.0; hgb 10.3; hct 33.8; mcv95.4; plt 245; glucose 77; bun 15.4; creat 0.47; k+3.7; na++135; liver normal albumin 2.8; chol 129; ldl 77; trig 122  06-02-14: tsh 2.24  06-05-14: depakote 32      ROS Unable to perform ROS   Physical Exam Constitutional: No distress.  Neck: Neck supple. No JVD present. No thyromegaly present.  Cardiovascular: Normal rate, normal heart sounds and intact distal pulses.   Heart rate slightly irregular  Pedal pulses not palpable   Respiratory: Effort normal and breath sounds normal. No respiratory distress.  GI: Soft. Bowel sounds are normal. She exhibits no distension. There is no tenderness.  Musculoskeletal: She exhibits no edema.  Right upper extremity contracture especially in the wrist  Able to move left upper extremity Does not move lower extremities Has contracture in right lower extremity   Neurological: She is alert.  Skin: Skin is warm and dry. She is not diaphoretic.  Right heel: stage  III:  No signs of infection present    ASSESSMENT/ PLAN:   1. Hypothyroidism: tsh is 2.24  will not on medications.   2.  Edema: is off medications; is without change in status; will not make changes will monitor   3. Dyslipidemia: is off all medications at this time; will monitor   4. Dementia with behavioral disturbance: she continues to decline is losing weight; is off medications; will not make changes will monitor  5. Hypertension: is presently off medications; will monitor   6. CVA: with hemiparesis:  No change in her neurological status; will monitor is off medications.   7. Constipation: is presently not taking medications will monitor   8. Gerd: will not make changes  9. Chronic bipolar disease: is not on medications; at this time is not agitated; will ,monitor    10. Chronic pain: does have pain in her right leg and is unable to fully describe her pain; this is related to her  pvd: will continue  roxanol 5 mg every 6 hours routinely and every 3 hours as needed will monitor   11. PVD: has pain in her right leg; will continue roxanol    12. FTT is followed by hospice care the focus of her care is comfort.    Her health care maintanence is up to date.     Synthia Innocent NP [facility]  Contact [phone] Monday through Friday 8am- 5pm  After hours call [phone]

## 2014-09-06 ENCOUNTER — Non-Acute Institutional Stay (SKILLED_NURSING_FACILITY): Payer: Medicare Other | Admitting: Adult Health

## 2014-09-06 DIAGNOSIS — F319 Bipolar disorder, unspecified: Secondary | ICD-10-CM

## 2014-09-12 ENCOUNTER — Encounter: Payer: Self-pay | Admitting: *Deleted

## 2014-09-19 ENCOUNTER — Encounter: Payer: Self-pay | Admitting: Adult Health

## 2014-09-19 ENCOUNTER — Non-Acute Institutional Stay (SKILLED_NURSING_FACILITY): Payer: Medicare Other | Admitting: Adult Health

## 2014-09-19 DIAGNOSIS — I639 Cerebral infarction, unspecified: Secondary | ICD-10-CM

## 2014-09-19 DIAGNOSIS — I69854 Hemiplegia and hemiparesis following other cerebrovascular disease affecting left non-dominant side: Secondary | ICD-10-CM

## 2014-09-19 DIAGNOSIS — G8929 Other chronic pain: Secondary | ICD-10-CM

## 2014-09-19 DIAGNOSIS — R627 Adult failure to thrive: Secondary | ICD-10-CM | POA: Diagnosis not present

## 2014-09-19 DIAGNOSIS — I69354 Hemiplegia and hemiparesis following cerebral infarction affecting left non-dominant side: Secondary | ICD-10-CM

## 2014-09-19 DIAGNOSIS — F0390 Unspecified dementia without behavioral disturbance: Secondary | ICD-10-CM

## 2014-09-19 DIAGNOSIS — F319 Bipolar disorder, unspecified: Secondary | ICD-10-CM

## 2014-09-19 MED ORDER — RISPERIDONE 0.25 MG PO TABS: 0.2500 mg | ORAL_TABLET | Freq: Every day | ORAL | Status: DC

## 2014-09-19 NOTE — Progress Notes (Signed)
Patient ID: [name], female   DOB: 03/13/1941, 74 y.o.   MRN: [id number]  [facility]     Allergies  Allergen Reactions  . Demerol     Per MAR.    Marland Kitchen. Ampicillin     Per facility [facility].   Marland Kitchen. Aspirin     Per facility [facility].    Marland Kitchen. Codeine     Per [facility].    . Dilaudid [Hydromorphone Hcl]     Per MAR.   Marland Kitchen. Elavil [Amitriptyline]     Per [facility].   Marland Kitchen. Flexeril [Cyclobenzaprine Hcl]     Per [facility].    . [facility]    . Other     [facility]    Marland Kitchen. Penicillins     Per [facility].         Chief Complaint  Patient presents with  . Medical Management of Chronic Issues    HPI:  She is a long term resident of this facility being seen for the management of her chronic illnesses. Since restarting the risperdal she is having significant fewer episodes of agitation; anger; yelling out. Per the nursing staff she is not overly sedated or lethargic. Her splint for her left hand/wrist has come in and she will require to be seen by OT for placement and evaluation of splinting. She has gained weight over the past month from 144 pounds on 08-16-14 to 148 pounds.  She continues to be followed by hospice care.    Past Medical History  Diagnosis Date  . Hypertension   . Allergy   . Bipolar 1 disorder   . Dementia   . GERD (gastroesophageal reflux disease)   . Hyponatremia   . Schizoaffective disorder   . Cerebrovascular accident   . Anemia   . Thyroid disease     Past Surgical History  Procedure Laterality Date  . Appendectomy    . Fracture surgery      VITAL SIGNS BP 117/65 mmHg  Pulse 82  Ht 5\' 4"  (1.626 m)  Wt 148 lb (67.132 kg)  BMI 25.39 kg/m2  SpO2 97%   Outpatient Encounter Prescriptions as of 09/19/2014  Medication Sig  . Amino Acids-Protein Hydrolys (FEEDING SUPPLEMENT, PRO-STAT SUGAR FREE 64,) LIQD Take 30 mLs by mouth 2 (two) times daily with a meal.  . Polyethyl Glycol-Propyl Glycol 0.4-0.3 % SOLN Apply 1 drop to eye 2 (two) times daily.  Both eyes  . divalproex (DEPAKOTE SPRINKLE) 125 MG capsule Take 250 mg by mouth 2 (two) times daily. For anxiety disorder  . HYDROcodone-acetaminophen (NORCO/VICODIN) 5-325 MG per tablet Take 1 tablet by mouth every 6 (six) hours as needed for moderate pain. For pain  . risperiDONE (RISPERDAL) 0.25 MG tablet Take 1 tablet (0.25 mg total) by mouth at bedtime.      SIGNIFICANT DIAGNOSTIC EXAMS not a candidate for mammogram; dexa or colonoscopy  07-16-14: left forearm x-ray: no acute fracture  07-16-14: left humerus x-ray: no acute fracture    LABS REVIEWED:   01-31-14: wbc 6.6 ;hgb 12.9; hct 40.3; mcv 88.8; plt 297; glucose 118; bun 4.0; creat 0.49; k+3.5; na++134; liver normal albumin 2.9; tsh 10.90; depakote 59 02-02-14: urine culture: urine culture: e-coli: levaquin 03-09-14: tsh 11.06  04-25-14: wbc 9.0; hgb 10.3; hct 33.8; mcv95.4; plt 245; glucose 77; bun 15.4; creat 0.47; k+3.7; na++135; liver normal albumin 2.8; chol 129; ldl 77; trig 122  06-02-14: tsh 2.24  06-05-14: depakote 32  ROS Unable to perform ROS   Physical Exam Constitutional: No distress.  Neck: Neck supple. No JVD present. No thyromegaly present.  Cardiovascular: Normal rate, normal heart sounds and intact distal pulses.   Heart rate slightly irregular  Pedal pulses not palpable   Respiratory: Effort normal and breath sounds normal. No respiratory distress.  GI: Soft. Bowel sounds are normal. She exhibits no distension. There is no tenderness.  Musculoskeletal: She exhibits no edema.  Right upper extremity contracture especially in the wrist  Able to move left upper extremity Does not move lower extremities Has contracture in right lower extremity   Neurological: She is alert.  Skin: Skin is warm and dry. She is not diaphoretic.  Right heel: stage III:  Resolved     ASSESSMENT/ PLAN:  1. FTT/weight loss: will continue her supplements per the facility protocol. Her current weight is 148 pounds up from  144 pounds on 08-16-14. Will not make changes and will monitor her status.   2. Chronic pain: has contractures present will be seen by OT for splinting of her left wrist/hand. Will continue vicodin 5.3265 mg every 6 hours routinely and will monitor   3. Bipolar disorder: will continue her depakote 250 mg twice daily and risperdal 0.25 mg nightly and will monitor her status.   4. Dementia: is end stage; is presently not on medications; she is followed by hospice care; her weight is stable at 148 pounds; will continue to monitor her status.   5. 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 Will monitor     Synthia Innocenteborah Lakira Ogando NP [facility]  Contact [phone] Monday through Friday 8am- 5pm  After hours call [phone]

## 2014-09-19 NOTE — Progress Notes (Signed)
Patient ID: [name], female   DOB: 12/26/1940, 74 y.o.   MRN: [id number]  [facility]     Allergies  Allergen Reactions  . Demerol     Per MAR.    Marland Kitchen. Ampicillin     Per facility [facility].   Marland Kitchen. Aspirin     Per facility [facility].    Marland Kitchen. Codeine     Per MAR.    . Dilaudid [Hydromorphone Hcl]     Per MAR.   Marland Kitchen. Elavil [Amitriptyline]     Per MAR.   Marland Kitchen. Flexeril [Cyclobenzaprine Hcl]     Per [facility].    . [facility]     [facility]    . Other     [facility]    Marland Kitchen. Penicillins     Per [facility].         Chief Complaint  Patient presents with  . Acute Visit    medication review    HPI:  Since her psych medications have been stopped she has become more agitated. She is having more hallucinations and more anger present. She does not want to take the roxanol but wants her vicodin back in place. She is presently on an air mattress and she wants this gone as well. She is not able to fully participate in the ros.    Past Medical History  Diagnosis Date  . Hypertension   . Allergy   . Bipolar 1 disorder   . Dementia   . GERD (gastroesophageal reflux disease)   . Hyponatremia   . Schizoaffective disorder   . Cerebrovascular accident   . Anemia   . Thyroid disease     Past Surgical History  Procedure Laterality Date  . Appendectomy    . Fracture surgery      VITAL SIGNS BP 110/68 mmHg  Pulse 100  Ht 5\' 4"  (1.626 m)  Wt 144 lb (65.318 kg)  BMI 24.71 kg/m2   Outpatient Encounter Prescriptions as of 08/16/2014  Medication Sig  . morphine (ROXANOL) 20 MG/ML concentrated solution Take 5 mg by mouth every 3 (three) hours as needed for severe pain. Takes every 6 hours routinely as well     SIGNIFICANT DIAGNOSTIC EXAMS not a candidate for mammogram; dexa or colonoscopy  07-16-14: left forearm x-ray: no acute fracture  07-16-14: left humerus x-ray: no acute fracture    LABS REVIEWED:   01-31-14: wbc 6.6 ;hgb 12.9; hct 40.3; mcv 88.8; plt 297; glucose 118; bun  4.0; creat 0.49; k+3.5; na++134; liver normal albumin 2.9; tsh 10.90; depakote 59 02-02-14: urine culture: urine culture: e-coli: levaquin 03-09-14: tsh 11.06  04-25-14: wbc 9.0; hgb 10.3; hct 33.8; mcv95.4; plt 245; glucose 77; bun 15.4; creat 0.47; k+3.7; na++135; liver normal albumin 2.8; chol 129; ldl 77; trig 122  06-02-14: tsh 2.24  06-05-14: depakote 32     ROS Unable to perform ROS   Physical Exam Constitutional: No distress.  Neck: Neck supple. No JVD present. No thyromegaly present.  Cardiovascular: Normal rate, normal heart sounds and intact distal pulses.   Heart rate slightly irregular  Pedal pulses not palpable   Respiratory: Effort normal and breath sounds normal. No respiratory distress.  GI: Soft. Bowel sounds are normal. She exhibits no distension. There is no tenderness.  Musculoskeletal: She exhibits no edema.  Right upper extremity contracture especially in the wrist  Able to move left upper extremity Does not move lower extremities Has contracture in right lower extremity   Neurological: She  is alert.  Skin: Skin is warm and dry. She is not diaphoretic.  Right heel: stage III:  No signs of infection present      ASSESSMENT/ PLAN:  1. Chronic pain: will stop the roxanol at this time; will start vicodin 5/325 mg every 6 hours routinely and will monitor her pain relief; will remove her air mattress at this time.   2. Bipolar disorder: will restart her back on her depakote sprinkles at 125 mg nightly for 3 days then twice daily for 3 days then 250 mg twice daily as she failed coming off this medication and will monitor her status.      Synthia Innocenteborah Roen Macgowan NP [facility]  Contact [phone] Monday through Friday 8am- 5pm  After hours call [phone]

## 2014-09-19 NOTE — Progress Notes (Signed)
Patient ID: [name], female   DOB: 10/03/1940, 74 y.o.   MRN: [id number]  [facility]     Allergies  Allergen Reactions  . Demerol     Per [facility].    Marland Kitchen. Ampicillin     Per facility [facility].   Marland Kitchen. Aspirin     Per facility [facility].    Marland Kitchen. Codeine     Per [facility].    . Dilaudid [Hydromorphone Hcl]     Per MAR.   Marland Kitchen. Elavil [Amitriptyline]     Per MAR.   Marland Kitchen. Flexeril [Cyclobenzaprine Hcl]     Per [facility].    . [facility]     [facility]    . Other     [facility]    Marland Kitchen. Penicillins     Per [facility].         Chief Complaint  Patient presents with  . Acute Visit    patient status     HPI:  She continues to have issues with agitation; yelling out; cussing at those around her; excessive demands with agitation. The staff is unable to redirect her and have great difficulty trying to meet her needs. The nursing staff feels as though she needs further medication in order to reduce her symptoms and to allow her to engage in those around her.    Past Medical History  Diagnosis Date  . Hypertension   . Allergy   . Bipolar 1 disorder   . Dementia   . GERD (gastroesophageal reflux disease)   . Hyponatremia   . Schizoaffective disorder   . Cerebrovascular accident   . Anemia   . Thyroid disease     Past Surgical History  Procedure Laterality Date  . Appendectomy    . Fracture surgery      VITAL SIGNS BP 100/64 mmHg  Pulse 68  Ht 5\' 4"  (1.626 m)  Wt 145 lb (65.772 kg)  BMI 24.88 kg/m2   Outpatient Encounter Prescriptions as of 09/06/2014  depakote 125 mg sprinkles  Take 250 mg twice daily   vicodin 5/325 mg  Take 1 tab every 6 hours       SIGNIFICANT DIAGNOSTIC EXAMS not a candidate for mammogram; dexa or colonoscopy  07-16-14: left forearm x-ray: no acute fracture  07-16-14: left humerus x-ray: no acute fracture    LABS REVIEWED:   01-31-14: wbc 6.6 ;hgb 12.9; hct 40.3; mcv 88.8; plt 297; glucose 118; bun 4.0; creat 0.49; k+3.5; na++134; liver normal  albumin 2.9; tsh 10.90; depakote 59 02-02-14: urine culture: urine culture: e-coli: levaquin 03-09-14: tsh 11.06  04-25-14: wbc 9.0; hgb 10.3; hct 33.8; mcv95.4; plt 245; glucose 77; bun 15.4; creat 0.47; k+3.7; na++135; liver normal albumin 2.8; chol 129; ldl 77; trig 122  06-02-14: tsh 2.24  06-05-14: depakote 32    ROS Unable to perform ROS    Physical Exam Constitutional: No distress.  Neck: Neck supple. No JVD present. No thyromegaly present.  Cardiovascular: Normal rate, normal heart sounds and intact distal pulses.   Heart rate slightly irregular  Pedal pulses not palpable   Respiratory: Effort normal and breath sounds normal. No respiratory distress.  GI: Soft. Bowel sounds are normal. She exhibits no distension. There is no tenderness.  Musculoskeletal: She exhibits no edema.  Right upper extremity contracture especially in the wrist  Able to move left upper extremity Does not move lower extremities Has contracture in right lower extremity   Neurological: She is alert.  Skin: Skin is  warm and dry. She is not diaphoretic.  Right heel: stage III:  No signs of infection present     ASSESSMENT/ PLAN:  1. Bipolar disorder: will begin her risperdal 0.25 mg nightly. Will continue her depakote sprinkles 250  Twice daily and will monitor her status.     Synthia Innocenteborah Lenna Hagarty NP [facility]  Contact [phone] Monday through Friday 8am- 5pm  After hours call [phone]

## 2014-10-01 ENCOUNTER — Other Ambulatory Visit: Payer: Self-pay | Admitting: *Deleted

## 2014-10-01 MED ORDER — HYDROCODONE-ACETAMINOPHEN 5-325 MG PO TABS: ORAL_TABLET | ORAL | Status: DC

## 2014-10-01 NOTE — Telephone Encounter (Signed)
[name] 

## 2014-10-22 ENCOUNTER — Other Ambulatory Visit: Payer: Self-pay | Admitting: *Deleted

## 2014-10-22 MED ORDER — HYDROCODONE-ACETAMINOPHEN 5-325 MG PO TABS: ORAL_TABLET | ORAL | Status: DC

## 2014-10-22 NOTE — Telephone Encounter (Signed)
[name] 

## 2014-10-25 ENCOUNTER — Non-Acute Institutional Stay (SKILLED_NURSING_FACILITY): Payer: Medicare Other | Admitting: Adult Health

## 2014-10-25 DIAGNOSIS — F0390 Unspecified dementia without behavioral disturbance: Secondary | ICD-10-CM | POA: Diagnosis not present

## 2014-10-25 DIAGNOSIS — I69354 Hemiplegia and hemiparesis following cerebral infarction affecting left non-dominant side: Secondary | ICD-10-CM

## 2014-10-25 DIAGNOSIS — G8929 Other chronic pain: Secondary | ICD-10-CM | POA: Diagnosis not present

## 2014-10-25 DIAGNOSIS — R634 Abnormal weight loss: Secondary | ICD-10-CM | POA: Diagnosis not present

## 2014-10-25 DIAGNOSIS — I639 Cerebral infarction, unspecified: Secondary | ICD-10-CM | POA: Diagnosis not present

## 2014-10-25 DIAGNOSIS — F319 Bipolar disorder, unspecified: Secondary | ICD-10-CM | POA: Diagnosis not present

## 2014-10-25 DIAGNOSIS — R627 Adult failure to thrive: Secondary | ICD-10-CM | POA: Diagnosis not present

## 2014-10-25 DIAGNOSIS — I69854 Hemiplegia and hemiparesis following other cerebrovascular disease affecting left non-dominant side: Secondary | ICD-10-CM

## 2014-11-16 LAB — CBC AND DIFFERENTIAL
HEMATOCRIT: 40 % (ref 36–46)
Hemoglobin: 11.9 g/dL — AB (ref 12.0–16.0)
PLATELETS: 316 10*3/uL (ref 150–399)
WBC: 8.9 10^3/mL

## 2014-11-16 LAB — BASIC METABOLIC PANEL
BUN: 4 mg/dL (ref 4–21)
CREATININE: 0.3 mg/dL — AB (ref 0.5–1.1)
POTASSIUM: 3.2 mmol/L — AB (ref 3.4–5.3)
Sodium: 134 mmol/L — AB (ref 137–147)

## 2014-11-22 ENCOUNTER — Non-Acute Institutional Stay (SKILLED_NURSING_FACILITY): Payer: Medicare Other | Admitting: Adult Health

## 2014-11-22 DIAGNOSIS — I639 Cerebral infarction, unspecified: Secondary | ICD-10-CM | POA: Diagnosis not present

## 2014-11-22 DIAGNOSIS — R627 Adult failure to thrive: Secondary | ICD-10-CM

## 2014-11-22 DIAGNOSIS — G8929 Other chronic pain: Secondary | ICD-10-CM | POA: Diagnosis not present

## 2014-11-22 DIAGNOSIS — I69354 Hemiplegia and hemiparesis following cerebral infarction affecting left non-dominant side: Secondary | ICD-10-CM

## 2014-11-22 DIAGNOSIS — I69854 Hemiplegia and hemiparesis following other cerebrovascular disease affecting left non-dominant side: Secondary | ICD-10-CM | POA: Diagnosis not present

## 2014-11-22 DIAGNOSIS — F319 Bipolar disorder, unspecified: Secondary | ICD-10-CM

## 2014-11-26 LAB — BASIC METABOLIC PANEL: Potassium: 3.6 mmol/L (ref 3.4–5.3)

## 2014-11-27 ENCOUNTER — Encounter: Payer: Self-pay | Admitting: Adult Health

## 2014-11-27 ENCOUNTER — Other Ambulatory Visit: Payer: Self-pay | Admitting: *Deleted

## 2014-11-27 MED ORDER — HYDROCODONE-ACETAMINOPHEN 5-325 MG PO TABS: ORAL_TABLET | ORAL | Status: DC

## 2014-11-27 NOTE — Telephone Encounter (Signed)
[name] 

## 2014-11-27 NOTE — Progress Notes (Signed)
Patient ID: [name], female   DOB: 11/22/1940, 74 y.o.   MRN: [id number]  [facility]     Allergies  Allergen Reactions  . Demerol     Per MAR.    Marland Kitchen. Ampicillin     Per facility [facility].   Marland Kitchen. Aspirin     Per facility [facility].    Marland Kitchen. Codeine     Per MAR.    . Dilaudid [Hydromorphone Hcl]     Per MAR.   Marland Kitchen. Elavil [Amitriptyline]     Per MAR.   Marland Kitchen. Flexeril [Cyclobenzaprine Hcl]     Per [facility].    . [facility]     [facility]    . Other     COX2 inhibitors. [facility]    Marland Kitchen. Penicillins     Per [facility].         Chief Complaint  Patient presents with  . Medical Management of Chronic Issues    HPI:  She is a long term resident of this facility being seen for the management of her chronic illnesses. She is followed by hospice care. Overall there is little change in her status. Her behaviors has somewhat stabilized since starting the depakote and risperdal. She is unable to fully participate in the hpi or ros. There are no nursing concerns at this time.    Past Medical History  Diagnosis Date  . Hypertension   . Allergy   . Bipolar 1 disorder   . Dementia   . GERD (gastroesophageal reflux disease)   . Hyponatremia   . Schizoaffective disorder   . Cerebrovascular accident   . Anemia   . Thyroid disease     Past Surgical History  Procedure Laterality Date  . Appendectomy    . Fracture surgery      VITAL SIGNS BP 122/60 mmHg  Pulse 86  Ht 5\' 4"  (1.626 m)  Wt 148 lb (67.132 kg)  BMI 25.39 kg/m2  SpO2 97%   Outpatient Encounter Prescriptions as of 10/25/2014  Medication Sig  . divalproex (DEPAKOTE SPRINKLE) 125 MG capsule Take 250 mg by mouth 2 (two) times daily. For anxiety disorder  . HYDROcodone-acetaminophen (NORCO/VICODIN) 5-325 MG per tablet Take one tablet by mouth every 6 hours for pain. Do not exceed 3000mg  of APAP from all sources/24 hours  . Polyethyl Glycol-Propyl Glycol 0.4-0.3 % SOLN Apply 1 drop to eye 2 (two) times daily. Both eyes  . risperiDONE  (RISPERDAL) 0.25 MG tablet Take 1 tablet (0.25 mg total) by mouth at bedtime.      SIGNIFICANT DIAGNOSTIC EXAMS  not a candidate for mammogram; dexa or colonoscopy  07-16-14: left forearm x-ray: no acute fracture  07-16-14: left humerus x-ray: no acute fracture    LABS REVIEWED:   01-31-14: wbc 6.6 ;hgb 12.9; hct 40.3; mcv 88.8; plt 297; glucose 118; bun 4.0; creat 0.49; k+3.5; na++134; liver normal albumin 2.9; tsh 10.90; depakote 59 02-02-14: urine culture: urine culture: e-coli: levaquin 03-09-14: tsh 11.06  04-25-14: wbc 9.0; hgb 10.3; hct 33.8; mcv95.4; plt 245; glucose 77; bun 15.4; creat 0.47; k+3.7; na++135; liver normal albumin 2.8; chol 129; ldl 77; trig 122  06-02-14: tsh 2.24  06-05-14: depakote 32      Review of Systems  Unable to perform ROS: dementia     Physical Exam Constitutional: No distress.  Neck: Neck supple. No JVD present. No thyromegaly present.  Cardiovascular: Normal rate, normal heart sounds and intact distal pulses.   Heart rate slightly irregular  Pedal  pulses not palpable   Respiratory: Effort normal and breath sounds normal. No respiratory distress.  GI: Soft. Bowel sounds are normal. She exhibits no distension. There is no tenderness.  Musculoskeletal: She exhibits no edema.  Right upper extremity contracture especially in the wrist  Able to move left upper extremity Does not move lower extremities Has contracture in right lower extremity   Neurological: She is alert.  Skin: Skin is warm and dry. She is not diaphoretic.      ASSESSMENT/ PLAN:  1. FTT/weight loss: will continue her supplements per the facility protocol. Her current weight is 148 pounds up from 144 pounds on 08-16-14. Will not make changes and will monitor her status.   2. Chronic pain: has contractures hsa splintr left wrist/hand. Will continue vicodin 5/325 mg every 6 hours routinely and will monitor   3. Bipolar disorder: will continue her depakote 250 mg twice daily  and risperdal 0.25 mg nightly and will monitor her status.   4. Dementia: is end stage; is presently not on medications; she is followed by hospice care; her weight is stable at 148 pounds; will continue to monitor her status.   5. Late effect cva: right hemiparesis:  is presently not on medications; has right hemiparesis with contractures present. Will monitor  Synthia Innocent NP [facility]  Contact [phone] Monday through Friday 8am- 5pm  After hours call [phone]

## 2014-12-20 ENCOUNTER — Non-Acute Institutional Stay (SKILLED_NURSING_FACILITY): Payer: Medicare Other | Admitting: Internal Medicine

## 2014-12-20 ENCOUNTER — Encounter: Payer: Self-pay | Admitting: Internal Medicine

## 2014-12-20 DIAGNOSIS — F0150 Vascular dementia without behavioral disturbance: Secondary | ICD-10-CM

## 2014-12-20 DIAGNOSIS — R627 Adult failure to thrive: Secondary | ICD-10-CM

## 2014-12-20 DIAGNOSIS — I69854 Hemiplegia and hemiparesis following other cerebrovascular disease affecting left non-dominant side: Secondary | ICD-10-CM | POA: Diagnosis not present

## 2014-12-20 DIAGNOSIS — E871 Hypo-osmolality and hyponatremia: Secondary | ICD-10-CM

## 2014-12-20 DIAGNOSIS — F319 Bipolar disorder, unspecified: Secondary | ICD-10-CM

## 2014-12-20 DIAGNOSIS — I69354 Hemiplegia and hemiparesis following cerebral infarction affecting left non-dominant side: Secondary | ICD-10-CM

## 2014-12-20 NOTE — Progress Notes (Signed)
Patient ID: [name], female   DOB: 07-09-1940, 74 y.o.   MRN: [id number]    DATE:12/20/14  Location:  [facility]    Place of Service: SNF [phone])   Extended Emergency Contact Information Primary Emergency Contact: Sandria Senter Address: [address]           [place], [place] [address] [place] of [place] Home Phone: [phone] Mobile Phone: [phone] Relation: Son Secondary Emergency Contact: Marilu Favre Address: [address]          [place], [place] [address] Home Phone: [phone] Relation: None  Advanced Directive information  DNR; MOST FORM ON CHART - NO FT; HOSPICE PT  Chief Complaint  Patient presents with  . Medical Management of Chronic Issues    HPI:  74 yo female long term resident seen today for f/u. She has no c/o.no significant changes in her condition. She has end stage dementia and is a poor historian. Hx obtained from chart. No falls. No nursing issues. Appetite is poor. No issues with sleeping. She is a hospice pt  Mood is stable on depakote and risperdal. She does not take any meds for dementia.  Pain is controlled with scheduled norco  She has nutritional supplement TID as well as vitamins/minerals.  Past Medical History  Diagnosis Date  . Hypertension   . Allergy   . Bipolar 1 disorder   . Dementia   . GERD (gastroesophageal reflux disease)   . Hyponatremia   . Schizoaffective disorder   . Cerebrovascular accident   . Anemia   . Thyroid disease     Past Surgical History  Procedure Laterality Date  . Appendectomy    . Fracture surgery      Patient Care Team: Kirt Boys, DO as PCP - General (Internal Medicine) Sharee Holster, NP as Nurse Practitioner (Nurse Practitioner)  History   Social History  . Marital Status: Married    Spouse Name: N/A  . Number of Children: N/A  . Years of Education: N/A   Occupational History  . Not on file.   Social History Main Topics  . Smoking status: Former Smoker  -- 1.00 packs/day    Types: Cigarettes  . Smokeless tobacco: Not on file  . Alcohol Use: No  . Drug Use: No  . Sexual Activity: Not on file   Other Topics Concern  . Not on file   Social History Narrative     reports that she has quit smoking. Her smoking use included Cigarettes. She smoked 1.00 pack per day. She does not have any smokeless tobacco history on file. She reports that she does not drink alcohol or use illicit drugs.  Immunization History  Administered Date(s) Administered  . Influenza Whole 02/27/2013  . Influenza-Unspecified 02/26/2014  . Pneumococcal-Unspecified 10/06/2011    Allergies  Allergen Reactions  . Demerol     Per MAR.    Marland Kitchen Ampicillin     Per facility [facility].   Marland Kitchen Aspirin     Per facility [facility].    Marland Kitchen Codeine     Per MAR.    . Dilaudid [Hydromorphone Hcl]     Per MAR.   Marland Kitchen Elavil [Amitriptyline]     Per MAR.   Marland Kitchen Flexeril [Cyclobenzaprine Hcl]     Per MAR.    . Nsaids     Per facility [facility].    . Other     COX2 inhibitors. Per facility [facility].    Marland Kitchen Penicillins  Per MAR.      Medications: Patient's Medications  New Prescriptions   No medications on file  Previous Medications   DIVALPROEX (DEPAKOTE SPRINKLE) 125 MG CAPSULE    Take 250 mg by mouth 2 (two) times daily. For anxiety disorder   HYDROCODONE-ACETAMINOPHEN (NORCO/VICODIN) 5-325 MG PER TABLET    Take one tablet by mouth every 6 hours for pain. Do not exceed  of APAP from all sources/24 hours   POLYETHYL GLYCOL-PROPYL GLYCOL 0.4-0.3 % SOLN    Apply 1 drop to eye 2 (two) times daily. Both eyes   RISPERIDONE (RISPERDAL) 0.25 MG TABLET    Take 1 tablet (0.25 mg total) by mouth at bedtime.  Modified Medications   No medications on file  Discontinued Medications   No medications on file    Review of Systems  Unable to perform ROS: Dementia    Filed Vitals:   12/20/14 1737  BP: 128/69  Pulse: 69  Temp: 97.4 F (36.3 C)  SpO2: 97%   There is no weight on file to calculate  BMI.  Physical Exam  Constitutional: She appears well-developed. She is cooperative.  Frail appearing in NAD, curled up and lying on side in bed  HENT:  Mouth/Throat: Oropharynx is clear and moist. No oropharyngeal exudate.  Eyes: Pupils are equal, round, and reactive to light. No scleral icterus.  Neck: Neck supple. No tracheal deviation present.  Cardiovascular: Normal rate, regular rhythm, normal heart sounds and intact distal pulses.  Exam reveals no gallop and no friction rub.   No murmur heard. No LE edema b/l. no calf TTP.   Pulmonary/Chest: Effort normal and breath sounds normal. No respiratory distress. She has no wheezes. She has no rales.  Abdominal: Soft. Bowel sounds are normal. She exhibits no distension and no mass. There is no tenderness. There is no rebound and no guarding.  Musculoskeletal: She exhibits edema and tenderness.  LUE contracture with brace intact  Lymphadenopathy:    She has no cervical adenopathy.  Neurological: She is alert.  Skin: Skin is warm and dry. No rash noted.  Psychiatric: She has a normal mood and affect. Her behavior is normal.     Labs reviewed: Nursing Home on 12/20/2014  Component Date Value Ref Range Status  . Hemoglobin 11/16/2014 11.9* 12.0 - 16.0 g/dL Final  . HCT 69/62/9528 40  36 - 46 % Final  . Platelets 11/16/2014 316  150 - 399 K/L Final  . WBC 11/16/2014 8.9   Final  . BUN 11/16/2014 4  4 - 21 mg/dL Final  . Creatinine 41/32/4401 0.3* 0.5 - 1.1 mg/dL Final  . Potassium 02/72/5366 3.2* 3.4 - 5.3 mmol/L Final  . Sodium 11/16/2014 134* 137 - 147 mmol/L Final  . Potassium 11/26/2014 3.6  3.4 - 5.3 mmol/L Final    No results found.   Assessment/Plan   ICD-9-CM ICD-10-CM   1. 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 Bipolar 1 disorder - stable 296.7 F31.9   3. FTT (failure to thrive) in adult - due to #1 783.7 R62.7   4. Hemiparesis affecting left side as late effect of stroke -  stable 438.20 I69.854   5. Hyponatremia - stable 276.1 E87.1     --Pt is stable on current tx plan. Continue current medications as ordered. PT/OT/ST as indicated. Will follow  Antonella Upson S. Ancil Linsey  [facility] [address] [place],  [place] [address] [phone] Cell (Monday-Friday 8 AM - 5 PM) [phone] After 5 PM and follow prompts

## 2014-12-24 ENCOUNTER — Other Ambulatory Visit: Payer: Self-pay | Admitting: *Deleted

## 2014-12-24 MED ORDER — HYDROCODONE-ACETAMINOPHEN 5-325 MG PO TABS: ORAL_TABLET | ORAL | Status: DC

## 2014-12-24 NOTE — Telephone Encounter (Signed)
[name] 

## 2015-01-28 ENCOUNTER — Other Ambulatory Visit: Payer: Self-pay | Admitting: *Deleted

## 2015-01-28 MED ORDER — HYDROCODONE-ACETAMINOPHEN 5-325 MG PO TABS: ORAL_TABLET | ORAL | Status: DC

## 2015-01-28 NOTE — Telephone Encounter (Signed)
[name] 

## 2015-01-29 ENCOUNTER — Encounter: Payer: Self-pay | Admitting: Adult Health

## 2015-01-29 ENCOUNTER — Other Ambulatory Visit: Payer: Self-pay | Admitting: *Deleted

## 2015-01-29 MED ORDER — HYDROCODONE-ACETAMINOPHEN 5-325 MG PO TABS: ORAL_TABLET | ORAL | Status: DC

## 2015-01-29 NOTE — Progress Notes (Signed)
Patient ID: [name], female   DOB: Jan 08, 1941, 74 y.o.   MRN: [id number]    Facility: [facility]      Allergies  Allergen Reactions  . Demerol     Per MAR.    Marland Kitchen Ampicillin     Per facility [facility].   Marland Kitchen Aspirin     Per facility [facility].    Marland Kitchen Codeine     Per MAR.    . Dilaudid [Hydromorphone Hcl]     Per MAR.   Marland Kitchen Elavil [Amitriptyline]     Per MAR.   Marland Kitchen Flexeril [Cyclobenzaprine Hcl]     Per MAR.    . Nsaids     Per facility [facility].    . Other     COX2 inhibitors. Per facility [facility].    Marland Kitchen Penicillins     Per MAR.      Chief Complaint  Patient presents with  . Medical Management of Chronic Issues    HPI:  She is a long term resident of this facility being seen for the management of her chronic illnesses. She continues to be followed by hospice care. There is no significant change in her status. She is unable to fully participate in the hpi or ros; but states that she is not in pain. There are no nursing concerns at this time.    Past Medical History  Diagnosis Date  . Hypertension   . Allergy   . Bipolar 1 disorder   . Dementia   . GERD (gastroesophageal reflux disease)   . Hyponatremia   . Schizoaffective disorder   . Cerebrovascular accident   . Anemia   . Thyroid disease     Past Surgical History  Procedure Laterality Date  . Appendectomy    . Fracture surgery      VITAL SIGNS BP 121/74 mmHg  Pulse 63  Ht  (1.626 m)  Wt 147 lb (66.679 kg)  BMI 25.22 kg/m2  SpO2 96%  Patient's Medications  New Prescriptions   No medications on file  Previous Medications   AMINO ACIDS-PROTEIN HYDROLYS (FEEDING SUPPLEMENT, PRO-STAT SUGAR FREE 64,) LIQD    Take 30 mLs by mouth 2 (two) times daily.   DIVALPROEX (DEPAKOTE SPRINKLE) 125 MG CAPSULE    Take 250 mg by mouth 2 (two) times daily. For anxiety disorder   HYDROCODONE-ACETAMINOPHEN (NORCO/VICODIN) 5-325 MG PER TABLET    Take one tablet by mouth every 6 hours for pain. Not to exceed  of APAP  from all sources /24hr   POLYETHYL GLYCOL-PROPYL GLYCOL 0.4-0.3 % SOLN    Apply 1 drop to eye 2 (two) times daily. Both eyes   RISPERIDONE (RISPERDAL) 0.25 MG TABLET    Take 1 tablet (0.25 mg total) by mouth at bedtime.  Modified Medications   No medications on file  Discontinued Medications   No medications on file     SIGNIFICANT DIAGNOSTIC EXAMS   not a candidate for mammogram; dexa or colonoscopy  07-16-14: left forearm x-ray: no acute fracture  07-16-14: left humerus x-ray: no acute fracture    LABS REVIEWED:   01-31-14: wbc 6.6 ;hgb 12.9; hct 40.3; mcv 88.8; plt 297; glucose 118; bun 4.0; creat 0.49; k+3.5; na++134; liver normal albumin 2.9; tsh 10.90; depakote 59 02-02-14: urine culture: urine culture: e-coli: levaquin 03-09-14: tsh 11.06  04-25-14: wbc 9.0; hgb 10.3; hct 33.8; mcv95.4; plt 245; glucose 77; bun 15.4; creat 0.47; k+3.7; na++135; liver normal albumin 2.8; chol 129; ldl 77; trig 122  06-02-14: tsh 2.24  06-05-14: depakote 32  11-16-14: wbc 8.4; hgb 11.9; hct 39.6; mcv 86.3; plt 316; glucose 94; bun 36; creat 0.3; k+3.2; na++134; liver normal albumin 2.9        Review of Systems Unable to perform ROS: dementia    Physical Exam Constitutional: No distress.  Neck: Neck supple. No JVD present. No thyromegaly present.  Cardiovascular: Normal rate, normal heart sounds and intact distal pulses.   Heart rate slightly irregular  Pedal pulses not palpable   Respiratory: Effort normal and breath sounds normal. No respiratory distress.  GI: Soft. Bowel sounds are normal. She exhibits no distension. There is no tenderness.  Musculoskeletal: She exhibits no edema.  Right upper extremity contracture especially in the wrist  Able to move left upper extremity Does not move lower extremities Has contracture in right lower extremity   Neurological: She is alert.  Skin: Skin is warm and dry. She is not diaphoretic. Right lateral foot: 0.7 x 0.7 cm deep tissue  injury Left lateral foot: 2.7 x 1.1 cm deep tissue injury       ASSESSMENT/ PLAN:  1. FTT/weight loss: will continue her supplements per the facility protocol. Her current weight is 147 pounds up from 144 pounds on 08-16-14. Will not make changes and will monitor her status.   2. Chronic pain: has contractures has splint right  wrist/hand. Will continue vicodin 5/325 mg every 6 hours routinely and will monitor   3. Bipolar disorder: will continue her depakote 250 mg twice daily and risperdal 0.25 mg nightly and will monitor her status.   4. Dementia: is end stage; is presently not on medications; she is followed by hospice care; her weight is stable at 147 pounds with a one pound weight loss; will continue to monitor her status.   5. Late effect cva: right hemiparesis:  is presently not on medications; has right hemiparesis with contractures present. Does have a splint present  Will monitor  6. Hypokalemia: will begin k+ 20 meq every 4 hours X2 will then repeat k+ level       Synthia Innocent NP [facility]  Contact [phone] Monday through Friday 8am- 5pm  After hours call [phone]

## 2015-01-29 NOTE — Telephone Encounter (Signed)
Per Herbert Seta at [facility] Fax:[phone]

## 2015-01-30 ENCOUNTER — Non-Acute Institutional Stay (SKILLED_NURSING_FACILITY): Payer: Medicare Other | Admitting: Adult Health

## 2015-01-30 DIAGNOSIS — F319 Bipolar disorder, unspecified: Secondary | ICD-10-CM | POA: Diagnosis not present

## 2015-01-30 DIAGNOSIS — I69354 Hemiplegia and hemiparesis following cerebral infarction affecting left non-dominant side: Secondary | ICD-10-CM

## 2015-01-30 DIAGNOSIS — I638 Other cerebral infarction: Secondary | ICD-10-CM

## 2015-01-30 DIAGNOSIS — F0390 Unspecified dementia without behavioral disturbance: Secondary | ICD-10-CM

## 2015-01-30 DIAGNOSIS — I6389 Other cerebral infarction: Secondary | ICD-10-CM

## 2015-01-30 DIAGNOSIS — R627 Adult failure to thrive: Secondary | ICD-10-CM | POA: Diagnosis not present

## 2015-01-30 DIAGNOSIS — G8929 Other chronic pain: Secondary | ICD-10-CM

## 2015-01-30 DIAGNOSIS — I69854 Hemiplegia and hemiparesis following other cerebrovascular disease affecting left non-dominant side: Secondary | ICD-10-CM | POA: Diagnosis not present

## 2015-03-06 ENCOUNTER — Encounter: Payer: Self-pay | Admitting: Adult Health

## 2015-03-06 NOTE — Progress Notes (Signed)
Patient ID: [name], female   DOB: 1940-08-19, 74 y.o.   MRN: [id number]   Facility: [facility]      Allergies  Allergen Reactions  . Demerol     Per MAR.    Marland Kitchen Ampicillin     Per facility [facility].   Marland Kitchen Aspirin     Per facility [facility].    Marland Kitchen Codeine     Per MAR.    . Dilaudid [Hydromorphone Hcl]     Per MAR.   Marland Kitchen Elavil [Amitriptyline]     Per MAR.   Marland Kitchen Flexeril [Cyclobenzaprine Hcl]     Per MAR.    . Nsaids     Per facility [facility].    . Other     COX2 inhibitors. Per facility [facility].    Marland Kitchen Penicillins     Per MAR.      Chief Complaint  Patient presents with  . Medical Management of Chronic Issues    HPI:  She is a long term resident of this facility being seen for the management of her chronic illnesses. She continues to be followed by hospice care. She is losing weight with her current weight of 124 pounds. She is unable to fully participate in the hpi or ros. There are no nursing concerns at this time.     Past Medical History  Diagnosis Date  . Hypertension   . Allergy   . Bipolar 1 disorder (HCC)   . Dementia   . GERD (gastroesophageal reflux disease)   . Hyponatremia   . Schizoaffective disorder   . Cerebrovascular accident (HCC)   . Anemia   . Thyroid disease     Past Surgical History  Procedure Laterality Date  . Appendectomy    . Fracture surgery      VITAL SIGNS BP 129/70 mmHg  Pulse 68  Ht  (1.626 m)  Wt 124 lb (56.246 kg)  BMI 21.27 kg/m2  Patient's Medications  New Prescriptions   No medications on file  Previous Medications   DIVALPROEX (DEPAKOTE SPRINKLE) 125 MG CAPSULE    Take 250 mg by mouth 2 (two) times daily. For anxiety disorder   HYDROCODONE-ACETAMINOPHEN (NORCO/VICODIN) 5-325 MG PER TABLET    Take one tablet by mouth every 6 hours for pain. Not to exceed  of APAP from all sources /24hr   MULTIPLE VITAMINS-MINERALS (DECUBI-VITE PO)    Take 2 capsules by mouth daily.   NUTRITIONAL SUPPLEMENTS (NUTRITIONAL  SUPPLEMENT PO)    Take 90 mLs by mouth 3 (three) times daily. 2-cal   POLYETHYL GLYCOL-PROPYL GLYCOL 0.4-0.3 % SOLN    Apply 1 drop to eye 2 (two) times daily. Both eyes   RISPERIDONE (RISPERDAL) 0.25 MG TABLET    Take 1 tablet (0.25 mg total) by mouth at bedtime.  Modified Medications   No medications on file  Discontinued Medications   AMINO ACIDS-PROTEIN HYDROLYS (FEEDING SUPPLEMENT, PRO-STAT SUGAR FREE 64,) LIQD    Take 30 mLs by mouth 2 (two) times daily.     SIGNIFICANT DIAGNOSTIC EXAMS   not a candidate for mammogram; dexa or colonoscopy  07-16-14: left forearm x-ray: no acute fracture  07-16-14: left humerus x-ray: no acute fracture    LABS REVIEWED:   01-31-14: wbc 6.6 ;hgb 12.9; hct 40.3; mcv 88.8; plt 297; glucose 118; bun 4.0; creat 0.49; k+3.5; na++134; liver normal albumin 2.9; tsh 10.90; depakote 59 02-02-14: urine culture: urine culture: e-coli: levaquin 03-09-14: tsh 11.06  04-25-14: wbc 9.0; hgb 10.3;  hct 33.8; mcv95.4; plt 245; glucose 77; bun 15.4; creat 0.47; k+3.7; na++135; liver normal albumin 2.8; chol 129; ldl 77; trig 122  06-02-14: tsh 2.24  06-05-14: depakote 32  11-16-14: wbc 8.4; hgb 11.9; hct 39.6; mcv 86.3; plt 316; glucose 94; bun 36; creat 0.3; k+3.2; na++134; liver normal albumin 2.9      Review of Systems Unable to perform ROS: dementia     Physical Exam Constitutional: No distress.  Neck: Neck supple. No JVD present. No thyromegaly present.  Cardiovascular: Normal rate, normal heart sounds and intact distal pulses.   Heart rate slightly irregular  Pedal pulses not palpable   Respiratory: Effort normal and breath sounds normal. No respiratory distress.  GI: Soft. Bowel sounds are normal. She exhibits no distension. There is no tenderness.  Musculoskeletal: She exhibits no edema.  Right upper extremity contracture especially in the wrist  Able to move left upper extremity Does not move lower extremities Has contracture in right lower  extremity   Neurological: She is alert.  Skin: Skin is warm and dry. She is not diaphoretic.       ASSESSMENT/ PLAN:  1. FTT/weight loss: will continue her supplements per the facility protocol. Her current weight is 124 pounds with  144 pounds on 08-16-14. Will not make changes and will monitor her status.   2. Chronic pain: has contractures has splint right  wrist/hand. Will continue vicodin 5/325 mg every 6 hours routinely and will monitor   3. Bipolar disorder: will continue her depakote 250 mg twice daily and risperdal 0.25 mg nightly and will monitor her status.   4. Dementia: is end stage; is presently not on medications; she is followed by hospice care; her weight is stable at 124 pounds; will continue to monitor her status.   5. Late effect cva: right hemiparesis:  is presently not on medications; has right hemiparesis with contractures present. Does have a splint present  Will monitor    Synthia Innocenteborah Moriah Loughry NP [facility]  Contact [phone] Monday through Friday 8am- 5pm  After hours call [phone]

## 2015-03-14 ENCOUNTER — Non-Acute Institutional Stay (SKILLED_NURSING_FACILITY): Payer: Medicare Other | Admitting: Internal Medicine

## 2015-03-14 ENCOUNTER — Encounter: Payer: Self-pay | Admitting: Internal Medicine

## 2015-03-14 DIAGNOSIS — I69354 Hemiplegia and hemiparesis following cerebral infarction affecting left non-dominant side: Secondary | ICD-10-CM | POA: Diagnosis not present

## 2015-03-14 DIAGNOSIS — F250 Schizoaffective disorder, bipolar type: Secondary | ICD-10-CM | POA: Diagnosis not present

## 2015-03-14 DIAGNOSIS — F0390 Unspecified dementia without behavioral disturbance: Secondary | ICD-10-CM | POA: Diagnosis not present

## 2015-03-14 DIAGNOSIS — F29 Unspecified psychosis not due to a substance or known physiological condition: Secondary | ICD-10-CM | POA: Insufficient documentation

## 2015-03-14 DIAGNOSIS — D638 Anemia in other chronic diseases classified elsewhere: Secondary | ICD-10-CM

## 2015-03-14 DIAGNOSIS — F319 Bipolar disorder, unspecified: Secondary | ICD-10-CM

## 2015-03-14 DIAGNOSIS — R627 Adult failure to thrive: Secondary | ICD-10-CM | POA: Diagnosis not present

## 2015-03-14 HISTORY — DX: Anemia in other chronic diseases classified elsewhere: D63.8

## 2015-03-14 NOTE — Assessment & Plan Note (Signed)
Most recent Hb from 11/2014 was 11.9, up from prior; is on no supplements and minimal blood draws 2/2 Hospice; will monitor clinically

## 2015-03-14 NOTE — Assessment & Plan Note (Signed)
Chronic and stable;pt is on depakote for mood and risperdol for psychosis; cont these meds

## 2015-03-14 NOTE — Assessment & Plan Note (Signed)
Chronic and stable; cont risperdal

## 2015-03-14 NOTE — Assessment & Plan Note (Signed)
Reported pt is eating better now that she is served on paper plates;pt does no appear in distress; con to monitor

## 2015-03-14 NOTE — Assessment & Plan Note (Signed)
Pt is Hospice for this dx; she is no longer on namenda

## 2015-03-14 NOTE — Assessment & Plan Note (Signed)
Chronic and stable;pt can use R; cont to monitor

## 2015-03-14 NOTE — Progress Notes (Signed)
MRN: [id number] Name: Maryann AlarFannie L Dain  Sex: female Age: 74 y.o. DOB: 10/27/1940  [facility] Facility/Room:228 Level Of Care: SNF Provider: Merrilee SeashoreALEXANDER, Bradan Congrove D Emergency Contacts: Extended Emergency Contact Information Primary Emergency Contact: Sandria SenterBrewer,James & Lisa Address: [address]           [place], [place] [address] [place] Home Phone: [phone] Mobile Phone: [phone] Relation: Son Secondary Emergency Contact: Verbeke,RAYMOND Address: [address]          [place], [place] [address] Home Phone: [phone] Relation: None  Code Status:   Allergies: Demerol; Ampicillin; Aspirin; Codeine; Dilaudid; Elavil; Flexeril; Nsaids; Other; and Penicillins  Chief Complaint  Patient presents with  . Medical Management of Chronic Issues    HPI: Patient is 74 y.o. female who is Hospice with FTT and dementia, bipolar dx, s/p CVA and anemia who is being seen for these routine issues.  Past Medical History  Diagnosis Date  . Hypertension   . Allergy   . Bipolar 1 disorder (HCC)   . Dementia   . GERD (gastroesophageal reflux disease)   . Hyponatremia   . Schizoaffective disorder   . Cerebrovascular accident (HCC)   . Anemia   . Thyroid disease     Past Surgical History  Procedure Laterality Date  . Appendectomy    . Fracture surgery        Medication List       This list is accurate as of: 03/14/15  2:30 PM.  Always use your most recent med list.               DECUBI-VITE PO  Take 2 capsules by mouth daily.     divalproex 125 MG capsule  Commonly known as:  DEPAKOTE SPRINKLE  Take 250 mg by mouth 2 (two) times daily. For anxiety disorder     HYDROcodone-acetaminophen 5-325 MG tablet  Commonly known as:  NORCO/VICODIN  Take one tablet by mouth every 6 hours for pain. Not to exceed 3000mg  of APAP from all sources /24hr     NUTRITIONAL SUPPLEMENT PO  Take 90 mLs by mouth 3 (three) times daily. 2-cal     Polyethyl Glycol-Propyl Glycol 0.4-0.3 % Soln   Apply 1 drop to eye 2 (two) times daily. Both eyes     risperiDONE 0.25 MG tablet  Commonly known as:  RISPERDAL  Take 1 tablet (0.25 mg total) by mouth at bedtime.        No orders of the defined types were placed in this encounter.    Immunization History  Administered Date(s) Administered  . Influenza Whole 02/27/2013  . Influenza-Unspecified 02/26/2014  . Pneumococcal-Unspecified 10/06/2011    Social History  Substance Use Topics  . Smoking status: Former Smoker -- 1.00 packs/day    Types: Cigarettes  . Smokeless tobacco: Not on file  . Alcohol Use: No    Review of Systems  DATA OBTAINED: from patient, not reliable but only c/o she wanted a towel;nursing without concerns GENERAL:  no fevers, fatigue, appetite slt improved SKIN: No itching, rash HEENT: No complaint RESPIRATORY: No cough, wheezing, SOB CARDIAC: No chest pain, palpitations, lower extremity edema  GI: No abdominal pain, No N/V/D or constipation, No heartburn or reflux  GU: No dysuria, frequency or urgency, or incontinence  MUSCULOSKELETAL: No unrelieved bone/joint pain NEUROLOGIC: No headache, dizziness  PSYCHIATRIC: No overt anxiety or sadness  Filed Vitals:   03/14/15 1410  BP: 149/78  Pulse: 73  Temp: 97 F (36.1 C)  Resp:  20    Physical Exam  GENERAL APPEARANCE: Alert, conversant, No acute distress, WF in bed wearing her sunglasses SKIN: No diaphoresis rash HEENT: Unremarkable RESPIRATORY: Breathing is even, unlabored. Lung sounds are clear   CARDIOVASCULAR: Heart RRR no murmurs, rubs or gallops. No peripheral edema  GASTROINTESTINAL: Abdomen is soft, non-tender, not distended w/ normal bowel sounds.  GENITOURINARY: Bladder non tender, not distended  MUSCULOSKELETAL: LUE weekness and contracture NEUROLOGIC: Cranial nerves 2-12 grossly intact; L side weakness PSYCHIATRIC: odd, dementia, no behavioral issues  Patient Active Problem List   Diagnosis Date Noted  . Anemia of chronic  disease 03/14/2015  . Psychosis 03/14/2015  . Loss of weight 04/24/2014  . FTT (failure to thrive) in adult 04/24/2014  . Chronic pain 04/24/2014  . Hemiparesis affecting left side as late effect of stroke (HCC) 04/24/2014  . PVD (peripheral vascular disease) (HCC) 03/20/2014  . 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 Depression 02/17/2013  . Hypertension   . Dementia without behavioral disturbance   . Bipolar 1 disorder (HCC)   . Cerebrovascular accident (HCC)   . Hypothyroidism 09/23/2012  . Dyslipidemia 09/23/2012    CBC    Component Value Date/Time   WBC 8.9 11/16/2014   WBC 7.8 07/24/2013 1651   RBC 4.22 07/24/2013 1651   HGB 11.9* 11/16/2014   HCT 40 11/16/2014   PLT 316 11/16/2014   MCV 85.5 07/24/2013 1651   LYMPHSABS 1.6 09/26/2010 0522   MONOABS 1.4* 09/26/2010 0522   EOSABS 0.1 09/26/2010 0522   BASOSABS 0.0 09/26/2010 0522    CMP     Component Value Date/Time   NA 134* 11/16/2014   NA 128* 07/24/2013 1651   K 3.6 11/26/2014   CL 89* 07/24/2013 1651   CO2 27 07/24/2013 1651   GLUCOSE 119* 07/24/2013 1651   BUN 4 11/16/2014   BUN 7 07/24/2013 1651   CREATININE 0.3* 11/16/2014   CREATININE 0.78 07/24/2013 1651   CALCIUM 9.5 07/24/2013 1651   PROT 7.0 07/24/2013 1651   ALBUMIN 3.2* 07/24/2013 1651   AST 18 07/24/2013 1651   ALT 9 07/24/2013 1651   ALKPHOS 86 07/24/2013 1651   BILITOT 0.3 07/24/2013 1651   GFRNONAA 82* 07/24/2013 1651   GFRAA >90 07/24/2013 1651    Assessment and Plan  Dementia without behavioral disturbance Pt is Hospice for this dx; she is no longer on namenda  FTT (failure to thrive) in adult Reported pt is eating better now that she is served on paper plates;pt does no appear in distress; con to monitor  Bipolar 1 disorder Chronic and stable;pt is on depakote for mood and risperdol for psychosis; cont these meds  Hemiparesis affecting left side as late effect of stroke Chronic and stable;pt can use R;  cont to monitor  Anemia of chronic disease Most recent Hb from 11/2014 was 11.9, up from prior; is on no supplements and minimal blood draws 2/2 Hospice; will monitor clinically  Psychosis Chronic and stable; cont risperdal    Margit Hanks, MD    \

## 2015-04-17 ENCOUNTER — Encounter: Payer: Self-pay | Admitting: Adult Health

## 2015-04-17 ENCOUNTER — Non-Acute Institutional Stay (SKILLED_NURSING_FACILITY): Payer: Medicare Other | Admitting: Adult Health

## 2015-04-17 DIAGNOSIS — F319 Bipolar disorder, unspecified: Secondary | ICD-10-CM

## 2015-04-17 DIAGNOSIS — I69354 Hemiplegia and hemiparesis following cerebral infarction affecting left non-dominant side: Secondary | ICD-10-CM

## 2015-04-17 DIAGNOSIS — R627 Adult failure to thrive: Secondary | ICD-10-CM

## 2015-04-17 DIAGNOSIS — G8929 Other chronic pain: Secondary | ICD-10-CM

## 2015-04-17 DIAGNOSIS — F0390 Unspecified dementia without behavioral disturbance: Secondary | ICD-10-CM | POA: Diagnosis not present

## 2015-04-17 DIAGNOSIS — I638 Other cerebral infarction: Secondary | ICD-10-CM

## 2015-04-17 DIAGNOSIS — I6389 Other cerebral infarction: Secondary | ICD-10-CM

## 2015-04-17 NOTE — Progress Notes (Signed)
Patient ID: [name], female   DOB: 03/16/1941, 74 y.o.   MRN: [id number]    Facility:  [facility]      Allergies  Allergen Reactions  . Demerol     Per MAR.    Marland Kitchen. Ampicillin     Per facility [facility].   Marland Kitchen. Aspirin     Per facility [facility].    Marland Kitchen. Codeine     Per MAR.    . Dilaudid [Hydromorphone Hcl]     Per MAR.   Marland Kitchen. Elavil [Amitriptyline]     Per MAR.   Marland Kitchen. Flexeril [Cyclobenzaprine Hcl]     Per MAR.    . [facility]saids     [facility]    . Other     [facility]. [facility]    Marland Kitchen. Penicillins     Per MAR.      Chief Complaint  Patient presents with  . Medical Management of Chronic Issues    HPI:  She is a long term resident of this facility being seen for the management of her chronic illnesses. She is followed by hospice care. She continues to decline. She is losing weight from May weight of 148 pounds to her current weight of 108 pounds.  Her po intake remains poor. She is unable to participate in the hpi or ros. There are no nursing concerns at this time.    Past Medical History  Diagnosis Date  . Hypertension   . Allergy   . Bipolar 1 disorder (HCC)   . Dementia   . GERD (gastroesophageal reflux disease)   . Hyponatremia   . Schizoaffective disorder   . Cerebrovascular accident (HCC)   . Anemia   . Thyroid disease     Past Surgical History  Procedure Laterality Date  . Appendectomy    . Fracture surgery      VITAL SIGNS BP 135/72 mmHg  Pulse 82  Ht 5\' 4"  (1.626 m)  Wt 108 lb (48.988 kg)  BMI 18.53 kg/m2  SpO2 96%  Patient's Medications  New Prescriptions   No medications on file  Previous Medications   DIVALPROEX (DEPAKOTE SPRINKLE) 125 MG CAPSULE    Take 250 mg by mouth 2 (two) times daily. For anxiety disorder   HYDROCODONE-ACETAMINOPHEN (NORCO/VICODIN) 5-325 MG PER TABLET    Take one tablet by mouth every 6 hours for pain. Not to exceed 3000mg  of APAP from all sources /24hr   MULTIPLE VITAMINS-MINERALS (DECUBI-VITE PO)    Take 2  capsules by mouth daily.   NUTRITIONAL SUPPLEMENTS (NUTRITIONAL SUPPLEMENT PO)    Take 90 mLs by mouth 3 (three) times daily. 2-cal   POLYETHYL GLYCOL-PROPYL GLYCOL 0.4-0.3 % SOLN    Apply 1 drop to eye 2 (two) times daily. Both eyes   RISPERIDONE (RISPERDAL) 0.25 MG TABLET    Take 1 tablet (0.25 mg total) by mouth at bedtime.  Modified Medications   No medications on file  Discontinued Medications   No medications on file     SIGNIFICANT DIAGNOSTIC EXAMS  not a candidate for mammogram; dexa or colonoscopy  07-16-14: left forearm x-ray: no acute fracture  07-16-14: left humerus x-ray: no acute fracture    LABS REVIEWED:   01-31-14: wbc 6.6 ;hgb 12.9; hct 40.3; mcv 88.8; plt 297; glucose 118; bun 4.0; creat 0.49; k+3.5; na++134; liver normal albumin 2.9; tsh 10.90; depakote 59 02-02-14: urine culture: urine culture: e-coli: levaquin 03-09-14: tsh 11.06  04-25-14: wbc 9.0; hgb 10.3; hct 33.8; mcv95.4; plt  245; glucose 77; bun 15.4; creat 0.47; k+3.7; na++135; liver normal albumin 2.8; chol 129; ldl 77; trig 122  06-02-14: tsh 2.24  06-05-14: depakote 32  11-16-14: wbc 8.4; hgb 11.9; hct 39.6; mcv 86.3; plt 316; glucose 94; bun 36; creat 0.3; k+3.2; na++134; liver normal albumin 2.9      Review of Systems Unable to perform ROS: dementia     Physical Exam Constitutional: No distress.  Neck: Neck supple. No JVD present. No thyromegaly present.  Cardiovascular: Normal rate, normal heart sounds and intact distal pulses.   Heart rate slightly irregular  Pedal pulses not palpable   Respiratory: Effort normal and breath sounds normal. No respiratory distress.  GI: Soft. Bowel sounds are normal. She exhibits no distension. There is no tenderness.  Musculoskeletal: She exhibits no edema.  Right upper extremity contracture especially in the wrist  Able to move left upper extremity Does not move lower extremities Has contracture in right lower extremity   Neurological: She is alert.    Skin: Skin is warm and dry. She is not diaphoretic.       ASSESSMENT/ PLAN:  1. FTT/weight loss: will continue her supplements per the facility protocol. Her current weight is 108 pounds with  148 pounds in may 2016. . Will not make changes and will monitor her status.   2. Chronic pain: has contractures on  right  wrist/hand. Will continue vicodin 5/325 mg every 6 hours routinely and will monitor there are no indications of pain present.   3. Bipolar disorder: will continue her depakote 250 mg twice daily and risperdal 0.25 mg nightly and will monitor her status.   4. Dementia: is end stage; is  not on medications; she is followed by hospice care; her weight is 108 pounds her status is end stage weight loss is an expected outcome at this stage of her disease process.   5. Late effect cva: right hemiparesis:  is presently not on medications; has right hemiparesis with contractures present. Does have a splint present  Will monitor         Synthia Innocent NP [facility]  Contact [phone] Monday through Friday 8am- 5pm  After hours call [phone]

## 2015-05-14 ENCOUNTER — Non-Acute Institutional Stay (SKILLED_NURSING_FACILITY): Payer: Medicare Other | Admitting: Internal Medicine

## 2015-05-14 DIAGNOSIS — F319 Bipolar disorder, unspecified: Secondary | ICD-10-CM

## 2015-05-14 DIAGNOSIS — I69354 Hemiplegia and hemiparesis following cerebral infarction affecting left non-dominant side: Secondary | ICD-10-CM

## 2015-05-14 DIAGNOSIS — F0390 Unspecified dementia without behavioral disturbance: Secondary | ICD-10-CM | POA: Diagnosis not present

## 2015-05-14 NOTE — Progress Notes (Signed)
MRN: [id number] Name: [name]  Sex: female Age: 74 y.o. DOB: 1941-03-04  [facility] Facility/Room:228 Level Of Care: SNF Provider: Merrilee Seashore D Emergency Contacts: Extended Emergency Contact Information Primary Emergency Contact: Sandria Senter Address: [address]           [place], [place] [address] [place] Home Phone: [phone] Mobile Phone: [phone] Relation: Son Secondary Emergency Contact: Dishman,RAYMOND Address: [address]          [place], [place] [address] Home Phone: [phone] Relation: None  Code Status:   Allergies: Demerol; Ampicillin; Aspirin; Codeine; Dilaudid; Elavil; Flexeril; Nsaids; Other; and Penicillins  Chief Complaint  Patient presents with  . Medical Management of Chronic Issues    HPI: Patient is 74 y.o. female who has endstage dementia who is being seen for routine issues of dementia, bipolar dx and s/p CVA with R hemiparesis.   Past Medical History  Diagnosis Date  . Hypertension   . Allergy   . Bipolar 1 disorder (HCC)   . Dementia   . GERD (gastroesophageal reflux disease)   . Hyponatremia   . Schizoaffective disorder   . Cerebrovascular accident (HCC)   . Anemia   . Thyroid disease     Past Surgical History  Procedure Laterality Date  . Appendectomy    . Fracture surgery        Medication List       This list is accurate as of: 05/14/15 11:59 PM.  Always use your most recent med list.               DECUBI-VITE PO  Take 2 capsules by mouth daily.     divalproex 125 MG capsule  Commonly known as:  DEPAKOTE SPRINKLE  Take 250 mg by mouth 2 (two) times daily. For anxiety disorder     HYDROcodone-acetaminophen 5-325 MG tablet  Commonly known as:  NORCO/VICODIN  Take one tablet by mouth every 6 hours for pain. Not to exceed  of APAP from all sources /24hr     NUTRITIONAL SUPPLEMENT PO  Take 90 mLs by mouth 3 (three) times daily. 2-cal     Polyethyl Glycol-Propyl Glycol 0.4-0.3  % Soln  Apply 1 drop to eye 2 (two) times daily. Both eyes     risperiDONE 0.25 MG tablet  Commonly known as:  RISPERDAL  Take 1 tablet (0.25 mg total) by mouth at bedtime.        No orders of the defined types were placed in this encounter.    Immunization History  Administered Date(s) Administered  . Influenza Whole 02/27/2013  . Influenza-Unspecified 02/26/2014  . Pneumococcal-Unspecified 10/06/2011    Social History  Substance Use Topics  . Smoking status: Former Smoker -- 1.00 packs/day    Types: Cigarettes  . Smokeless tobacco: Not on file  . Alcohol Use: No    Review of Systems  UTO from pt 2/2 dementia; nursing without concerns    Filed Vitals:   05/19/15 1258  BP: 128/78  Pulse: 80  Temp: 96.4 F (35.8 C)  Resp: 20    Physical Exam  GENERAL APPEARANCE: Alert, nonconversant, No acute distress, wearing sunglasses in bed  SKIN: No diaphoresis rash HEENT: Unremarkable RESPIRATORY: Breathing is even, unlabored. Lung sounds are clear   CARDIOVASCULAR: Heart RRR no murmurs, rubs or gallops. No peripheral edema  GASTROINTESTINAL: Abdomen is soft, non-tender, not distended w/ normal bowel sounds.  GENITOURINARY: Bladder non tender, not distended  MUSCULOSKELETAL: No abnormal joints or  musculature NEUROLOGIC: Cranial nerves 2-12 grossly intact; R side hemiparesis PSYCHIATRIC: dementia, no behavioral issues  Patient Active Problem List   Diagnosis Date Noted  . Anemia of chronic disease 03/14/2015  . Psychosis 03/14/2015  . Loss of weight 04/24/2014  . FTT (failure to thrive) in adult 04/24/2014  . Chronic pain 04/24/2014  . Hemiparesis affecting left side as late effect of stroke (HCC) 04/24/2014  . PVD (peripheral vascular disease) (HCC) 03/20/2014  . 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 Depression 02/17/2013  . Hypertension   . Dementia without behavioral disturbance   . Bipolar 1 disorder (HCC)   . Cerebrovascular accident (HCC)    . Hypothyroidism 09/23/2012  . Dyslipidemia 09/23/2012    CBC    Component Value Date/Time   WBC 8.9 11/16/2014   WBC 7.8 07/24/2013 1651   RBC 4.22 07/24/2013 1651   HGB 11.9* 11/16/2014   HCT 40 11/16/2014   PLT 316 11/16/2014   MCV 85.5 07/24/2013 1651   LYMPHSABS 1.6 09/26/2010 0522   MONOABS 1.4* 09/26/2010 0522   EOSABS 0.1 09/26/2010 0522   BASOSABS 0.0 09/26/2010 0522    CMP     Component Value Date/Time   NA 134* 11/16/2014   NA 128* 07/24/2013 1651   K 3.6 11/26/2014   CL 89* 07/24/2013 1651   CO2 27 07/24/2013 1651   GLUCOSE 119* 07/24/2013 1651   BUN 4 11/16/2014   BUN 7 07/24/2013 1651   CREATININE 0.3* 11/16/2014   CREATININE 0.78 07/24/2013 1651   CALCIUM 9.5 07/24/2013 1651   PROT 7.0 07/24/2013 1651   ALBUMIN 3.2* 07/24/2013 1651   AST 18 07/24/2013 1651   ALT 9 07/24/2013 1651   ALKPHOS 86 07/24/2013 1651   BILITOT 0.3 07/24/2013 1651   GFRNONAA 82* 07/24/2013 1651   GFRAA >90 07/24/2013 1651    Assessment and Plan  Dementia without behavioral disturbance End stage; is not on medications; she is followed by hospice care; her weight is 108 pounds her status is end stage weight loss is an expected outcome at this stage of her disease process.   Bipolar 1 disorder Stable;Continue her depakote 250 mg twice daily and risperdal 0.25 mg nightly and will monitor her status.    Hemiparesis affecting left side as late effect of stroke Right hemiparesis: is presently not on medications; has right hemiparesis with contractures present. Does have a splint present Will monitor     Margit HanksALEXANDER, Merrily Tegeler D, MD

## 2015-05-19 ENCOUNTER — Encounter: Payer: Self-pay | Admitting: Internal Medicine

## 2015-05-19 NOTE — Assessment & Plan Note (Signed)
End stage; is not on medications; she is followed by hospice care; her weight is 108 pounds her status is end stage weight loss is an expected outcome at this stage of her disease process.

## 2015-05-19 NOTE — Assessment & Plan Note (Signed)
Right hemiparesis: is presently not on medications; has right hemiparesis with contractures present. Does have a splint present Will monitor

## 2015-05-19 NOTE — Assessment & Plan Note (Signed)
Stable;Continue her depakote 250 mg twice daily and risperdal 0.25 mg nightly and will monitor her status.

## 2015-06-04 LAB — HM DIABETES FOOT EXAM

## 2015-06-18 ENCOUNTER — Non-Acute Institutional Stay (SKILLED_NURSING_FACILITY): Payer: Medicare Other | Admitting: Adult Health

## 2015-06-18 DIAGNOSIS — G8929 Other chronic pain: Secondary | ICD-10-CM | POA: Diagnosis not present

## 2015-06-18 DIAGNOSIS — I638 Other cerebral infarction: Secondary | ICD-10-CM | POA: Diagnosis not present

## 2015-06-18 DIAGNOSIS — I69354 Hemiplegia and hemiparesis following cerebral infarction affecting left non-dominant side: Secondary | ICD-10-CM

## 2015-06-18 DIAGNOSIS — F0390 Unspecified dementia without behavioral disturbance: Secondary | ICD-10-CM

## 2015-06-18 DIAGNOSIS — R627 Adult failure to thrive: Secondary | ICD-10-CM

## 2015-06-18 DIAGNOSIS — I6389 Other cerebral infarction: Secondary | ICD-10-CM

## 2015-06-18 DIAGNOSIS — F319 Bipolar disorder, unspecified: Secondary | ICD-10-CM | POA: Diagnosis not present

## 2015-07-15 ENCOUNTER — Encounter: Payer: Self-pay | Admitting: Internal Medicine

## 2015-07-15 ENCOUNTER — Non-Acute Institutional Stay (SKILLED_NURSING_FACILITY): Payer: Medicare Other | Admitting: Internal Medicine

## 2015-07-15 DIAGNOSIS — F250 Schizoaffective disorder, bipolar type: Secondary | ICD-10-CM

## 2015-07-15 DIAGNOSIS — R627 Adult failure to thrive: Secondary | ICD-10-CM | POA: Diagnosis not present

## 2015-07-15 DIAGNOSIS — I69354 Hemiplegia and hemiparesis following cerebral infarction affecting left non-dominant side: Secondary | ICD-10-CM

## 2015-07-15 NOTE — Progress Notes (Signed)
MRN: [id number] Name: [name]  Sex: female Age: 75 y.o. DOB: 1940/06/18  PSC #: [id number] Facility/Room: Level Of Care: SNF Provider: Merrilee Seashore D Emergency Contacts: Extended Emergency Contact Information Primary Emergency Contact: Sandria Senter Address: [address]           [place], [place] [address] Darden [place] of [place] Home Phone: [phone] Mobile Phone: [phone] Relation: Son Secondary Emergency Contact: Flury,RAYMOND Address: [address]          [place], [place] [address] Home Phone: [phone] Relation: None  Code Status:   Allergies: Demerol; Ampicillin; Aspirin; Codeine; Dilaudid; Elavil; Flexeril; Nsaids; Other; and Penicillins  Chief Complaint  Patient presents with  . Medical Management of Chronic Issues    HPI: Patient is 75 y.o. female who is being seen for routine issues of hemiparesis s/p CVA, psychosis, and FTT.  Past Medical History  Diagnosis Date  . Hypertension   . Allergy   . Bipolar 1 disorder (HCC)   . Dementia   . GERD (gastroesophageal reflux disease)   . Hyponatremia   . Schizoaffective disorder   . Cerebrovascular accident (HCC)   . Anemia   . Thyroid disease     Past Surgical History  Procedure Laterality Date  . Appendectomy    . Fracture surgery        Medication List       This list is accurate as of: 07/15/15 11:59 PM.  Always use your most recent med list.               DECUBI-VITE PO  Take 2 capsules by mouth daily.     divalproex 125 MG capsule  Commonly known as:  DEPAKOTE SPRINKLE  Take 250 mg by mouth 2 (two) times daily. For anxiety disorder     HYDROcodone-acetaminophen 5-325 MG tablet  Commonly known as:  NORCO/VICODIN  Take one tablet by mouth every 6 hours for pain. Not to exceed  of APAP from all sources /24hr     NUTRITIONAL SUPPLEMENT PO  Take 90 mLs by mouth 3 (three) times daily. 2-cal     Polyethyl Glycol-Propyl Glycol 0.4-0.3 % Soln  Apply 1 drop to eye 2 (two) times  daily. Both eyes     risperiDONE 0.25 MG tablet  Commonly known as:  RISPERDAL  Take 1 tablet (0.25 mg total) by mouth at bedtime.        No orders of the defined types were placed in this encounter.    Immunization History  Administered Date(s) Administered  . Influenza Whole 02/27/2013  . Influenza-Unspecified 02/26/2014  . Pneumococcal-Unspecified 10/06/2011    Social History  Substance Use Topics  . Smoking status: Former Smoker -- 1.00 packs/day    Types: Cigarettes  . Smokeless tobacco: Not on file  . Alcohol Use: No    Review of Systems  UTO 2/2 dementia   Filed Vitals:   07/15/15 1454  BP: 112/73  Pulse: 76  Temp: 98 F (36.7 C)  Resp: 18    Physical Exam  GENERAL APPEARANCE: Alert,No acute distress, in bed wearing her signature sunglasses  SKIN: No diaphoresis rash HEENT: Unremarkable RESPIRATORY: Breathing is even, unlabored. Lung sounds are clear   CARDIOVASCULAR: Heart RRR no murmurs, rubs or gallops. No peripheral edema  GASTROINTESTINAL: Abdomen is soft, non-tender, not distended w/ normal bowel sounds.  GENITOURINARY: Bladder non tender, not distended  MUSCULOSKELETAL: multiple contractures NEUROLOGIC: Cranial nerves 2-12 grossly intact; R hemiparesis PSYCHIATRIC: dementia, no behavioral issues  Patient Active  Problem List   Diagnosis Date Noted  . Anemia of chronic disease 03/14/2015  . Psychosis 03/14/2015  . Loss of weight 04/24/2014  . FTT (failure to thrive) in adult 04/24/2014  . Chronic pain 04/24/2014  . Hemiparesis affecting left side as late effect of stroke (HCC) 04/24/2014  . PVD (peripheral vascular disease) (HCC) 03/20/2014  . 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 Depression 02/17/2013  . Hypertension   . Dementia without behavioral disturbance   . Bipolar 1 disorder (HCC)   . Cerebrovascular accident (HCC)   . Hypothyroidism 09/23/2012  . Dyslipidemia 09/23/2012    CBC    Component Value Date/Time    WBC 8.9 11/16/2014   WBC 7.8 07/24/2013 1651   RBC 4.22 07/24/2013 1651   HGB 11.9* 11/16/2014   HCT 40 11/16/2014   PLT 316 11/16/2014   MCV 85.5 07/24/2013 1651   LYMPHSABS 1.6 09/26/2010 0522   MONOABS 1.4* 09/26/2010 0522   EOSABS 0.1 09/26/2010 0522   BASOSABS 0.0 09/26/2010 0522    CMP     Component Value Date/Time   NA 134* 11/16/2014   NA 128* 07/24/2013 1651   K 3.6 11/26/2014   CL 89* 07/24/2013 1651   CO2 27 07/24/2013 1651   GLUCOSE 119* 07/24/2013 1651   BUN 4 11/16/2014   BUN 7 07/24/2013 1651   CREATININE 0.3* 11/16/2014   CREATININE 0.78 07/24/2013 1651   CALCIUM 9.5 07/24/2013 1651   PROT 7.0 07/24/2013 1651   ALBUMIN 3.2* 07/24/2013 1651   AST 18 07/24/2013 1651   ALT 9 07/24/2013 1651   ALKPHOS 86 07/24/2013 1651   BILITOT 0.3 07/24/2013 1651   GFRNONAA 82* 07/24/2013 1651   GFRAA >90 07/24/2013 1651    Assessment and Plan  Hemiparesis affecting left side as late effect of stroke is presently not on medications; has right hemiparesis with contractures present. Does have a splint present Will monitor   Psychosis Cont risperdal; stable   FTT (failure to thrive) in adult will continue her supplements per the facility protocol. Her current weight is 108 pounds with 148 pounds in may 2016. . Will not make changes and will monitor her status.     Margit Hanks, MD

## 2015-07-21 ENCOUNTER — Encounter: Payer: Self-pay | Admitting: Internal Medicine

## 2015-07-21 NOTE — Assessment & Plan Note (Signed)
Cont risperdal; stable

## 2015-07-21 NOTE — Assessment & Plan Note (Signed)
will continue her supplements per the facility protocol. Her current weight is 108 pounds with 148 pounds in may 2016. . Will not make changes and will monitor her status.

## 2015-07-21 NOTE — Assessment & Plan Note (Signed)
is presently not on medications; has right hemiparesis with contractures present. Does have a splint present Will monitor

## 2015-08-05 NOTE — Progress Notes (Signed)
Patient ID: [name], female   DOB: [birth year], 75 y.o.   MRN: [id number]   Facility:  [facility]       Allergies  Allergen Reactions  . Demerol     Per MAR.    Marland Kitchen Ampicillin     Per facility [facility].   Marland Kitchen Aspirin     Per facility [facility].    Marland Kitchen Codeine     Per MAR.    . Dilaudid [Hydromorphone Hcl]     Per MAR.   Marland Kitchen Elavil [Amitriptyline]     Per MAR.   Marland Kitchen Flexeril [Cyclobenzaprine Hcl]     Per MAR.    . Nsaids     Per facility [facility].    . Other     COX2 inhibitors. Per facility [facility].    Marland Kitchen Penicillins     Per MAR.      Chief Complaint  Patient presents with  . Medical Management of Chronic Issues    HPI:  She is a long term resident of this facility being seen for the management of her chronic illnesses. She continues to be followed by hospice care. The focus of her care is comfort only. She is unable to participate in the hpi or ros. There are no nursing concerns at this time.   Past Medical History  Diagnosis Date  . Hypertension   . Allergy   . Bipolar 1 disorder (HCC)   . Dementia   . GERD (gastroesophageal reflux disease)   . Hyponatremia   . Schizoaffective disorder   . Cerebrovascular accident (HCC)   . Anemia   . Thyroid disease     Past Surgical History  Procedure Laterality Date  . Appendectomy    . Fracture surgery      VITAL SIGNS BP 109/56 mmHg  Pulse 69  Ht  (1.626 m)  Wt 108 lb (48.988 kg)  BMI 18.53 kg/m2  Patient's Medications  New Prescriptions   No medications on file  Previous Medications   DIVALPROEX (DEPAKOTE SPRINKLE) 125 MG CAPSULE    Take 125 mg in the AM and 375 mg in the PM    Ativan 0.5 mg Take 0.25 mg every 8 hours as needed    HYDROCODONE-ACETAMINOPHEN (NORCO/VICODIN) 5-325 MG PER TABLET    Take one tablet by mouth every 6 hours for pain. Not to exceed  of APAP from all sources /24hr   MULTIPLE VITAMINS-MINERALS (DECUBI-VITE PO)    Take 2 capsules by mouth daily.   NUTRITIONAL SUPPLEMENTS (NUTRITIONAL SUPPLEMENT  PO)    Take 90 mLs by mouth 3 (three) times daily. 2-cal   POLYETHYL GLYCOL-PROPYL GLYCOL 0.4-0.3 % SOLN    Apply 1 drop to eye 2 (two) times daily. Both eyes   RISPERIDONE (RISPERDAL) 0.25 MG TABLET    Take 0.5 mg total by mouth at bedtime.  Modified Medications   No medications on file  Discontinued Medications   No medications on file     SIGNIFICANT DIAGNOSTIC EXAMS   not a candidate for mammogram; dexa or colonoscopy  07-16-14: left forearm x-ray: no acute fracture  07-16-14: left humerus x-ray: no acute fracture    LABS REVIEWED:   01-31-14: wbc 6.6 ;hgb 12.9; hct 40.3; mcv 88.8; plt 297; glucose 118; bun 4.0; creat 0.49; k+3.5; na++134; liver normal albumin 2.9; tsh 10.90; depakote 59 02-02-14: urine culture: urine culture: e-coli: levaquin 03-09-14: tsh 11.06  04-25-14: wbc 9.0; hgb 10.3; hct 33.8; mcv95.4; plt 245; glucose 77; bun 15.4; creat 0.47;  k+3.7; na++135; liver normal albumin 2.8; chol 129; ldl 77; trig 122  06-02-14: tsh 2.24  06-05-14: depakote 32  11-16-14: wbc 8.4; hgb 11.9; hct 39.6; mcv 86.3; plt 316; glucose 94; bun 36; creat 0.3; k+3.2; na++134; liver normal albumin 2.9      Review of Systems Unable to perform ROS: dementia     Physical Exam Constitutional: No distress.  Neck: Neck supple. No JVD present. No thyromegaly present.  Cardiovascular: Normal rate, normal heart sounds and intact distal pulses.   Heart rate slightly irregular  Pedal pulses not palpable   Respiratory: Effort normal and breath sounds normal. No respiratory distress.  GI: Soft. Bowel sounds are normal. She exhibits no distension. There is no tenderness.  Musculoskeletal: She exhibits no edema.  Right upper extremity contracture especially in the wrist  Able to move left upper extremity Does not move lower extremities Has contracture in right lower extremity   Neurological: She is alert.  Skin: Skin is warm and dry. She is not diaphoretic.       ASSESSMENT/ PLAN:  1.  FTT/weight loss: will continue her supplements per the facility protocol. Her current weight is 108 pounds with  148 pounds in may 2016. . Will not make changes and will monitor her status.   2. Chronic pain: has contractures on  right  wrist/hand. Will continue vicodin 5/325 mg every 6 hours routinely and will monitor there are no indications of pain present.   3. Bipolar disorder: will continue her depakote sprinkles 125 mg in the AM and 375 mg in the PM  and risperdal 0.5 mg nightly and will monitor her status. Has ativan 0.25 mg every 8 hours as needed   4. Dementia: is end stage; is  not on medications; she is followed by hospice care; her weight is 108 pounds her status is end stage weight loss is an expected outcome at this stage of her disease process.   5. 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 Does have a splint present  Will monitor          Synthia Innocenteborah Gerrica Cygan NP [facility]  Contact [phone] Monday through Friday 8am- 5pm  After hours call [phone]

## 2015-08-19 ENCOUNTER — Non-Acute Institutional Stay (SKILLED_NURSING_FACILITY): Payer: Medicare Other | Admitting: Adult Health

## 2015-08-19 ENCOUNTER — Encounter: Payer: Self-pay | Admitting: Adult Health

## 2015-08-19 DIAGNOSIS — I69354 Hemiplegia and hemiparesis following cerebral infarction affecting left non-dominant side: Secondary | ICD-10-CM

## 2015-08-19 DIAGNOSIS — R634 Abnormal weight loss: Secondary | ICD-10-CM

## 2015-08-19 DIAGNOSIS — I6389 Other cerebral infarction: Secondary | ICD-10-CM

## 2015-08-19 DIAGNOSIS — F319 Bipolar disorder, unspecified: Secondary | ICD-10-CM | POA: Diagnosis not present

## 2015-08-19 DIAGNOSIS — G8929 Other chronic pain: Secondary | ICD-10-CM | POA: Diagnosis not present

## 2015-08-19 DIAGNOSIS — I638 Other cerebral infarction: Secondary | ICD-10-CM

## 2015-08-19 DIAGNOSIS — R627 Adult failure to thrive: Secondary | ICD-10-CM

## 2015-08-19 DIAGNOSIS — F0390 Unspecified dementia without behavioral disturbance: Secondary | ICD-10-CM | POA: Diagnosis not present

## 2015-08-19 NOTE — Progress Notes (Signed)
Patient ID: [name], female   DOB: [birth year], 75 y.o.   MRN: [id number]   Facility:  [facility]     CODE STATUS: DNR: IS HOSPICE PATIENT   Allergies  Allergen Reactions  . Demerol     Per MAR.    Marland Kitchen Ampicillin     Per facility [facility].   Marland Kitchen Aspirin     Per facility [facility].    Marland Kitchen Codeine     Per MAR.    . Dilaudid [Hydromorphone Hcl]     Per MAR.   Marland Kitchen Elavil [Amitriptyline]     Per MAR.   Marland Kitchen Flexeril [Cyclobenzaprine Hcl]     Per MAR.    . Nsaids     Per facility [facility].    . Other     COX2 inhibitors. Per facility [facility].    Marland Kitchen Penicillins     Per MAR.      Chief Complaint  Patient presents with  . Annual Exam    HPI:  She is a long term resident of this facility being for her annual exam. She has declined over the past year and is now followed by hospice care. She has not been hospitalized over the past year. The focus of her care is comfort. She is unable to participate in the hpi or ros. There are no nursing concerns at this time.     Past Medical History  Diagnosis Date  . Hypertension   . Allergy   . Bipolar 1 disorder (HCC)   . Dementia   . GERD (gastroesophageal reflux disease)   . Hyponatremia   . Schizoaffective disorder   . Cerebrovascular accident (HCC)   . Anemia   . Thyroid disease     Past Surgical History  Procedure Laterality Date  . Appendectomy    . Fracture surgery      History reviewed. No pertinent family history.  Social History   Social History  . Marital Status: Married    Spouse Name: N/A  . Number of Children: N/A  . Years of Education: N/A   Occupational History  . Not on file.   Social History Main Topics  . Smoking status: Former Smoker -- 1.00 packs/day    Types: Cigarettes  . Smokeless tobacco: Not on file  . Alcohol Use: No  . Drug Use: No  . Sexual Activity: Not on file   Other Topics Concern  . Not on file   Social History Narrative    Immunization History  Administered Date(s) Administered  . Influenza  Whole 02/27/2013  . Influenza-Unspecified 02/26/2014, 07/05/2015  . Pneumococcal-Unspecified 10/06/2011     VITAL SIGNS BP 110/64 mmHg  Pulse 82  Temp(Src) 97.8 F (36.6 C) (Oral)  Resp 18  Ht  (1.575 m)  Wt 92 lb (41.731 kg)  BMI 16.82 kg/m2  SpO2 97%  Patient's Medications  New Prescriptions   No medications on file  Previous Medications   DIVALPROEX (DEPAKOTE SPRINKLE) 125 MG CAPSULE    Take 250 mg by mouth 2 (two) times daily. For anxiety disorder Give 3 capsule qhs   HYDROCODONE-ACETAMINOPHEN (NORCO/VICODIN) 5-325 MG PER TABLET    Take one tablet by mouth every 6 hours for pain. Not to exceed  of APAP from all sources /24hr   LORAZEPAM (ATIVAN) 0.5 MG TABLET    Take 0.25 mg by mouth every 8 (eight) hours.   MULTIPLE VITAMINS-MINERALS (DECUBI-VITE PO)    Take 2 capsules by mouth daily.   NUTRITIONAL SUPPLEMENTS (NUTRITIONAL  SUPPLEMENT PO)    Take 90 mLs by mouth 3 (three) times daily. 2-cal   NYSTATIN (MYCOSTATIN/NYSTOP) 100000 UNIT/GM POWD    Apply topically as needed (PRN redness under breast).   POLYETHYL GLYCOL-PROPYL GLYCOL 0.4-0.3 % SOLN    Apply 1 drop to eye 2 (two) times daily. Both eyes   RISPERIDONE (RISPERDAL) 0.25 MG TABLET    Take 1 tablet (0.25 mg total) by mouth at bedtime.  Modified Medications   No medications on file  Discontinued Medications   No medications on file     SIGNIFICANT DIAGNOSTIC EXAMS  not a candidate for mammogram; dexa or colonoscopy  07-16-14: left forearm x-ray: no acute fracture  07-16-14: left humerus x-ray: no acute fracture    LABS REVIEWED:   01-31-14: wbc 6.6 ;hgb 12.9; hct 40.3; mcv 88.8; plt 297; glucose 118; bun 4.0; creat 0.49; k+3.5; na++134; liver normal albumin 2.9; tsh 10.90; depakote 59 02-02-14: urine culture: urine culture: e-coli: levaquin 03-09-14: tsh 11.06  04-25-14: wbc 9.0; hgb 10.3; hct 33.8; mcv95.4; plt 245; glucose 77; bun 15.4; creat 0.47; k+3.7; na++135; liver normal albumin 2.8; chol 129;  ldl 77; trig 122  06-02-14: tsh 2.24  06-05-14: depakote 32  11-16-14: wbc 8.4; hgb 11.9; hct 39.6; mcv 86.3; plt 316; glucose 94; bun 36; creat 0.3; k+3.2; na++134; liver normal albumin 2.9      Review of Systems Unable to perform ROS: dementia     Physical Exam Constitutional: No distress.  Neck: Neck supple. No JVD present. No thyromegaly present.  Cardiovascular: Normal rate, normal heart sounds and intact distal pulses.   Heart rate slightly irregular  Pedal pulses not palpable   Respiratory: Effort normal and breath sounds normal. No respiratory distress.  GI: Soft. Bowel sounds are normal. She exhibits no distension. There is no tenderness.  Musculoskeletal: She exhibits no edema.  Right upper extremity contracture especially in the wrist  Able to move left upper extremity Does not move lower extremities Has contracture in right lower extremity   Neurological: She is alert.  Skin: Skin is warm and dry. She is not diaphoretic.       ASSESSMENT/ PLAN:  1. FTT/weight loss: will continue her supplements per the facility protocol. Her current weight is 92 pounds with  148 pounds in may 2016. . Will not make changes and will monitor her status.   2. Chronic pain: has contractures on  right  wrist/hand. Will continue vicodin 5/325 mg every 6 hours routinely and will monitor there are no indications of pain present.   3. Bipolar disorder: will continue her depakote sprinkles 250 mg in the AM and 375 mg in the PM  and risperdal 0.5 mg nightly and will monitor her status. Has ativan 0.25 mg every 8 hours as needed   4. Dementia: is end stage; is  not on medications; she is followed by hospice care; her weight is 92 pounds her status is end stage weight loss is an expected outcome at this stage of her disease process.   5. 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 Does have a splint present  Will monitor    Her health  maintenance is up to date    Time spent with patient  45   minutes >50% time spent counseling; reviewing medical record; tests; labs; and developing future plan of care    Synthia Innocenteborah Cayle Thunder NP [facility]  Contact [phone] Monday through Friday 8am- 5pm  After hours call [phone]

## 2015-09-06 ENCOUNTER — Encounter: Payer: Self-pay | Admitting: Adult Health

## 2015-09-16 ENCOUNTER — Non-Acute Institutional Stay (SKILLED_NURSING_FACILITY): Payer: Medicare Other | Admitting: Internal Medicine

## 2015-09-16 ENCOUNTER — Encounter: Payer: Self-pay | Admitting: Internal Medicine

## 2015-09-16 DIAGNOSIS — F319 Bipolar disorder, unspecified: Secondary | ICD-10-CM | POA: Diagnosis not present

## 2015-09-16 DIAGNOSIS — F0390 Unspecified dementia without behavioral disturbance: Secondary | ICD-10-CM

## 2015-09-16 DIAGNOSIS — R627 Adult failure to thrive: Secondary | ICD-10-CM

## 2015-09-16 NOTE — Progress Notes (Signed)
MRN: [id number] Name: [name]  Sex: female Age: 75 y.o. DOB: 1941/03/10  PSC #: Ronni Rumble Facility/Room:227 Level Of Care: SNF Provider: Merrilee Seashore MD Emergency Contacts: Extended Emergency Contact Information Primary Emergency Contact: Sandria Senter Address: [address]           [place], [place] [address] [place] of [place] Home Phone: [phone] Mobile Phone: [phone] Relation: Son Secondary Emergency Contact: Peatross,RAYMOND Address: [address]          [place], [place] [address] Home Phone: [phone] Relation: None  Code Status:   Allergies: Demerol; Ampicillin; Aspirin; Codeine; Dilaudid; Elavil; Flexeril; Nsaids; Other; and Penicillins  Chief Complaint  Patient presents with  . Medical Management of Chronic Issues    HPI: Patient is 75 y.o. female is a Hospice pt who is being sen for routine  Of FTT, dementia and bipolar disease.  Past Medical History  Diagnosis Date  . Hypertension   . Allergy   . Bipolar 1 disorder (HCC)   . Dementia   . GERD (gastroesophageal reflux disease)   . Hyponatremia   . Schizoaffective disorder   . Cerebrovascular accident (HCC)   . Anemia   . Thyroid disease   . 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 Anemia of chronic disease 03/14/2015    Past Surgical History  Procedure Laterality Date  . Appendectomy    . Fracture surgery        Medication List       This list is accurate as of: 09/16/15  8:50 PM.  Always use your most recent med list.               DECUBI-VITE PO  Take 2 capsules by mouth daily.     divalproex 125 MG capsule  Commonly known as:  DEPAKOTE SPRINKLE  Take 250 mg by mouth 2 (two) times daily. For anxiety disorder Give 3 capsule qhs     HYDROcodone-acetaminophen 5-325 MG tablet  Commonly known as:  NORCO/VICODIN  Take one tablet by mouth every 6 hours for pain. Not to exceed  of APAP from all sources /24hr     LORazepam 0.5 MG tablet  Commonly known as:   ATIVAN  Take 0.25 mg by mouth every 8 (eight) hours.     NUTRITIONAL SUPPLEMENT PO  Take 90 mLs by mouth 3 (three) times daily. 2-cal     nystatin powder  Generic drug:  nystatin  Apply topically as needed (PRN redness under breast).     Polyethyl Glycol-Propyl Glycol 0.4-0.3 % Soln  Apply 1 drop to eye 2 (two) times daily. Both eyes     risperiDONE 0.25 MG tablet  Commonly known as:  RISPERDAL  Take 1 tablet (0.25 mg total) by mouth at bedtime.        No orders of the defined types were placed in this encounter.    Immunization History  Administered Date(s) Administered  . Influenza Whole 02/27/2013  . Influenza-Unspecified 02/26/2014, 07/05/2015  . Pneumococcal-Unspecified 10/06/2011    Social History  Substance Use Topics  . Smoking status: Former Smoker -- 1.00 packs/day    Types: Cigarettes  . Smokeless tobacco: Not on file  . Alcohol Use: No    Review of Systems  UTO 2/2 dementia; nursing without concerns    Filed Vitals:   09/16/15 1340  BP: 90/47  Pulse: 75  Temp: 97.3 F (36.3 C)  Resp: 18    Physical Exam  GENERAL APPEARANCE: Alert, non conversant, No acute  distress wearing her trademark sunglasses SKIN: No diaphoresis rash HEENT: Unremarkable RESPIRATORY: Breathing is even, unlabored. Lung sounds are clear   CARDIOVASCULAR: Heart RRR no murmurs, rubs or gallops. No peripheral edema  GASTROINTESTINAL: Abdomen is soft, non-tender, not distended w/ normal bowel sounds.  GENITOURINARY: Bladder non tender, not distended  MUSCULOSKELETAL:wasting and contractures all extremities NEUROLOGIC: Cranial nerves 2-12 grossly intact; some movement of LUE PSYCHIATRIC: dementia, no behavioral issues  Patient Active Problem List   Diagnosis Date Noted  . Anemia of chronic disease 03/14/2015  . Psychosis 03/14/2015  . Loss of weight 04/24/2014  . FTT (failure to thrive) in adult 04/24/2014  . Chronic pain 04/24/2014  . Hemiparesis affecting left side as  late effect of stroke (HCC) 04/24/2014  . Depression 02/17/2013  . Hypertension   . Dementia without behavioral disturbance   . Bipolar 1 disorder (HCC)   . Cerebrovascular accident (HCC)   . Hypothyroidism 09/23/2012  . Dyslipidemia 09/23/2012    CBC    Component Value Date/Time   WBC 8.9 11/16/2014   WBC 7.8 07/24/2013 1651   RBC 4.22 07/24/2013 1651   HGB 11.9* 11/16/2014   HCT 40 11/16/2014   PLT 316 11/16/2014   MCV 85.5 07/24/2013 1651   LYMPHSABS 1.6 09/26/2010 0522   MONOABS 1.4* 09/26/2010 0522   EOSABS 0.1 09/26/2010 0522   BASOSABS 0.0 09/26/2010 0522    CMP     Component Value Date/Time   NA 134* 11/16/2014   NA 128* 07/24/2013 1651   K 3.6 11/26/2014   CL 89* 07/24/2013 1651   CO2 27 07/24/2013 1651   GLUCOSE 119* 07/24/2013 1651   BUN 4 11/16/2014   BUN 7 07/24/2013 1651   CREATININE 0.3* 11/16/2014   CREATININE 0.78 07/24/2013 1651   CALCIUM 9.5 07/24/2013 1651   PROT 7.0 07/24/2013 1651   ALBUMIN 3.2* 07/24/2013 1651   AST 18 07/24/2013 1651   ALT 9 07/24/2013 1651   ALKPHOS 86 07/24/2013 1651   BILITOT 0.3 07/24/2013 1651   GFRNONAA 82* 07/24/2013 1651   GFRAA >90 07/24/2013 1651    Assessment and Plan  FTT (failure to thrive) in adult current weight is 92 pounds with 148 pounds in may 2016. . Will not make changes and will monitor her status.   Dementia without behavioral disturbance is end stage; is not on medications; she is followed by hospice care; her weight is 92 pounds her status is end stage weight loss is an expected outcome at this stage of her disease process.   Bipolar 1 disorder Continues on risperdal and depakote for mood; this is considered comfort    Merrilee SeashoreAlexander, Marceline Napierala  MD

## 2015-09-16 NOTE — Assessment & Plan Note (Signed)
Continues on risperdal and depakote for mood; this is considered comfort

## 2015-09-16 NOTE — Assessment & Plan Note (Signed)
current weight is 92 pounds with 148 pounds in may 2016. . Will not make changes and will monitor her status.

## 2015-09-16 NOTE — Assessment & Plan Note (Signed)
is end stage; is not on medications; she is followed by hospice care; her weight is 92 pounds her status is end stage weight loss is an expected outcome at this stage of her disease process.

## 2015-10-18 ENCOUNTER — Encounter: Payer: Self-pay | Admitting: Adult Health

## 2015-10-18 ENCOUNTER — Non-Acute Institutional Stay (SKILLED_NURSING_FACILITY): Payer: Medicare Other | Admitting: Adult Health

## 2015-10-18 DIAGNOSIS — F319 Bipolar disorder, unspecified: Secondary | ICD-10-CM

## 2015-10-18 DIAGNOSIS — R627 Adult failure to thrive: Secondary | ICD-10-CM | POA: Diagnosis not present

## 2015-10-18 DIAGNOSIS — F0390 Unspecified dementia without behavioral disturbance: Secondary | ICD-10-CM

## 2015-10-18 DIAGNOSIS — G8929 Other chronic pain: Secondary | ICD-10-CM | POA: Diagnosis not present

## 2015-10-18 DIAGNOSIS — I69354 Hemiplegia and hemiparesis following cerebral infarction affecting left non-dominant side: Secondary | ICD-10-CM

## 2015-10-18 DIAGNOSIS — I6389 Other cerebral infarction: Secondary | ICD-10-CM

## 2015-10-18 DIAGNOSIS — I638 Other cerebral infarction: Secondary | ICD-10-CM

## 2015-10-18 NOTE — Progress Notes (Signed)
Patient ID: [name], female   DOB: 08/23/1940, 75 y.o.   MRN: [id number]    Location:  [facility] Room Number: 227-B Place of Service:  SNF (31)   CODE STATUS: DNR  Allergies  Allergen Reactions  . Demerol     Per MAR.    Marland Kitchen Ampicillin     Per facility [facility].   Marland Kitchen Aspirin     Per facility [facility].    Marland Kitchen Codeine     Per MAR.    . Dilaudid [Hydromorphone Hcl]     Per MAR.   Marland Kitchen Elavil [Amitriptyline]     Per MAR.   Marland Kitchen Flexeril [Cyclobenzaprine Hcl]     Per MAR.    . Nsaids     Per facility [facility].    . Other     COX2 inhibitors. Per facility [facility].    Marland Kitchen Penicillins     Per MAR.      Chief Complaint  Patient presents with  . Medical Management of Chronic Issues    Follow up    HPI:  She is a long term resident of this facility being seen for the management of her chronic illnesses. She is followed by hospice care. She does continue to slowly decline. Her current weight is 96 pounds. She is unable to participate in the hpi or ros. There are no nursing concerns at this time.   Past Medical History  Diagnosis Date  . Hypertension   . Allergy   . Bipolar 1 disorder (HCC)   . Dementia   . GERD (gastroesophageal reflux disease)   . Hyponatremia   . Schizoaffective disorder   . Cerebrovascular accident (HCC)   . Anemia   . Thyroid disease   . 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 Anemia of chronic disease 03/14/2015    Past Surgical History  Procedure Laterality Date  . Appendectomy    . Fracture surgery      Social History   Social History  . Marital Status: Married    Spouse Name: N/A  . Number of Children: N/A  . Years of Education: N/A   Occupational History  . Not on file.   Social History Main Topics  . Smoking status: Former Smoker -- 1.00 packs/day    Types: Cigarettes  . Smokeless tobacco: Not on file  . Alcohol Use: No  . Drug Use: No  . Sexual Activity: Not on file   Other Topics Concern  .  Not on file   Social History Narrative   History reviewed. No pertinent family history.    VITAL SIGNS BP 94/55 mmHg  Pulse 89  Temp(Src) 97.7 F (36.5 C) (Oral)  Resp 18  Ht  (1.575 m)  Wt 96 lb 2 oz (43.602 kg)  BMI 17.58 kg/m2  SpO2 98%  Patient's Medications  New Prescriptions   No medications on file  Previous Medications   BISMUTH TRIBROMOPH-PETROLATUM (XEROFORM PETROLAT GAUZE 1"X8" EX)    Apply topically. Apply mid chest once daily every three days   COLLAGENASE (SANTYL) OINTMENT    Apply 1 application topically daily. Right heel, right lateral knee, and right shin   DIVALPROEX (DEPAKOTE SPRINKLE) 125 MG CAPSULE    Take 250 mg by mouth 2 (two) times daily. For anxiety disorder Give 3 capsule qhs   HYDROCODONE-ACETAMINOPHEN (NORCO/VICODIN) 5-325 MG PER TABLET    Take one tablet by mouth every 6 hours for pain. Not to exceed  of  APAP from all sources /24hr   LORAZEPAM (ATIVAN) 0.5 MG TABLET    Take 0.25 mg by mouth every 8 (eight) hours.   MULTIPLE VITAMINS-MINERALS (DECUBI-VITE PO)    Take 2 capsules by mouth daily.   NUTRITIONAL SUPPLEMENTS (NUTRITIONAL SUPPLEMENT PO)    Take 90 mLs by mouth 3 (three) times daily. 2-cal   NYSTATIN (MYCOSTATIN/NYSTOP) 100000 UNIT/GM POWD    Apply topically as needed (PRN redness under breast).   POLYETHYL GLYCOL-PROPYL GLYCOL 0.4-0.3 % SOLN    Apply 1 drop to eye 2 (two) times daily. Both eyes   RISPERIDONE (RISPERDAL) 0.25 MG TABLET    Take 1 tablet (0.25 mg total) by mouth at bedtime.  Modified Medications   No medications on file  Discontinued Medications   No medications on file     SIGNIFICANT DIAGNOSTIC EXAMS  not a candidate for mammogram; dexa or colonoscopy  07-16-14: left forearm x-ray: no acute fracture  07-16-14: left humerus x-ray: no acute fracture    LABS REVIEWED:   01-31-14: wbc 6.6 ;hgb 12.9; hct 40.3; mcv 88.8; plt 297; glucose 118; bun 4.0; creat 0.49; k+3.5; na++134; liver normal albumin 2.9; tsh  10.90; depakote 59 02-02-14: urine culture: urine culture: e-coli: levaquin 03-09-14: tsh 11.06  04-25-14: wbc 9.0; hgb 10.3; hct 33.8; mcv95.4; plt 245; glucose 77; bun 15.4; creat 0.47; k+3.7; na++135; liver normal albumin 2.8; chol 129; ldl 77; trig 122  06-02-14: tsh 2.24  06-05-14: depakote 32  11-16-14: wbc 8.4; hgb 11.9; hct 39.6; mcv 86.3; plt 316; glucose 94; bun 36; creat 0.3; k+3.2; na++134; liver normal albumin 2.9      Review of Systems Unable to perform ROS: dementia     Physical Exam Constitutional: No distress.  Neck: Neck supple. No JVD present. No thyromegaly present.  Cardiovascular: Normal rate, normal heart sounds and intact distal pulses.   Heart rate slightly irregular  Pedal pulses not palpable   Respiratory: Effort normal and breath sounds normal. No respiratory distress.  GI: Soft. Bowel sounds are normal. She exhibits no distension. There is no tenderness.  Musculoskeletal: She exhibits no edema.  Right upper extremity contracture especially in the wrist  Able to move left upper extremity Does not move lower extremities Has contracture in right lower extremity   Neurological: She is alert.  Skin: Skin is warm and dry. She is not diaphoretic. Stage III right knee 1.8 x 2 cm Right shin 2 x 2 cm unstaged Right heel unstaged: 2.2 x 2.2 cm Left inner knee: 0.3 x 0.3 cm         ASSESSMENT/ PLAN:  1. FTT/weight loss: will continue her supplements per the facility protocol. Her current weight is 96 pounds with  148 pounds in may 2016. . Will not make changes and will monitor her status.   2. Chronic pain: has contractures on  right  wrist/hand. Will continue vicodin 5/325 mg every 6 hours routinely and will monitor there are no indications of pain present.   3. Bipolar disorder: will continue her depakote sprinkles 250 mg in the AM and 375 mg in the PM  and risperdal 0.25 mg nightly and will monitor her status. Has ativan 0.25 mg every 8 hours as needed    4. Dementia: is end stage; is  not on medications; she is followed by hospice care; her weight is 92 pounds her status is end stage weight loss is an expected outcome at this stage of her disease process.   5. 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   Will monitor    Synthia Innocent NP [facility]  Contact [phone] Monday through Friday 8am- 5pm  After hours call [phone]

## 2015-11-04 ENCOUNTER — Other Ambulatory Visit: Payer: Self-pay | Admitting: *Deleted

## 2015-11-04 MED ORDER — HYDROCODONE-ACETAMINOPHEN 5-325 MG PO TABS: ORAL_TABLET | ORAL | Status: DC

## 2015-11-04 NOTE — Telephone Encounter (Signed)
[name] 

## 2015-11-21 ENCOUNTER — Encounter (HOSPITAL_COMMUNITY): Payer: Self-pay | Admitting: Emergency Medicine

## 2015-11-21 ENCOUNTER — Emergency Department (HOSPITAL_COMMUNITY)
Admission: EM | Admit: 2015-11-21 | Discharge: 2015-11-21 | Disposition: A | Discharge status: Home / Self Care | Payer: Medicare Other | Attending: Emergency Medicine | Admitting: Emergency Medicine

## 2015-11-21 DIAGNOSIS — Z79899 Other long term (current) drug therapy: Secondary | ICD-10-CM | POA: Diagnosis not present

## 2015-11-21 DIAGNOSIS — K922 Gastrointestinal hemorrhage, unspecified: Secondary | ICD-10-CM | POA: Diagnosis present

## 2015-11-21 DIAGNOSIS — Z87891 Personal history of nicotine dependence: Secondary | ICD-10-CM | POA: Diagnosis not present

## 2015-11-21 DIAGNOSIS — E876 Hypokalemia: Secondary | ICD-10-CM

## 2015-11-21 DIAGNOSIS — I739 Peripheral vascular disease, unspecified: Secondary | ICD-10-CM | POA: Insufficient documentation

## 2015-11-21 DIAGNOSIS — F259 Schizoaffective disorder, unspecified: Secondary | ICD-10-CM | POA: Diagnosis not present

## 2015-11-21 DIAGNOSIS — F319 Bipolar disorder, unspecified: Secondary | ICD-10-CM | POA: Insufficient documentation

## 2015-11-21 DIAGNOSIS — I10 Essential (primary) hypertension: Secondary | ICD-10-CM | POA: Insufficient documentation

## 2015-11-21 DIAGNOSIS — D649 Anemia, unspecified: Secondary | ICD-10-CM | POA: Diagnosis not present

## 2015-11-21 DIAGNOSIS — Z8673 Personal history of transient ischemic attack (TIA), and cerebral infarction without residual deficits: Secondary | ICD-10-CM | POA: Diagnosis not present

## 2015-11-21 DIAGNOSIS — L98499 Non-pressure chronic ulcer of skin of other sites with unspecified severity: Secondary | ICD-10-CM | POA: Insufficient documentation

## 2015-11-21 DIAGNOSIS — R112 Nausea with vomiting, unspecified: Secondary | ICD-10-CM

## 2015-11-21 LAB — ABO/RH: ABO/RH(D): B POS

## 2015-11-21 LAB — COMPREHENSIVE METABOLIC PANEL
ALK PHOS: 66 U/L (ref 38–126)
ALT: 10 U/L — ABNORMAL LOW (ref 14–54)
ANION GAP: 12 (ref 5–15)
AST: 19 U/L (ref 15–41)
Albumin: 2.7 g/dL — ABNORMAL LOW (ref 3.5–5.0)
BILIRUBIN TOTAL: 1.5 mg/dL — AB (ref 0.3–1.2)
BUN: 12 mg/dL (ref 6–20)
CALCIUM: 8.6 mg/dL — AB (ref 8.9–10.3)
CO2: 23 mmol/L (ref 22–32)
Chloride: 104 mmol/L (ref 101–111)
Creatinine, Ser: 0.4 mg/dL — ABNORMAL LOW (ref 0.44–1.00)
GFR calc non Af Amer: >60 mL/min (ref >60)
Glucose, Bld: 102 mg/dL — ABNORMAL HIGH (ref 65–99)
POTASSIUM: 3.1 mmol/L — AB (ref 3.5–5.1)
SODIUM: 139 mmol/L (ref 135–145)
TOTAL PROTEIN: 6.2 g/dL — AB (ref 6.5–8.1)

## 2015-11-21 LAB — CBC WITH DIFFERENTIAL/PLATELET
Basophils Absolute: 0 10*3/uL (ref 0.0–0.1)
Basophils Relative: 1 %
EOS ABS: 0 10*3/uL (ref 0.0–0.7)
EOS PCT: 0 %
HCT: 33.6 % — ABNORMAL LOW (ref 36.0–46.0)
HEMOGLOBIN: 11.1 g/dL — AB (ref 12.0–15.0)
LYMPHS ABS: 2.5 10*3/uL (ref 0.7–4.0)
Lymphocytes Relative: 30 %
MCH: 31.1 pg (ref 26.0–34.0)
MCHC: 33 g/dL (ref 30.0–36.0)
MCV: 94.1 fL (ref 78.0–100.0)
MONO ABS: 0.5 10*3/uL (ref 0.1–1.0)
MONOS PCT: 5 %
NEUTROS PCT: 64 %
Neutro Abs: 5.3 10*3/uL (ref 1.7–7.7)
Platelets: 442 10*3/uL — ABNORMAL HIGH (ref 150–400)
RBC: 3.57 MIL/uL — ABNORMAL LOW (ref 3.87–5.11)
RDW: 15.1 % (ref 11.5–15.5)
WBC: 8.3 10*3/uL (ref 4.0–10.5)

## 2015-11-21 LAB — HEMOGLOBIN AND HEMATOCRIT, BLOOD
HCT: 31.5 % — ABNORMAL LOW (ref 36.0–46.0)
Hemoglobin: 10.4 g/dL — ABNORMAL LOW (ref 12.0–15.0)

## 2015-11-21 LAB — TYPE AND SCREEN
ABO/RH(D): B POS
ANTIBODY SCREEN: NEGATIVE

## 2015-11-21 LAB — I-STAT CG4 LACTIC ACID, ED: Lactic Acid, Venous: 0.91 mmol/L (ref 0.5–1.9)

## 2015-11-21 LAB — POC OCCULT BLOOD, ED: Fecal Occult Bld: NEGATIVE

## 2015-11-21 MED ORDER — ONDANSETRON HCL 4 MG/2ML IJ SOLN
4.0000 mg | Freq: Once | INTRAMUSCULAR | Status: AC
  Administered 2015-11-21: 4 mg via INTRAVENOUS
  Filled 2015-11-21: qty 2

## 2015-11-21 MED ORDER — SODIUM CHLORIDE 0.9 % IV SOLN
1000.0000 mL | INTRAVENOUS | Status: DC
  Administered 2015-11-21: 1000 mL via INTRAVENOUS

## 2015-11-21 MED ORDER — SODIUM CHLORIDE 0.9 % IV SOLN
1000.0000 mL | Freq: Once | INTRAVENOUS | Status: AC
  Administered 2015-11-21: 1000 mL via INTRAVENOUS

## 2015-11-21 NOTE — ED Notes (Signed)
[facility]. Facility reported to EMS that patient vomited x2 the second being a large amount with coffee ground appearance. Facility also reports that patient appears slightly confused. EMS started iv and gave approximately 100ml of ns. Initial BP 92/50 came up to 104/57 after iv fluids.

## 2015-11-21 NOTE — Discharge Instructions (Signed)

## 2015-11-21 NOTE — ED Notes (Signed)
Patient oriented to self and place. Patient is non-ambulatory and in contracted position due to previous stroke. Patient makes soft screaming noise when touched or left alone for period of time. Patient also has repetitive speech.

## 2015-11-21 NOTE — ED Provider Notes (Signed)
CSN: [id number]     Arrival date & time 11/21/15  0028 History  By signing my name below, I, Rosario Adie, attest that this documentation has been prepared under the direction and in the presence of Dione Booze, MD.  Electronically Signed: Rosario Adie, ED Scribe. 11/21/2015. 1:20 AM.   Chief Complaint  Patient presents with  . GI Bleeding   LEVEL 5 CAVEAT: HPI and ROS limited due to dementia  The history is provided by the patient, the nursing home and the EMS personnel. No language interpreter was used.   HPI Comments: ORLANDA FRANKUM is a 75 y.o. female BIB EMS who presents to the Emergency Department with hematemesis x 2 episodes that occurred PTA. Patient comes in from [facility] assisted living facility after her episodes of emesis. Per nursing note, the blood was described as looking like coffee grounds. EMS began an IV en route and pt was given of normal saline.   Past Medical History  Diagnosis Date  . Hypertension   . Allergy   . Bipolar 1 disorder (HCC)   . Dementia   . GERD (gastroesophageal reflux disease)   . Hyponatremia   . Schizoaffective disorder   . Cerebrovascular accident (HCC)   . Anemia   . Thyroid disease   . 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 Anemia of chronic disease 03/14/2015   Past Surgical History  Procedure Laterality Date  . Appendectomy    . Fracture surgery     History reviewed. No pertinent family history. Social History  Substance Use Topics  . Smoking status: Former Smoker -- 1.00 packs/day    Types: Cigarettes  . Smokeless tobacco: None  . Alcohol Use: No   OB History    No data available     Review of Systems  Unable to perform ROS: Dementia  Gastrointestinal: Positive for vomiting (with blood).   Allergies  Demerol; Ampicillin; Aspirin; Codeine; Dilaudid; Elavil; Flexeril; Nsaids; Other; and Penicillins  Home Medications   Prior to Admission medications   Medication Sig Start Date  End Date Taking? Authorizing Provider  Bismuth Tribromoph-Petrolatum (XEROFORM PETROLAT GAUZE 1"X8" EX) Apply topically. Apply mid chest once daily every three days   Yes Historical Provider, MD  collagenase (SANTYL) ointment Apply 1 application topically daily. Right heel, right lateral knee, and right shin   Yes Historical Provider, MD  divalproex (DEPAKOTE SPRINKLE) 125 MG capsule Take 250 mg by mouth 2 (two) times daily. For anxiety disorder Give 3 capsule qhs   Yes Historical Provider, MD  HYDROcodone-acetaminophen (NORCO/VICODIN) 5-325 MG tablet Take one tablet by mouth every 6 hours for pain. Not to exceed  of APAP from all sources /24hr 11/04/15  Yes Tiffany L Reed, DO  LORazepam (ATIVAN) 0.5 MG tablet Take 0.25 mg by mouth every 8 (eight) hours.   Yes Historical Provider, MD  Multiple Vitamins-Minerals (DECUBI-VITE PO) Take 2 capsules by mouth daily.   Yes Historical Provider, MD  Nutritional Supplements (NUTRITIONAL SUPPLEMENT PO) Take 90 mLs by mouth 3 (three) times daily. 2-cal   Yes Historical Provider, MD  nystatin (MYCOSTATIN/NYSTOP) 100000 UNIT/GM POWD Apply topically as needed (PRN redness under breast).   Yes Historical Provider, MD  Polyethyl Glycol-Propyl Glycol 0.4-0.3 % SOLN Apply 1 drop to eye 2 (two) times daily. Both eyes   Yes Historical Provider, MD  risperiDONE (RISPERDAL) 0.25 MG tablet Take 1 tablet (0.25 mg total) by mouth at bedtime. Patient taking differently: Take 0.25 mg by mouth at bedtime.  Give one by mouth in the morning for anxiety. 09/19/14  Yes Sharee Holstereborah S Green, NP   BP 97/61 mmHg  Pulse 92  Temp(Src) 98.7 F (37.1 C) (Oral)  Resp 16  SpO2 98%   Physical Exam  Constitutional: She appears well-developed and well-nourished.  HENT:  Head: Normocephalic and atraumatic.  Eyes: Conjunctivae and EOM are normal. Pupils are equal, round, and reactive to light.  No conjunctival pallor  Neck: Normal range of motion. Neck supple. No JVD present.   Cardiovascular: Normal rate, regular rhythm and normal heart sounds.   No murmur heard. Pulmonary/Chest: Effort normal and breath sounds normal. She has no wheezes. She has no rales. She exhibits no tenderness.  Abdominal: Soft. Bowel sounds are normal. She exhibits no distension and no mass. There is no tenderness.  Genitourinary:  External skin tag present. She has a large amount of soft, brown stool. Stool is hemoccult negative.   Musculoskeletal: Normal range of motion. She exhibits no edema.  Lymphadenopathy:    She has no cervical adenopathy.  Neurological: She is alert. No cranial nerve deficit. She exhibits normal muscle tone. Coordination normal.  Oriented to person, but not place or time.  Skin: Skin is warm and dry. No rash noted.  Nursing note and vitals reviewed.  ED Course  Procedures (including critical care time)  DIAGNOSTIC STUDIES: Oxygen Saturation is 98% on RA, normal by my interpretation.   Labs Review Results for orders placed or performed during the hospital encounter of 11/21/15  CBC with Differential  Result Value Ref Range   WBC 8.3 4.0 - 10.5 K/uL   RBC 3.57 (L) 3.87 - 5.11 MIL/uL   Hemoglobin 11.1 (L) 12.0 - 15.0 g/dL   HCT 82.933.6 (L) 56.236.0 - 13.046.0 %   MCV 94.1 78.0 - 100.0 fL   MCH 31.1 26.0 - 34.0 pg   MCHC 33.0 30.0 - 36.0 g/dL   RDW 86.515.1 78.411.5 - 69.615.5 %   Platelets 442 (H) 150 - 400 K/uL   Neutrophils Relative % 64 %   Neutro Abs 5.3 1.7 - 7.7 K/uL   Lymphocytes Relative 30 %   Lymphs Abs 2.5 0.7 - 4.0 K/uL   Monocytes Relative 5 %   Monocytes Absolute 0.5 0.1 - 1.0 K/uL   Eosinophils Relative 0 %   Eosinophils Absolute 0.0 0.0 - 0.7 K/uL   Basophils Relative 1 %   Basophils Absolute 0.0 0.0 - 0.1 K/uL  Comprehensive metabolic panel  Result Value Ref Range   Sodium 139 135 - 145 mmol/L   Potassium 3.1 (L) 3.5 - 5.1 mmol/L   Chloride 104 101 - 111 mmol/L   CO2 23 22 - 32 mmol/L   Glucose, Bld 102 (H) 65 - 99 mg/dL   BUN 12 6 - 20 mg/dL    Creatinine, Ser 2.950.40 (L) 0.44 - 1.00 mg/dL   Calcium 8.6 (L) 8.9 - 10.3 mg/dL   Total Protein 6.2 (L) 6.5 - 8.1 g/dL   Albumin 2.7 (L) 3.5 - 5.0 g/dL   AST 19 15 - 41 U/L   ALT 10 (L) 14 - 54 U/L   Alkaline Phosphatase 66 38 - 126 U/L   Total Bilirubin 1.5 (H) 0.3 - 1.2 mg/dL   GFR calc non Af Amer >60 >60 mL/min   GFR calc Af Amer >60 >60 mL/min   Anion gap 12 5 - 15  Hemoglobin and hematocrit, blood  Result Value Ref Range   Hemoglobin 10.4 (L) 12.0 - 15.0 g/dL  HCT 31.5 (L) 36.0 - 46.0 %  POC occult blood, ED Provider will collect  Result Value Ref Range   Fecal Occult Bld NEGATIVE NEGATIVE  I-Stat CG4 Lactic Acid, ED  Result Value Ref Range   Lactic Acid, Venous 0.91 0.5 - 1.9 mmol/L  Type and screen [facility]  Result Value Ref Range   ABO/RH(D) B POS    Antibody Screen NEG    Sample Expiration 11/24/2015     I have personally reviewed and evaluated these lab results as part of my medical decision-making.   MDM   Final diagnoses:  Non-intractable vomiting with nausea, unspecified vomiting type  Normochromic normocytic anemia  Hypokalemia    Vomiting with possible GI bleed. In the ED, she is noted to have blood pressure in the 90s. However, on review of old records, her last 2 on office visits also had blood pressure in the 90s. She is not tachycardic. She is given IV fluids and screening labs are obtained. Lactic acid is normal. Hemoglobin is at approximately baseline. She's had no further vomiting in the ED. She is noted to be mildly hypokalemic which is probably related to emesis. She is observed in the ED and repeat hemoglobin did show a slight drop in this is felt to be related to hydration. Blood pressure has come up to 116 systolic. There is no tachycardia. She is felt to be hemodynamically stable and is discharged to follow-up with PCP.  I personally performed the services described in this documentation, which was scribed in my presence. The  recorded information has been reviewed and is accurate.        Dione Boozeavid Amair Shrout, MD 11/21/15 [phone]

## 2015-11-21 NOTE — ED Notes (Signed)
[facility] called for transportation back to facility 

## 2015-11-21 NOTE — ED Notes (Signed)
Bed: [id number] Expected date:  Expected time:  Means of arrival:  Comments: EMS 75 yr upper gi bleed

## 2015-11-27 ENCOUNTER — Encounter: Payer: Self-pay | Admitting: Adult Health

## 2015-11-27 ENCOUNTER — Non-Acute Institutional Stay (SKILLED_NURSING_FACILITY): Payer: Medicare Other | Admitting: Adult Health

## 2015-11-27 DIAGNOSIS — K920 Hematemesis: Secondary | ICD-10-CM | POA: Diagnosis not present

## 2015-11-27 DIAGNOSIS — R110 Nausea: Secondary | ICD-10-CM

## 2015-11-27 NOTE — Progress Notes (Signed)
Patient ID: Brenda AlarFannie L Crane, female   DOB: 01/27/1941, 75 y.o.   MRN: [id number]   Location:   [facility] Room Number: 227-B Place of Service:  SNF (31)   CODE STATUS: DNR  Allergies  Allergen Reactions  . Demerol     Per MAR.    Marland Kitchen. Ampicillin     Per facility MAR.   Marland Kitchen. Aspirin     Per facility [facility].    Marland Kitchen. Codeine     Per MAR.    . Dilaudid [Hydromorphone Hcl]     Per MAR.   Marland Kitchen. Elavil [Amitriptyline]     Per MAR.   Marland Kitchen. Flexeril [Cyclobenzaprine Hcl]     Per MAR.    . [facility]sa[facility]     [facility]    . Other     COX2 inhibitors. [facility]    Marland Kitchen. Penicillins     Per MAR.      Chief Complaint  Patient presents with  . Acute Visit    HPI:  She was seen in the ED for hematemesis. She was given IVF. Her hgb was stable. The decision was made for no further workup. She is unable to participate in the hpi or ros. There are no reports of further vomiting present. There are no nursing concerns at this time.    Past Medical History  Diagnosis Date  . Hypertension   . Allergy   . Bipolar 1 disorder (HCC)   . Dementia   . GERD (gastroesophageal reflux disease)   . Hyponatremia   . Schizoaffective disorder   . Cerebrovascular accident (HCC)   . Anemia   . Thyroid disease   . 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 Anemia of chronic disease 03/14/2015    Past Surgical History  Procedure Laterality Date  . Appendectomy    . Fracture surgery      Social History   Social History  . Marital Status: Married    Spouse Name: N/A  . Number of Children: N/A  . Years of Education: N/A   Occupational History  . Not on file.   Social History Main Topics  . Smoking status: Former Smoker -- 1.00 packs/day    Types: Cigarettes  . Smokeless tobacco: Not on file  . Alcohol Use: No  . Drug Use: No  . Sexual Activity: Not on file   Other Topics Concern  . Not on file   Social History Narrative   History reviewed. No pertinent family  history.    VITAL SIGNS BP 121/68 mmHg  Pulse 75  Temp(Src) 97.3 F (36.3 C) (Oral)  Resp 18  Ht 5\' 2"  (1.575 m)  Wt 85 lb (38.556 kg)  BMI 15.54 kg/m2  SpO2 98%  Patient's Medications  New Prescriptions   No medications on file  Previous Medications   COLLAGENASE (SANTYL) OINTMENT    Apply 1 application topically daily. Right heel, right lateral knee, and right shin   DIVALPROEX (DEPAKOTE SPRINKLE) 125 MG CAPSULE    Take 125 mg by mouth daily. For anxiety disorder Give 3 capsule qhs   HYDROCODONE-ACETAMINOPHEN (NORCO/VICODIN) 5-325 MG TABLET    Take one tablet by mouth every 6 hours for pain. Not to exceed 3000mg  of APAP from all sources /24hr   LORAZEPAM (ATIVAN) 0.5 MG TABLET    Take 0.25 mg by mouth every 8 (eight) hours.   MULTIPLE VITAMINS-MINERALS (DECUBI-VITE PO)    Take 2 capsules by mouth daily. Reported on 11/27/2015  NUTRITIONAL SUPPLEMENTS (NUTRITIONAL SUPPLEMENT PO)    Take 90 mLs by mouth 3 (three) times daily. 2-cal   NYSTATIN (MYCOSTATIN/NYSTOP) 100000 UNIT/GM POWD    Apply topically as needed (PRN redness under breast).   ONDANSETRON (ZOFRAN) 4 MG TABLET    Take 4 mg by mouth every 6 (six) hours as needed for nausea or vomiting.   POLYETHYL GLYCOL-PROPYL GLYCOL 0.4-0.3 % SOLN    Apply 1 drop to eye 2 (two) times daily. Both eyes   RISPERIDONE (RISPERDAL) 0.25 MG TABLET    Take 1 tablet (0.25 mg total) by mouth at bedtime.  Modified Medications   No medications on file  Discontinued Medications   BISMUTH TRIBROMOPH-PETROLATUM (XEROFORM PETROLAT GAUZE 1"X8" EX)    Apply topically. Reported on 11/27/2015     SIGNIFICANT DIAGNOSTIC EXAMS  not a candidate for mammogram; dexa or colonoscopy  07-16-14: left forearm x-ray: no acute fracture  07-16-14: left humerus x-ray: no acute fracture    LABS REVIEWED:   06-05-14: depakote 32  11-16-14: wbc 8.4; hgb 11.9; hct 39.6; mcv 86.3; plt 316; glucose 94; bun 36; creat 0.3; k+3.2; na++134; liver normal albumin 2.9    11-21-15: wbc 8.3; hgb 11.1; hct 33.6; mcv 94.1 plt 442; glucose 102; bun 12; creat 0.40; k+ 3.1; na++ 139; total bili 1.5; albumin 2.7      Review of Systems Unable to perform ROS: dementia     Physical Exam Constitutional: No distress.  Neck: Neck supple. No JVD present. No thyromegaly present.  Cardiovascular: Normal rate, normal heart sounds and intact distal pulses.   Heart rate slightly irregular  Pedal pulses not palpable   Respiratory: Effort normal and breath sounds normal. No respiratory distress.  GI: Soft. Bowel sounds are normal. She exhibits no distension. There is no tenderness.  Musculoskeletal: She exhibits no edema.  Right upper extremity contracture especially in the wrist  Able to move left upper extremity Does not move lower extremities Has contracture in right lower extremity   Neurological: She is alert.  Skin: Skin is warm and dry. She is not diaphoretic. Stage III right knee 1.6 x 1.4 x 0.12 cm Right shin stage IV 1.4 x 1.2 x 0.13 cm  Right heel stage IV: 2.2 x 1.5 cm x 0.13 cm    ASSESSMENT/ PLAN:  1. FTT/weight loss: will continue her supplements per the facility protocol. Her current weight is 85 pounds with  148 pounds in may 2016. . Will not make changes and will monitor her status.   2. Chronic pain: has contractures on  right  wrist/hand. Will continue vicodin 5/325 mg every 6 hours routinely and will monitor there are no indications of pain present.   3. Bipolar disorder: will continue her depakote sprinkles 125 mg in the AM and 375 mg in the PM  and risperdal 0.25 mg nightly and  ativan 0.25 mg every 8 hours   4. Dementia: is end stage; is  not on medications; she is followed by hospice care; her weight is 85 pounds her status is end stage weight loss is an expected outcome at this stage of her disease process.   5. 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   Will monitor  6.  Hematemesis: no further emesis will monitor     Synthia Innocent NP [facility]  Contact [phone] Monday through Friday 8am- 5pm  After hours call [phone]

## 2015-12-06 ENCOUNTER — Other Ambulatory Visit: Payer: Self-pay | Admitting: *Deleted

## 2015-12-06 MED ORDER — HYDROCODONE-ACETAMINOPHEN 5-325 MG PO TABS: ORAL_TABLET | ORAL | Status: DC

## 2015-12-06 NOTE — Telephone Encounter (Signed)
[name]  

## 2015-12-13 DIAGNOSIS — K920 Hematemesis: Secondary | ICD-10-CM | POA: Insufficient documentation

## 2015-12-27 ENCOUNTER — Non-Acute Institutional Stay (SKILLED_NURSING_FACILITY): Payer: Medicare Other | Admitting: Adult Health

## 2015-12-27 ENCOUNTER — Encounter: Payer: Self-pay | Admitting: Adult Health

## 2015-12-27 DIAGNOSIS — L97919 Non-pressure chronic ulcer of unspecified part of right lower leg with unspecified severity: Secondary | ICD-10-CM

## 2015-12-27 DIAGNOSIS — L89313 Pressure ulcer of right buttock, stage 3: Secondary | ICD-10-CM | POA: Diagnosis not present

## 2015-12-27 LAB — POTASSIUM: Potassium: 3.6 mmol/L

## 2015-12-27 NOTE — Progress Notes (Signed)
Patient ID: [name], female   DOB: 10-05-1940, 75 y.o.   MRN: [id number]   Location:   [facility] Room Number: 227-B Place of Service:  SNF (31)   CODE STATUS: DNR  Allergies  Allergen Reactions  . Demerol     Per MAR.    Marland Kitchen Ampicillin     Per facility [facility].   Marland Kitchen Aspirin     Per facility [facility].    Marland Kitchen Codeine     Per MAR.    . Dilaudid [Hydromorphone Hcl]     Per MAR.   Marland Kitchen Elavil [Amitriptyline]     Per MAR.   Marland Kitchen Flexeril [Cyclobenzaprine Hcl]     Per MAR.    . Nsaids     Per facility [facility].    . Other     COX2 inhibitors. Per facility [facility].    Marland Kitchen Penicillins     Per MAR.      Chief Complaint  Patient presents with  . Acute Visit    Wound Management    HPI:  I have been asked to review her wounds for secondary signs of infection. She continues to be followed by hospice care. She is unable to participate in the hpi or ros. There are no reports of fever present. There are no indications of pain present.    Past Medical History:  Diagnosis Date  . Allergy   . Anemia   . Anemia of chronic disease 03/14/2015  . 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 Bipolar 1 disorder (HCC)   . Cerebrovascular accident (HCC)   . Dementia   . GERD (gastroesophageal reflux disease)   . Hypertension   . Hyponatremia   . Schizoaffective disorder   . Thyroid disease     Past Surgical History:  Procedure Laterality Date  . APPENDECTOMY    . FRACTURE SURGERY      Social History   Social History  . Marital status: Married    Spouse name: N/A  . Number of children: N/A  . Years of education: N/A   Occupational History  . Not on file.   Social History Main Topics  . Smoking status: Former Smoker    Packs/day: 1.00    Types: Cigarettes  . Smokeless tobacco: Not on file  . Alcohol use No  . Drug use: No  . Sexual activity: Not on file   Other Topics Concern  . Not on file   Social History Narrative  . No narrative on file   History  reviewed. No pertinent family history.    VITAL SIGNS BP (!) 148/97   Pulse 75   Temp 98.7 F (37.1 C) (Oral)   Resp 18   Ht  (1.575 m)   Wt 88 lb 6 oz (40.1 kg)   SpO2 95%   BMI 16.16 kg/m   Patient's Medications  New Prescriptions   No medications on file  Previous Medications   COLLAGENASE (SANTYL) OINTMENT    Apply 1 application topically daily. Right heel, right lateral knee, and right shin   DIVALPROEX (DEPAKOTE SPRINKLE) 125 MG CAPSULE    Take 125 mg by mouth daily. For anxiety disorder Give 3 capsule qhs   HYDROCODONE-ACETAMINOPHEN (NORCO/VICODIN) 5-325 MG TABLET    Take one tablet by mouth every 6 hours for pain. Not to exceed  of APAP from all sources /24hr   LORAZEPAM (ATIVAN) 0.5 MG TABLET    Take 0.25 mg by mouth every 8 (eight) hours.  MAGNESIUM HYDROXIDE (MILK OF MAGNESIA) 400 MG/5ML SUSPENSION    Take 30 mLs by mouth daily as needed for mild constipation.   MULTIPLE VITAMINS-MINERALS (DECUBI-VITE PO)    Take 2 capsules by mouth daily. Reported on 11/27/2015   NUTRITIONAL SUPPLEMENTS (NUTRITIONAL SUPPLEMENT PO)    Take 120 mLs by mouth 4 (four) times daily. 2-cal    NYSTATIN (MYCOSTATIN/NYSTOP) 100000 UNIT/GM POWD    Apply topically as needed (PRN redness under breast).   ONDANSETRON (ZOFRAN) 4 MG TABLET    Take 4 mg by mouth every 6 (six) hours as needed for nausea or vomiting.   POLYETHYL GLYCOL-PROPYL GLYCOL 0.4-0.3 % SOLN    Apply 1 drop to eye 2 (two) times daily. Both eyes   RISPERIDONE (RISPERDAL) 0.25 MG TABLET    Take 1 tablet (0.25 mg total) by mouth at bedtime.  Modified Medications   No medications on file  Discontinued Medications   No medications on file     SIGNIFICANT DIAGNOSTIC EXAMS  not a candidate for mammogram; dexa or colonoscopy  07-16-14: left forearm x-ray: no acute fracture  07-16-14: left humerus x-ray: no acute fracture    LABS REVIEWED:    11-21-15: wbc 8.3; hgb 11.1; hct 33.6; mcv 94.1 plt 442; glucose 102; bun 12;  creat 0.40; k+ 3.1; na++ 139; total bili 1.5; albumin 2.7      Review of Systems Unable to perform ROS: dementia     Physical Exam Constitutional: No distress.  Neck: Neck supple. No JVD present. No thyromegaly present.  Cardiovascular: Normal rate, normal heart sounds and intact distal pulses.   Heart rate slightly irregular  Pedal pulses not palpable   Respiratory: Effort normal and breath sounds normal. No respiratory distress.  GI: Soft. Bowel sounds are normal. She exhibits no distension. There is no tenderness.  Musculoskeletal: right lower extremity edema   Right upper extremity contracture especially in the wrist  Able to move left upper extremity Does not move lower extremities Has contracture in right lower extremity   Neurological: She is alert.  Skin: Skin is warm and dry. She is not diaphoretic. Stage III right knee 13 x 1.0 x 0.12 cm Right knee stage IV (X 2 sites) each: 1.0 x 0.8 x 0.11 Right heel stage IV: Stage III right buttock ulceration  ASSESSMENT/ PLAN:  1. Right lower extremity ulcerations: is followed by wound doctor will continue current treatment 2. Stage III right buttock ulceration: is followed by wound doctor and will continue current treatment  Wounds without secondary signs of infection warranting culture for group A strept   MD is aware of resident's narcotic use and is in agreement with current plan of care. We will attempt to wean resident as apropriate   Synthia Innocenteborah Everrett Lacasse NP [facility]  Contact [phone] Monday through Friday 8am- 5pm  After hours call [phone]

## 2016-01-07 ENCOUNTER — Encounter: Payer: Self-pay | Admitting: Internal Medicine

## 2016-01-07 ENCOUNTER — Non-Acute Institutional Stay (SKILLED_NURSING_FACILITY): Payer: Medicare Other | Admitting: Internal Medicine

## 2016-01-07 DIAGNOSIS — R627 Adult failure to thrive: Secondary | ICD-10-CM

## 2016-01-07 DIAGNOSIS — L089 Local infection of the skin and subcutaneous tissue, unspecified: Secondary | ICD-10-CM

## 2016-01-07 DIAGNOSIS — T148 Other injury of unspecified body region: Secondary | ICD-10-CM | POA: Diagnosis not present

## 2016-01-07 DIAGNOSIS — T148XXA Other injury of unspecified body region, initial encounter: Principal | ICD-10-CM

## 2016-01-07 DIAGNOSIS — L89313 Pressure ulcer of right buttock, stage 3: Secondary | ICD-10-CM | POA: Diagnosis not present

## 2016-01-07 DIAGNOSIS — F0390 Unspecified dementia without behavioral disturbance: Secondary | ICD-10-CM | POA: Diagnosis not present

## 2016-01-07 DIAGNOSIS — F250 Schizoaffective disorder, bipolar type: Secondary | ICD-10-CM

## 2016-01-07 NOTE — Progress Notes (Signed)
Patient ID: [name], female   DOB: 1941-05-08, 75 y.o.   MRN: [id number]    DATE: 01/07/16  Location:    [place] Room Number: 194 B Place of Service: SNF (31)   Extended Emergency Contact Information Primary Emergency Contact: Jone Baseman Address: [address]           [place], [place] [address] Johnnette Litter of [place] Phone: [phone] Mobile Phone: [phone] Relation: Son Secondary Emergency Contact: Agnes Lawrence Address: [address]          [place], [place] [address] Home Phone: [phone] Relation: None  Advanced Directive information Does patient have an advance directive?: Yes, Type of Advance Directive: Out of facility DNR (pink MOST or yellow form), Does patient want to make changes to advanced directive?: No - Patient declined HOSPICE PT Chief Complaint  Patient presents with  . Acute Visit    foul smell from sacral wound    HPI:  75 yo female long term resident seen today for infected sacral wound. Wound care noted foul smell emanating from sacral wound and a purulent d/c. No f/c. She is a poor historian due to dementia and psych d/o. She has poor po intake and has FTT. Albumin 2.7. She gets nutritional supplements.   Bipolar disorder/schizoaffective d/o - mood stable on depakote sprinkles 125 mg in the AM and 375 mg in the PM;  risperdal 0.25 mg nightly; ativan 0.25 mg every 8 hours  Stage III right buttock ulceration- followed by wound doctor  She has chronic pain and takes chronic pain meds  Past Medical History:  Diagnosis Date  . Allergy   . Anemia   . Anemia of chronic disease 03/14/2015  . 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 Bipolar 1 disorder ([place])   . Cerebrovascular accident ([place])   . Dementia   . GERD (gastroesophageal reflux disease)   . Hypertension   . Hyponatremia   . Schizoaffective disorder   . Thyroid disease     Past Surgical History:  Procedure Laterality Date  . APPENDECTOMY    .  FRACTURE SURGERY      Patient Care Team: Gildardo Cranker, DO as PCP - General (Internal Medicine) Gerlene Fee, NP as Nurse Practitioner (Nurse Practitioner) [facility] ([place]  Social History   Social History  . Marital status: Married    Spouse name: N/A  . Number of children: N/A  . Years of education: N/A   Occupational History  . Not on file.   Social History Main Topics  . Smoking status: Former Smoker    Packs/day: 1.00    Types: Cigarettes  . Smokeless tobacco: Never Used  . Alcohol use No  . Drug use: No  . Sexual activity: Not on file   Other Topics Concern  . Not on file   Social History Narrative  . No narrative on file     reports that she has quit smoking. Her smoking use included Cigarettes. She smoked 1.00 pack per day. She has never used smokeless tobacco. She reports that she does not drink alcohol or use drugs.  History reviewed. No pertinent family history. No family status information on file.    Immunization History  Administered Date(s) Administered  . Influenza Whole 02/27/2013  . Influenza-Unspecified 02/26/2014, 07/05/2015  . Pneumococcal-Unspecified 10/06/2011    Allergies  Allergen Reactions  . Demerol     Per MAR.    Marland Kitchen Ampicillin     Per  facility [facility].   Marland Kitchen Aspirin     Per facility [facility].    Marland Kitchen Codeine     Per MAR.    . Dilaudid [Hydromorphone Hcl]     Per MAR.   Marland Kitchen Elavil [Amitriptyline]     Per MAR.   Marland Kitchen Flexeril [Cyclobenzaprine Hcl]     Per MAR.    . Nsaids     Per facility [facility]    . Other     COX2 inhibitors. Per facility [facility].    Marland Kitchen Penicillins     Per MAR.      Medications: Patient's Medications  New Prescriptions   No medications on file  Previous Medications   COLLAGENASE (SANTYL) OINTMENT    Apply 1 application topically daily. Right heel, right lateral knee, and right shin   DIVALPROEX (DEPAKOTE SPRINKLE) 125 MG CAPSULE    Take 125 mg by mouth daily. For anxiety  disorder Give 3 capsule qhs   HYDROCODONE-ACETAMINOPHEN (NORCO/VICODIN) 5-325 MG TABLET    Take one tablet by mouth every 6 hours for pain. Not to exceed 3046m of APAP from all sources /24hr   LORAZEPAM (ATIVAN) 0.5 MG TABLET    Take 0.25 mg by mouth every 8 (eight) hours.   MAGNESIUM HYDROXIDE (MILK OF MAGNESIA) 400 MG/5ML SUSPENSION    Take 30 mLs by mouth daily as needed for mild constipation.   MULTIPLE VITAMINS-MINERALS (DECUBI-VITE PO)    Take 2 capsules by mouth daily. Reported on 11/27/2015   NUTRITIONAL SUPPLEMENTS (NUTRITIONAL SUPPLEMENT PO)    Take 120 mLs by mouth 4 (four) times daily. 2-cal    NYSTATIN (MYCOSTATIN/NYSTOP) 100000 UNIT/GM POWD    Apply topically as needed (PRN redness under breast).   ONDANSETRON (ZOFRAN) 4 MG TABLET    Take 4 mg by mouth every 6 (six) hours as needed for nausea or vomiting.   POLYETHYL GLYCOL-PROPYL GLYCOL 0.4-0.3 % SOLN    Apply 1 drop to eye 2 (two) times daily. Both eyes   RISPERIDONE (RISPERDAL) 0.25 MG TABLET    Take 1 tablet (0.25 mg total) by mouth at bedtime.  Modified Medications   No medications on file  Discontinued Medications   No medications on file    Review of Systems  Unable to perform ROS: Dementia    Vitals:   01/07/16 1540  BP: 129/70  Pulse: 81  Temp: 97.3 F (36.3 C)  TempSrc: Oral  SpO2: 99%   There is no height or weight on file to calculate BMI.  Physical Exam  Constitutional: She appears lethargic. She appears cachectic. Nasal cannula in place.  She is frail appearing, lying in bed on her side, [place] O2 intact  Musculoskeletal: She exhibits edema and tenderness.  Neurological: She appears lethargic.  Skin:  Foul smelling sacral wound with purulent d/c and area very TTP. Wound dsg soaked  Psychiatric: She has a normal mood and affect. Her behavior is normal.     Labs reviewed: Nursing Home on 12/27/2015  Component Date Value Ref Range Status  . Potassium 12/27/2015 3.6  mmol/L Final  Admission on  11/21/2015, Discharged on 11/21/2015  Component Date Value Ref Range Status  . WBC 11/21/2015 8.3  4.0 - 10.5 K/uL Final  . RBC 11/21/2015 3.57* 3.87 - 5.11 MIL/uL Final  . Hemoglobin 11/21/2015 11.1* 12.0 - 15.0 g/dL Final  . HCT 11/21/2015 33.6* 36.0 - 46.0 % Final  . MCV 11/21/2015 94.1  78.0 - 100.0 fL Final  . MCH 11/21/2015 31.1  26.0 -  34.0 pg Final  . MCHC 11/21/2015 33.0  30.0 - 36.0 g/dL Final  . RDW 11/21/2015 15.1  11.5 - 15.5 % Final  . Platelets 11/21/2015 442* 150 - 400 K/uL Final  . Neutrophils Relative % 11/21/2015 64  % Final  . Neutro Abs 11/21/2015 5.3  1.7 - 7.7 K/uL Final  . Lymphocytes Relative 11/21/2015 30  % Final  . Lymphs Abs 11/21/2015 2.5  0.7 - 4.0 K/uL Final  . Monocytes Relative 11/21/2015 5  % Final  . Monocytes Absolute 11/21/2015 0.5  0.1 - 1.0 K/uL Final  . Eosinophils Relative 11/21/2015 0  % Final  . Eosinophils Absolute 11/21/2015 0.0  0.0 - 0.7 K/uL Final  . Basophils Relative 11/21/2015 1  % Final  . Basophils Absolute 11/21/2015 0.0  0.0 - 0.1 K/uL Final  . Sodium 11/21/2015 139  135 - 145 mmol/L Final  . Potassium 11/21/2015 3.1* 3.5 - 5.1 mmol/L Final  . Chloride 11/21/2015 104  101 - 111 mmol/L Final  . CO2 11/21/2015 23  22 - 32 mmol/L Final  . Glucose, Bld 11/21/2015 102* 65 - 99 mg/dL Final  . BUN 11/21/2015 12  6 - 20 mg/dL Final  . Creatinine, Ser 11/21/2015 0.40* 0.44 - 1.00 mg/dL Final  . Calcium 11/21/2015 8.6* 8.9 - 10.3 mg/dL Final  . Total Protein 11/21/2015 6.2* 6.5 - 8.1 g/dL Final  . Albumin 11/21/2015 2.7* 3.5 - 5.0 g/dL Final  . AST 11/21/2015 19  15 - 41 U/L Final  . ALT 11/21/2015 10* 14 - 54 U/L Final  . Alkaline Phosphatase 11/21/2015 66  38 - 126 U/L Final  . Total Bilirubin 11/21/2015 1.5* 0.3 - 1.2 mg/dL Final  . GFR calc non Af Amer 11/21/2015 >60  >60 mL/min Final  . GFR calc Af Amer 11/21/2015 >60  >60 mL/min Final   Comment: (NOTE) The eGFR has been calculated using the CKD EPI equation. This calculation  has not been validated in all clinical situations. eGFR's persistently <60 mL/min signify possible Chronic Kidney Disease.   . Anion gap 11/21/2015 12  5 - 15 Final  . ABO/RH(D) 11/21/2015 B POS   Final  . Antibody Screen 11/21/2015 NEG   Final  . Sample Expiration 11/21/2015 11/24/2015   Final  . Fecal Occult Bld 11/21/2015 NEGATIVE  NEGATIVE Final  . Lactic Acid, Venous 11/21/2015 0.91  0.5 - 1.9 mmol/L Final  . ABO/RH(D) 11/21/2015 B POS   Final  . Hemoglobin 11/21/2015 10.4* 12.0 - 15.0 g/dL Final  . HCT 11/21/2015 31.5* 36.0 - 46.0 % Final    No results found.   Assessment/Plan   ICD-9-CM ICD-10-CM   1. Infected wound ([place]) 958.3 T14.8     L08.9    sacral  2. Stage III pressure ulcer of right buttock (HCC) 707.05 L89.313    707.23    3. FTT (failure to thrive) in adult 783.7 R62.7   4. Dementia without behavioral disturbance 294.20 F03.90   5. Schizoaffective disorder, bipolar type ([place]) 295.70 F25.0    Wound cx and s of sacral wound  Start flagyl 541m crushed and placed in wound bed every 3 days. Apply duoderm until cx resulted  Cont other meds as ordered  Wound care as ordered  Cont nutritional supplements as ordered  Will follow   Myanna Ziesmer S. CPerlie Gold PSloan [facility] [address] [phone] (Monday-Friday 8 AM - 5  PM) [phone] After 5 PM and follow prompts

## 2016-01-14 DIAGNOSIS — L89313 Pressure ulcer of right buttock, stage 3: Secondary | ICD-10-CM | POA: Insufficient documentation

## 2016-01-17 ENCOUNTER — Encounter: Payer: Self-pay | Admitting: Internal Medicine

## 2016-01-17 ENCOUNTER — Non-Acute Institutional Stay (SKILLED_NURSING_FACILITY): Payer: Medicare Other | Admitting: Internal Medicine

## 2016-01-17 DIAGNOSIS — R627 Adult failure to thrive: Secondary | ICD-10-CM

## 2016-01-17 DIAGNOSIS — Z8673 Personal history of transient ischemic attack (TIA), and cerebral infarction without residual deficits: Secondary | ICD-10-CM | POA: Diagnosis not present

## 2016-01-17 DIAGNOSIS — F0390 Unspecified dementia without behavioral disturbance: Secondary | ICD-10-CM

## 2016-01-17 DIAGNOSIS — G8929 Other chronic pain: Secondary | ICD-10-CM

## 2016-01-17 NOTE — Progress Notes (Signed)
Location:   [facility] Room Number: 227/B Place of Service:  SNF (31) Provider:  Aundra MilletArlo Meerab Maselli  Carter, Monica, DO  Patient Care Team: Kirt BoysMonica Carter, DO as PCP - General (Internal Medicine) Sharee Holstereborah S Green, NP as Nurse Practitioner (Nurse Practitioner) [facility]  Extended Emergency Contact Information Primary Emergency Contact: Sandria SenterBrewer,James & Lisa Address: [address]           [place], [place] [address] [place] Home Phone: [phone] Mobile Phone: [phone] Relation: Son Secondary Emergency Contact: Marilu FavreBARNES,RAYMOND Address: [address]          [place], [place] [address] Home Phone: [phone] Relation: None  Code Status:  MOST/DNR Goals of care: Advanced Directive information Advanced Directives 01/17/2016  Does patient have an advance directive? Yes  Type of Advance Directive Out of facility DNR (pink MOST or yellow form)  Does patient want to make changes to advanced directive? No - Patient declined  Copy of advanced directive(s) in chart? Yes  Would patient like information on creating an advanced directive? -     Chief Complaint  Patient presents with  . Medical Management of Chronic Issues    Routine Visit  Medical management of chronic medical conditions including end-stage dementia-bipolar disorder-chronic pain-failure to thrive  HPI:  Pt is a 75 y.o. female seen today for medical management of chronic diseases as noted above.  She does have a history of a sacral wound this is followed by the wound care physician -she is on Cipro for a Morganella morganii positive culture  Is also sent to the emergency room on 11/27/2015 with bloody vomiting-she did receive IV fluids her hemoglobin was stable and further workup was declined-this has not recurred to my knowledge appears to be stable in this regards.  Vital signs appear to be stable she does continue to lose weight has lost about 8 pounds in the  past 2 months-this is consistent with her end-stage dementia again comfort is the priority and apparently she is stable in this regards.  She does have a history of chronic contractures and does receive Vicodin 5-3 25 mg every 6 hours for pain which apparently is helping.  In regards to her wounds she does also have right lower extremity wounds that are followed by wound care as well.  .  .     Past Medical History:  Diagnosis Date  . Allergy   . Anemia   . Anemia of chronic disease 03/14/2015  . 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 Bipolar 1 disorder (HCC)   . Cerebrovascular accident (HCC)   . Dementia   . GERD (gastroesophageal reflux disease)   . Hypertension   . Hyponatremia   . Schizoaffective disorder   . Thyroid disease    Past Surgical History:  Procedure Laterality Date  . APPENDECTOMY    . FRACTURE SURGERY      Allergies  Allergen Reactions  . Demerol     Per MAR.    Marland Kitchen. Ampicillin     Per facility [facility].   Marland Kitchen. Aspirin     Per facility [facility].    Marland Kitchen. Codeine     Per MAR.    . Dilaudid [Hydromorphone Hcl]     Per MAR.   Marland Kitchen. Elavil [Amitriptyline]     Per MAR.   Marland Kitchen. Flexeril [Cyclobenzaprine Hcl]     Per MAR.    . Nsaids     [facility]    .Marland Kitchen  Other     COX2 inhibitors. Per facility [facility].    Marland Kitchen Penicillins     Per MAR.        Medication List       Accurate as of 01/17/16  2:08 PM. Always use your most recent med list.          ciprofloxacin 250 MG tablet Commonly known as:  CIPRO Take one tablet by mouth two times a day for 10 days ( STOP DATE 01/23/16)   collagenase ointment Commonly known as:  SANTYL Apply 1 application topically daily. Right heel, right lateral knee, and right shin   DECUBI-VITE PO Take 2 capsules by mouth daily. Reported on 11/27/2015   divalproex 125 MG capsule Commonly known as:  DEPAKOTE SPRINKLE Take 125 mg by mouth daily. For anxiety disorder Give 3 capsule qhs   HYDROcodone-acetaminophen 5-325 MG  tablet Commonly known as:  NORCO/VICODIN Take one tablet by mouth every 6 hours for pain. Not to exceed 3000mg  of APAP from all sources /24hr   LORazepam 0.5 MG tablet Commonly known as:  ATIVAN Take 0.25 mg by mouth every 8 (eight) hours.   MILK OF MAGNESIA 400 MG/5ML suspension Generic drug:  magnesium hydroxide Take 30 mLs by mouth daily as needed for mild constipation.   NUTRITIONAL SUPPLEMENT PO Take 120 mLs by mouth 4 (four) times daily. 2-cal   nystatin powder Generic drug:  nystatin Apply topically as needed (PRN redness under breast).   ondansetron 4 MG tablet Commonly known as:  ZOFRAN Take 4 mg by mouth every 6 (six) hours as needed for nausea or vomiting.   Polyethyl Glycol-Propyl Glycol 0.4-0.3 % Soln Place 1 drop into both eyes 2 (two) times daily. Both eyes   PROBIOTIC DAILY PO Give 1 tablet by mouth two times a day for antibiotic use for 21 days   risperiDONE 0.25 MG tablet Commonly known as:  RISPERDAL Take 0.25 mg by mouth daily.   risperiDONE 0.5 MG tablet Commonly known as:  RISPERDAL Take 0.5 mg by mouth at bedtime.       Review of Systems   Not obtainable secondary severe dementia please see history of present illness  Immunization History  Administered Date(s) Administered  . Influenza Whole 02/27/2013  . Influenza-Unspecified 02/26/2014, 07/05/2015  . Pneumococcal-Unspecified 10/06/2011   Pertinent  Health Maintenance Due  Topic Date Due  . INFLUENZA VACCINE  12/17/2015  . MAMMOGRAM  05/25/2016  . COLONOSCOPY  05/25/2024  . DEXA SCAN  Addressed  . PNA vac Low Risk Adult  Addressed   No flowsheet data found. Functional Status Survey:    Vitals:   01/17/16 1347  BP: (!) 102/58  Pulse: 74  Resp: 18  Temp: 97.7 F (36.5 C)  TempSrc: Oral  SpO2: 98%  Weight: 77 lb (34.9 kg)  Height: 5\' 2"  (1.575 m)   Body mass index is 14.08 kg/m. Physical Exam   Constitutional: No distress. Very frail elderly female with numerous  contractures  .  Cardiovascular: Normal rate, normal heart sounds  Heart rate slightly irregular -sounds are somewhat distant  Pedal pulses not palpable   Respiratory: Effort normal and breath sounds normal. No respiratory distress.  GI: Soft. Bowel sounds are normal. She exhibits no distension. There is no tenderness.  Musculoskeletal: She exhibits minimal edema. Of the right foot  Right upper extremity contracture especially in the wrist  Able to move left upper extremity Does not move lower extremities Has contracture in right lower extremity  Neurological: She is alert. Findings consistent with severe dementia Skin: Skin is warm and dry. She is not diaphoretic. Has right lower extremity wounds and sacral wound which are followed by wound care and currently covered   Labs reviewed:  Recent Labs  11/21/15 0138 11/26/15  NA 139  --   K 3.1* 3.6  CL 104  --   CO2 23  --   GLUCOSE 102*  --   BUN 12  --   CREATININE 0.40*  --   CALCIUM 8.6*  --     Recent Labs  11/21/15 0138  AST 19  ALT 10*  ALKPHOS 66  BILITOT 1.5*  PROT 6.2*  ALBUMIN 2.7*    Recent Labs  11/21/15 0138 11/21/15 0525  WBC 8.3  --   NEUTROABS 5.3  --   HGB 11.1* 10.4*  HCT 33.6* 31.5*  MCV 94.1  --   PLT 442*  --     No results found for: HGBA1C No results found for: CHOL, HDL, LDLCALC, LDLDIRECT, TRIG, CHOLHDL  Significant Diagnostic Results in last 30 days:  No results found.  Assessment/Plan  1. FTT/weight loss: will continue her supplements per the facility protocol. Her current weight is 77 pounds with  148 pounds in may 2016. . Will not make changes and will monitor her status.   2. Chronic pain: has contractures on  right  wrist/hand. Will continue vicodin 5/325 mg every 6 hours routinely and will monitor there are no indications of pain present.   3. Bipolar disorder: will continue her depakote sprinkles 125 mg in the AM and 375 mg in the PM  and risperdal 0.25 mg during the  day and 0.5 mg daily at bedtime and  ativan 0.25 mg every 8 hours   4. Dementia: is end stage; is  not on medications; she is followed by hospice care; her weight is 77 pounds her status is end stage weight loss is an expected outcome at this stage of her disease process.   5. 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   Will monitor  6. Hematemesis: no further emesis will monitor   #7 history of numerous wounds as noted above this is followed by wound care physician and wound care nursing facility she is on Cipro for Morganella Morgagni infection sacral wound  [id number]

## 2016-02-10 ENCOUNTER — Other Ambulatory Visit: Payer: Self-pay

## 2016-02-10 MED ORDER — HYDROCODONE-ACETAMINOPHEN 5-325 MG PO TABS: ORAL_TABLET | ORAL | 0 refills | Status: DC

## 2016-02-10 NOTE — Telephone Encounter (Signed)
RX faxed to [facility] @ [phone], phone number [phone] 

## 2016-02-26 ENCOUNTER — Non-Acute Institutional Stay (SKILLED_NURSING_FACILITY): Payer: Medicare Other | Admitting: Adult Health

## 2016-02-26 ENCOUNTER — Encounter: Payer: Self-pay | Admitting: Adult Health

## 2016-02-26 DIAGNOSIS — F0150 Vascular dementia without behavioral disturbance: Secondary | ICD-10-CM | POA: Diagnosis not present

## 2016-02-26 DIAGNOSIS — F319 Bipolar disorder, unspecified: Secondary | ICD-10-CM | POA: Diagnosis not present

## 2016-02-26 DIAGNOSIS — I638 Other cerebral infarction: Secondary | ICD-10-CM | POA: Diagnosis not present

## 2016-02-26 DIAGNOSIS — I69354 Hemiplegia and hemiparesis following cerebral infarction affecting left non-dominant side: Secondary | ICD-10-CM | POA: Diagnosis not present

## 2016-02-26 DIAGNOSIS — R627 Adult failure to thrive: Secondary | ICD-10-CM

## 2016-02-26 DIAGNOSIS — L97919 Non-pressure chronic ulcer of unspecified part of right lower leg with unspecified severity: Secondary | ICD-10-CM | POA: Diagnosis not present

## 2016-02-26 DIAGNOSIS — G894 Chronic pain syndrome: Secondary | ICD-10-CM | POA: Diagnosis not present

## 2016-02-26 DIAGNOSIS — I6389 Other cerebral infarction: Secondary | ICD-10-CM

## 2016-02-26 DIAGNOSIS — R634 Abnormal weight loss: Secondary | ICD-10-CM | POA: Diagnosis not present

## 2016-02-26 NOTE — Progress Notes (Signed)
Patient ID: [name], female   DOB: [birth year], 75 y.o.   MRN: [id number]    Location:   [facility] Room Number: 227-B Place of Service:  Home (12)   CODE STATUS: DNR  Allergies  Allergen Reactions  . Demerol     Per MAR.    Marland Kitchen Ampicillin     Per facility [facility].   Marland Kitchen Aspirin     Per facility [facility].    Marland Kitchen Codeine     Per MAR.    . Dilaudid [Hydromorphone Hcl]     Per MAR.   Marland Kitchen Elavil [Amitriptyline]     Per MAR.   Marland Kitchen Flexeril [Cyclobenzaprine Hcl]     Per MAR.    . Nsaids     Per facility [facility].    . Other     COX2 inhibitors. Per facility [facility]    Marland Kitchen Penicillins     Per MAR.      Chief Complaint  Patient presents with  . Medical Management of Chronic Issues    Follow up    HPI:  She is a long term resident of this facility being seen for the management of her chronic illnesses. She continues to be followed by hospice care. She continues to decline her skin continues to break down. She is unable to participate in the hpi or ros. There are no nursing concerns at this time.    Past Medical History:  Diagnosis Date  . Allergy   . Anemia   . Anemia of chronic disease 03/14/2015  . 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 Bipolar 1 disorder (HCC)   . Cerebrovascular accident (HCC)   . Dementia   . GERD (gastroesophageal reflux disease)   . Hypertension   . Hyponatremia   . Schizoaffective disorder   . Thyroid disease     Past Surgical History:  Procedure Laterality Date  . APPENDECTOMY    . FRACTURE SURGERY      Social History   Social History  . Marital status: Married    Spouse name: N/A  . Number of children: N/A  . Years of education: N/A   Occupational History  . Not on file.   Social History Main Topics  . Smoking status: Former Smoker    Packs/day: 1.00    Types: Cigarettes  . Smokeless tobacco: Never Used  . Alcohol use No  . Drug use: No  . Sexual activity: Not on file   Other Topics Concern  . Not on file    Social History Narrative  . No narrative on file   History reviewed. No pertinent family history.    VITAL SIGNS BP 124/73   Pulse 96   Temp 98 F (36.7 C) (Oral)   Resp 16   Ht 5\' 2"  (1.575 m)   Wt 76 lb 3 oz (34.6 kg)   SpO2 96%   BMI 13.93 kg/m   Patient's Medications  New Prescriptions   No medications on file  Previous Medications   COLLAGENASE (SANTYL) OINTMENT    Apply 1 application topically daily. Right heel, right lateral knee, and right shin   DIVALPROEX (DEPAKOTE SPRINKLE) 125 MG CAPSULE    Take 125 mg by mouth daily. For anxiety disorder Give 3 capsule qhs   HYDROCODONE-ACETAMINOPHEN (NORCO/VICODIN) 5-325 MG TABLET    Take one tablet by mouth every 6 hours for pain. Not to exceed 3000mg  of APAP from all sources /24hr   LORAZEPAM (ATIVAN) 0.5 MG TABLET  Take 0.25 mg by mouth every 8 (eight) hours.   MAGNESIUM HYDROXIDE (MILK OF MAGNESIA) 400 MG/5ML SUSPENSION    Take 30 mLs by mouth daily as needed for mild constipation.   MORPHINE (ROXANOL) 20 MG/ML CONCENTRATED SOLUTION    Take 0.25 mg by mouth daily.   MULTIPLE VITAMINS-MINERALS (DECUBI-VITE PO)    Take 2 capsules by mouth daily. Reported on 11/27/2015   NUTRITIONAL SUPPLEMENTS (NUTRITIONAL SUPPLEMENT PO)    Take 120 mLs by mouth 4 (four) times daily. 2-cal    NYSTATIN (MYCOSTATIN/NYSTOP) 100000 UNIT/GM POWD    Apply topically as needed (PRN redness under breast).   ONDANSETRON (ZOFRAN) 4 MG TABLET    Take 4 mg by mouth every 6 (six) hours as needed for nausea or vomiting.   POLYETHYL GLYCOL-PROPYL GLYCOL 0.4-0.3 % SOLN    Place 1 drop into both eyes 2 (two) times daily. Both eyes    RISPERIDONE (RISPERDAL) 0.25 MG TABLET    Take 0.25 mg by mouth daily.   RISPERIDONE (RISPERDAL) 0.5 MG TABLET    Take 0.5 mg by mouth at bedtime.  Modified Medications   No medications on file  Discontinued Medications   CIPROFLOXACIN (CIPRO) 250 MG TABLET    Take one tablet by mouth two times a day for 10 days ( STOP DATE  01/23/16)   PROBIOTIC PRODUCT (PROBIOTIC DAILY PO)    Give 1 tablet by mouth two times a day for antibiotic use for 21 days     SIGNIFICANT DIAGNOSTIC EXAMS  not a candidate for mammogram; dexa or colonoscopy  07-16-14: left forearm x-ray: no acute fracture  07-16-14: left humerus x-ray: no acute fracture    LABS REVIEWED:   11-21-15: wbc 8.3; hgb 11.1; hct 33.6; mcv 94.1 plt 442; glucose 102; bun 12; creat 0.40; k+ 3.1; na++ 139; total bili 1.5; albumin 2.7  01-16-16: right foot culture: providencia stuartii: cipro     Review of Systems Unable to perform ROS: dementia     Physical Exam Constitutional: No distress.  Neck: Neck supple. No JVD present. No thyromegaly present.  Cardiovascular: Normal rate, normal heart sounds and intact distal pulses.   Heart rate slightly irregular  Pedal pulses not palpable   Respiratory: Effort normal and breath sounds normal. No respiratory distress.  GI: Soft. Bowel sounds are normal. She exhibits no distension. There is no tenderness.  Musculoskeletal: right lower extremity edema   Right upper extremity contracture especially in the wrist  Able to move left upper extremity Does not move lower extremities Has contracture in right lower extremity   Neurological: She is alert.  Skin: Skin is warm and dry. She is not diaphoretic. Left lateral foot: stage III: 1.2 x 1.0 x 0.12 cm santyl Right dorsal lateral foot:stage  IV: 1.3 x 1.6 x 0.1 cm Right upper lateral shin: stage IV: 1.0 x 0.8 x 0.11 Right heel stage IV: 1.0 x 1.0 x 0.11 cm Sacrum: stage IV: 7.4 x 6.2 x 0.14cm   ASSESSMENT/ PLAN:   1. FTT/weight loss: will continue her supplements per the facility protocol. Her current weight is 76 pounds with  148 pounds in may 2016. . Will not make changes and will monitor her status.   2. Chronic pain: has contractures on  right  wrist/hand. Will continue vicodin 5/325 mg every 6 hours routinely and roxanol 5 mg daily and will monitor there are  no indications of pain present.   3. Bipolar disorder: will continue her depakote sprinkles375 mg in the  PM  and risperdal 0.25 mg in the AM and 0.5 mf nightly and  ativan 0.25 mg every 8 hours   4. Dementia: is end stage; is  not on medications; she is followed by hospice care; her weight is 76  pounds her status is end stage weight loss is an expected outcome at this stage of her disease process.   5. 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   Will monitor  6. 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 Unfortunately as she continues to lose weight; skin break down is an expected outcome.    MD is aware of resident's narcotic use and is in agreement with current plan of care. We will attempt to wean resident as appropriate.     Synthia Innocent NP [facility]  Contact [phone] Monday through Friday 8am- 5pm  After hours call [phone]

## 2016-03-31 ENCOUNTER — Encounter: Payer: Self-pay | Admitting: Adult Health

## 2016-03-31 ENCOUNTER — Non-Acute Institutional Stay (SKILLED_NURSING_FACILITY): Payer: Medicare Other | Admitting: Adult Health

## 2016-03-31 DIAGNOSIS — G894 Chronic pain syndrome: Secondary | ICD-10-CM | POA: Diagnosis not present

## 2016-03-31 DIAGNOSIS — F319 Bipolar disorder, unspecified: Secondary | ICD-10-CM | POA: Diagnosis not present

## 2016-03-31 DIAGNOSIS — I69354 Hemiplegia and hemiparesis following cerebral infarction affecting left non-dominant side: Secondary | ICD-10-CM | POA: Diagnosis not present

## 2016-03-31 DIAGNOSIS — F0150 Vascular dementia without behavioral disturbance: Secondary | ICD-10-CM | POA: Diagnosis not present

## 2016-03-31 DIAGNOSIS — L89313 Pressure ulcer of right buttock, stage 3: Secondary | ICD-10-CM | POA: Diagnosis not present

## 2016-03-31 DIAGNOSIS — R627 Adult failure to thrive: Secondary | ICD-10-CM | POA: Diagnosis not present

## 2016-03-31 DIAGNOSIS — I638 Other cerebral infarction: Secondary | ICD-10-CM | POA: Diagnosis not present

## 2016-03-31 DIAGNOSIS — I6389 Other cerebral infarction: Secondary | ICD-10-CM

## 2016-03-31 NOTE — Progress Notes (Signed)
Patient ID: [name], female   DOB: 09/29/1940, 75 y.o.   MRN: [id number]   Location:   [facility] Room Number: 227-B Place of Service:  SNF (31)   CODE STATUS: DNR  Allergies  Allergen Reactions  . Demerol     Per MAR.    Marland Kitchen. Ampicillin     Per facility [facility].   Marland Kitchen. Aspirin     Per facility [facility].    Marland Kitchen. Codeine     Per MAR.    . Dilaudid [Hydromorphone Hcl]     Per MAR.   Marland Kitchen. Elavil [Amitriptyline]     Per MAR.   Marland Kitchen. Flexeril [Cyclobenzaprine Hcl]     Per [facility].    . [facility]     [facility]    . Other     [facility]    Marland Kitchen. Penicillins     Per [facility].      Chief Complaint  Patient presents with  . Medical Management of Chronic Issues    Follow up    HPI:  She is a long term resident of this facility being seen for the management of her chronic illnesses. She continues to be followed by hospice care. She continues to decline she has skin breakdown and weight loss. She is unable to participate in the hpi or ros. There are no nursing concerns at this time.    Past Medical History:  Diagnosis Date  . Allergy   . Anemia   . Anemia of chronic disease 03/14/2015  . 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 Bipolar 1 disorder (HCC)   . Cerebrovascular accident (HCC)   . Dementia   . GERD (gastroesophageal reflux disease)   . Hypertension   . Hyponatremia   . Schizoaffective disorder   . Thyroid disease     Past Surgical History:  Procedure Laterality Date  . APPENDECTOMY    . FRACTURE SURGERY      Social History   Social History  . Marital status: Married    Spouse name: N/A  . Number of children: N/A  . Years of education: N/A   Occupational History  . Not on file.   Social History Main Topics  . Smoking status: Former Smoker    Packs/day: 1.00    Types: Cigarettes  . Smokeless tobacco: Never Used  . Alcohol use No  . Drug use: No  . Sexual activity: Not on file   Other Topics Concern  . Not on file    Social History Narrative  . No narrative on file   History reviewed. No pertinent family history.    VITAL SIGNS BP 120/62   Pulse 88   Temp 97.2 F (36.2 C) (Oral)   Resp 17   Ht 5\' 2"  (1.575 m)   Wt 71 lb (32.2 kg)   SpO2 97%   BMI 12.99 kg/m   Patient's Medications  New Prescriptions   No medications on file  Previous Medications   COLLAGENASE (SANTYL) OINTMENT    Apply 1 application topically daily. Right heel, right lateral knee, and right shin   DIVALPROEX (DEPAKOTE SPRINKLE) 125 MG CAPSULE    Take 125 mg by mouth daily. For anxiety disorder Give 3 capsule qhs   HYDROCODONE-ACETAMINOPHEN (NORCO/VICODIN) 5-325 MG TABLET    Take one tablet by mouth every 6 hours for pain. Not to exceed 3000mg  of APAP from all sources /24hr   LORAZEPAM (ATIVAN) 0.5 MG TABLET  Take 0.25 mg by mouth every 8 (eight) hours.   MAGNESIUM HYDROXIDE (MILK OF MAGNESIA) 400 MG/5ML SUSPENSION    Take 30 mLs by mouth daily as needed for mild constipation.   MORPHINE (ROXANOL) 20 MG/ML CONCENTRATED SOLUTION    Take 0.25 mg by mouth daily.   MULTIPLE VITAMINS-MINERALS (DECUBI-VITE PO)    Take 2 capsules by mouth daily. Reported on 11/27/2015   NUTRITIONAL SUPPLEMENTS (NUTRITIONAL SUPPLEMENT PO)    Take 120 mLs by mouth 4 (four) times daily. 2-cal    NYSTATIN (MYCOSTATIN/NYSTOP) 100000 UNIT/GM POWD    Apply topically as needed (PRN redness under breast).   ONDANSETRON (ZOFRAN) 4 MG TABLET    Take 4 mg by mouth every 6 (six) hours as needed for nausea or vomiting.   POLYETHYL GLYCOL-PROPYL GLYCOL 0.4-0.3 % SOLN    Place 1 drop into both eyes 2 (two) times daily. Both eyes    RISPERIDONE (RISPERDAL) 0.25 MG TABLET    Take 0.25 mg by mouth daily.   RISPERIDONE (RISPERDAL) 0.5 MG TABLET    Take 0.5 mg by mouth at bedtime.  Modified Medications   No medications on file  Discontinued Medications   No medications on file     SIGNIFICANT DIAGNOSTIC EXAMS   not a candidate for mammogram; dexa or  colonoscopy  07-16-14: left forearm x-ray: no acute fracture  07-16-14: left humerus x-ray: no acute fracture    LABS REVIEWED:   11-21-15: wbc 8.3; hgb 11.1; hct 33.6; mcv 94.1 plt 442; glucose 102; bun 12; creat 0.40; k+ 3.1; na++ 139; total bili 1.5; albumin 2.7  01-16-16: right foot culture: providencia stuartii: cipro     Review of Systems Unable to perform ROS: dementia     Physical Exam Constitutional: No distress.  Neck: Neck supple. No JVD present. No thyromegaly present.  Cardiovascular: Normal rate, normal heart sounds and intact distal pulses.   Heart rate slightly irregular  Pedal pulses not palpable   Respiratory: Effort normal and breath sounds normal. No respiratory distress.  GI: Soft. Bowel sounds are normal. She exhibits no distension. There is no tenderness.  Musculoskeletal: right lower extremity edema   Right upper extremity contracture especially in the wrist  Able to move left upper extremity Does not move lower extremities Has contracture in right lower extremity   Neurological: She is alert.  Skin: Skin is warm and dry. She is not diaphoretic. Left lateral foot: stage III: 1.2 x 1.0 x 0.12 cm santyl Right dorsal lateral foot:stage  IV: 1.3 x 1.6 x 0.1 cm Right upper lateral shin: stage IV: 1.0 x 0.8 x 0.11 Right heel stage IV: 1.0 x 1.0 x 0.11 cm Sacrum: stage IV: 7.2 x 5.8 x 0.14cm   ASSESSMENT/ PLAN:   1. FTT/weight loss: will continue her supplements per the facility protocol. Her current weight is 76 pounds with  148 pounds in may 2016. . Will not make changes and will monitor her status.   2. Chronic pain: has contractures on  right  wrist/hand. Will continue vicodin 5/325 mg every 6 hours routinely and roxanol 5 mg daily and will monitor there are no indications of pain present.   3. Bipolar disorder: will continue her depakote sprinkles 125 mg in the AM 375 mg in the PM  and risperdal 0.25 mg in the AM and 0.5 mf nightly and  ativan 0.25 mg  every 8 hours   4. Dementia: is end stage; is  not on medications; she is followed by hospice  care; her weight is 71  pounds her status is end stage weight loss is an expected outcome at this stage of her disease process.   5. 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   Will monitor  6. 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 Unfortunately as she continues to lose weight; and continues to decline her skin will continue to breakdown.    MD is aware of resident's narcotic use and is in agreement with current plan of care. We will attempt to wean resident as appropriate.     Synthia Innocenteborah Naudia Crosley NP [facility]  Contact [phone] Monday through Friday 8am- 5pm  After hours call [phone]

## 2016-05-01 ENCOUNTER — Encounter: Payer: Self-pay | Admitting: Adult Health

## 2016-05-01 ENCOUNTER — Non-Acute Institutional Stay (SKILLED_NURSING_FACILITY): Payer: Medicare Other | Admitting: Adult Health

## 2016-05-01 DIAGNOSIS — F0150 Vascular dementia without behavioral disturbance: Secondary | ICD-10-CM

## 2016-05-01 DIAGNOSIS — I69354 Hemiplegia and hemiparesis following cerebral infarction affecting left non-dominant side: Secondary | ICD-10-CM | POA: Diagnosis not present

## 2016-05-01 DIAGNOSIS — F319 Bipolar disorder, unspecified: Secondary | ICD-10-CM

## 2016-05-01 DIAGNOSIS — G894 Chronic pain syndrome: Secondary | ICD-10-CM

## 2016-05-01 DIAGNOSIS — L97919 Non-pressure chronic ulcer of unspecified part of right lower leg with unspecified severity: Secondary | ICD-10-CM | POA: Diagnosis not present

## 2016-05-01 DIAGNOSIS — R627 Adult failure to thrive: Secondary | ICD-10-CM

## 2016-05-01 DIAGNOSIS — I638 Other cerebral infarction: Secondary | ICD-10-CM

## 2016-05-01 DIAGNOSIS — I6389 Other cerebral infarction: Secondary | ICD-10-CM

## 2016-05-01 NOTE — Progress Notes (Signed)
Patient ID: [name], female   DOB: 02/28/1941, 75 y.o.   MRN: [id number]   Location:   [facility] Room Number: 227-B Place of Service:  SNF (31)   CODE STATUS: DNR  Allergies  Allergen Reactions  . Demerol     Per MAR.    Marland Kitchen Ampicillin     Per facility [facility].   Marland Kitchen Aspirin     Per facility [facility].    Marland Kitchen Codeine     Per MAR.    . Dilaudid [Hydromorphone Hcl]     Per MAR.   Marland Kitchen Elavil [Amitriptyline]     Per MAR.   Marland Kitchen Flexeril [Cyclobenzaprine Hcl]     Per MAR.    . Nsaids     Per facility [facility].    . Other     COX2 inhibitors. Per facility [facility].    Marland Kitchen Penicillins     Per MAR.      Chief Complaint  Patient presents with  . Medical Management of Chronic Issues    Follow up    HPI:  She is a long term resident of this facility being seen for the management of her chronic illnesses. She continues to be followed by hospice care. She continues to slowly decline; her skin is declining. She is unable to participate in the hpi or ros. There are no nursing concerns at this time.    Past Medical History:  Diagnosis Date  . Allergy   . Anemia   . Anemia of chronic disease 03/14/2015  . 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 Bipolar 1 disorder (HCC)   . Cerebrovascular accident (HCC)   . Dementia   . GERD (gastroesophageal reflux disease)   . Hypertension   . Hyponatremia   . Schizoaffective disorder   . Thyroid disease     Past Surgical History:  Procedure Laterality Date  . APPENDECTOMY    . FRACTURE SURGERY      Social History   Social History  . Marital status: Married    Spouse name: N/A  . Number of children: N/A  . Years of education: N/A   Occupational History  . Not on file.   Social History Main Topics  . Smoking status: Former Smoker    Packs/day: 1.00    Types: Cigarettes  . Smokeless tobacco: Never Used  . Alcohol use No  . Drug use: No  . Sexual activity: Not on file   Other Topics Concern  . Not on file    Social History Narrative  . No narrative on file   History reviewed. No pertinent family history.    VITAL SIGNS BP 124/68   Pulse 70   Temp 97.9 F (36.6 C) (Oral)   Resp 18   Ht 5\' 2"  (1.575 m)   Wt 69 lb (31.3 kg)   SpO2 96%   BMI 12.62 kg/m   Patient's Medications  New Prescriptions   No medications on file  Previous Medications   COLLAGENASE (SANTYL) OINTMENT    Apply 1 application topically daily. Right heel, right lateral knee, and right shin   DIVALPROEX (DEPAKOTE SPRINKLE) 125 MG CAPSULE    Take 125 mg by mouth daily. For anxiety disorder Give 3 capsule qhs   HYDROCODONE-ACETAMINOPHEN (NORCO/VICODIN) 5-325 MG TABLET    Take one tablet by mouth every 6 hours for pain. Not to exceed 3000mg  of APAP from all sources /24hr   LORAZEPAM (ATIVAN) 0.5 MG TABLET    Take 0.25  mg by mouth every 8 (eight) hours.   MAGNESIUM HYDROXIDE (MILK OF MAGNESIA) 400 MG/5ML SUSPENSION    Take 30 mLs by mouth daily as needed for mild constipation.   MORPHINE (ROXANOL) 20 MG/ML CONCENTRATED SOLUTION    Take 0.25 mg by mouth daily.   MULTIPLE VITAMINS-MINERALS (DECUBI-VITE PO)    Take 2 capsules by mouth daily. Reported on 11/27/2015   NUTRITIONAL SUPPLEMENTS (NUTRITIONAL SUPPLEMENT PO)    Take 120 mLs by mouth 4 (four) times daily. 2-cal    NYSTATIN (MYCOSTATIN/NYSTOP) 100000 UNIT/GM POWD    Apply topically as needed (PRN redness under breast).   ONDANSETRON (ZOFRAN) 4 MG TABLET    Take 4 mg by mouth every 6 (six) hours as needed for nausea or vomiting.   POLYETHYL GLYCOL-PROPYL GLYCOL 0.4-0.3 % SOLN    Place 1 drop into both eyes 2 (two) times daily. Both eyes    RISPERIDONE (RISPERDAL) 0.25 MG TABLET    Take 0.25 mg by mouth daily.   RISPERIDONE (RISPERDAL) 0.5 MG TABLET    Take 0.5 mg by mouth at bedtime.  Modified Medications   No medications on file  Discontinued Medications   No medications on file     SIGNIFICANT DIAGNOSTIC EXAMS  not a candidate for mammogram; dexa or  colonoscopy  07-16-14: left forearm x-ray: no acute fracture  07-16-14: left humerus x-ray: no acute fracture    LABS REVIEWED:   11-21-15: wbc 8.3; hgb 11.1; hct 33.6; mcv 94.1 plt 442; glucose 102; bun 12; creat 0.40; k+ 3.1; na++ 139; total bili 1.5; albumin 2.7  01-16-16: right foot culture: providencia stuartii: cipro     Review of Systems Unable to perform ROS: dementia     Physical Exam Constitutional: No distress.  Neck: Neck supple. No JVD present. No thyromegaly present.  Cardiovascular: Normal rate, normal heart sounds and intact distal pulses.   Heart rate slightly irregular  Pedal pulses not palpable   Respiratory: Effort normal and breath sounds normal. No respiratory distress.  GI: Soft. Bowel sounds are normal. She exhibits no distension. There is no tenderness.  Musculoskeletal: right lower extremity edema   Right upper extremity contracture especially in the wrist  Able to move left upper extremity Does not move lower extremities Has contracture in right lower extremity   Neurological: She is alert.  Skin: Skin is warm and dry. She is not diaphoretic. Right heel stage IV: 1.5 x 1.5 x 0.11 cm Sacrum: stage IV: 6.5 x 5.1 x 0.12cm Left lateral foot stage III: 1.3 x 1.1 x 0.11cm Right posterior hip stage IV: 2.2 x 1.5 x 0.14cm Right plantar foot: stage IV: 2.2 x 2.0 x 0.11cm     ASSESSMENT/ PLAN:  1. FTT/weight loss: will continue her supplements per the facility protocol. Her current weight is 69 pounds with  148 pounds in may 2016. . Will not make changes and will monitor her status.   2. Chronic pain: has contractures on  right  wrist/hand. Will continue vicodin 5/325 mg every 6 hours routinely and roxanol 5 mg daily and will monitor there are no indications of pain present.   3. Bipolar disorder: will continue her depakote sprinkles 125 mg in the AM 375 mg in the PM  and risperdal 0.25 mg in the AM and 0.5 mf nightly and  ativan 0.25 mg every 8 hours    4. Dementia: is end stage; is  not on medications; she is followed by hospice care; her weight is 69  pounds  her status is end stage weight loss is an expected outcome at this stage of her disease process.   5. 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   Will monitor  6. 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 Unfortunately as she continues to lose weight; and continues to decline her skin will continue to breakdown.           MD is aware of resident's narcotic use and is in agreement with current plan of care. We will attempt to wean resident as apropriate   Synthia Innocenteborah Doniqua Saxby NP [facility]  Contact [phone] Monday through Friday 8am- 5pm  After hours call [phone]

## 2016-05-27 ENCOUNTER — Non-Acute Institutional Stay (SKILLED_NURSING_FACILITY): Payer: Medicare Other | Admitting: Adult Health

## 2016-05-27 DIAGNOSIS — R634 Abnormal weight loss: Secondary | ICD-10-CM

## 2016-05-27 DIAGNOSIS — L8994 Pressure ulcer of unspecified site, stage 4: Secondary | ICD-10-CM | POA: Diagnosis not present

## 2016-05-27 DIAGNOSIS — R627 Adult failure to thrive: Secondary | ICD-10-CM | POA: Diagnosis not present

## 2016-06-16 DIAGNOSIS — L8994 Pressure ulcer of unspecified site, stage 4: Secondary | ICD-10-CM | POA: Insufficient documentation

## 2016-06-16 NOTE — Progress Notes (Signed)
Location:   [facility]    Place of Service:  SNF (31)   CODE STATUS: dnr  Allergies  Allergen Reactions  . Demerol     Per MAR.    Marland Kitchen. Ampicillin     Per facility [facility].   Marland Kitchen. Aspirin     Per facility [facility].    Marland Kitchen. Codeine     Per MAR.    . Dilaudid [Hydromorphone Hcl]     Per MAR.   Marland Kitchen. Elavil [Amitriptyline]     Per MAR.   Marland Kitchen. Flexeril [Cyclobenzaprine Hcl]     Per MAR.    . [facility]     [facility]    . Other     [facility]. [facility]    Marland Kitchen. Penicillins     Per [facility].      Chief Complaint  Patient presents with  . 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 She continues to slowly lose weight; her nutritional status is very poor. These are a result of her end stage dementia. She continues to be followed by hospice care.   Past Medical History:  Diagnosis Date  . Allergy   . Anemia   . Anemia of chronic disease 03/14/2015  . 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 Bipolar 1 disorder (HCC)   . Cerebrovascular accident (HCC)   . Dementia   . GERD (gastroesophageal reflux disease)   . Hypertension   . Hyponatremia   . Schizoaffective disorder   . Thyroid disease     Past Surgical History:  Procedure Laterality Date  . APPENDECTOMY    . FRACTURE SURGERY      Social History   Social History  . Marital status: Married    Spouse name: N/A  . Number of children: N/A  . Years of education: N/A   Occupational History  . Not on file.   Social History Main Topics  . Smoking status: Former Smoker    Packs/day: 1.00    Types: Cigarettes  . Smokeless tobacco: Never Used  . Alcohol use No  . Drug use: No  . Sexual activity: Not on file   Other Topics Concern  . Not on file   Social History Narrative  . No narrative on file   No family history on file.    VITAL SIGNS BP (!) 110/56   Pulse 72   Resp 16   Ht 5\' 2"  (1.575 m)   Wt 69  lb (31.3 kg)   SpO2 95%   BMI 12.62 kg/m   Patient's Medications  New Prescriptions   No medications on file  Previous Medications   COLLAGENASE (SANTYL) OINTMENT    Apply 1 application topically daily. Right heel, right lateral knee, and right shin   DIVALPROEX (DEPAKOTE SPRINKLE) 125 MG CAPSULE    Take 125 mg by mouth daily. For anxiety disorder Give 3 capsule qhs   HYDROCODONE-ACETAMINOPHEN (NORCO/VICODIN) 5-325 MG TABLET    Take one tablet by mouth every 6 hours for pain. Not to exceed 3000mg  of APAP from all sources /24hr   LORAZEPAM (ATIVAN) 0.5 MG TABLET    Take 0.25 mg by mouth every 8 (eight) hours.   MAGNESIUM HYDROXIDE (MILK OF MAGNESIA) 400 MG/5ML SUSPENSION    Take 30 mLs by mouth daily as needed for mild constipation.   MORPHINE (ROXANOL) 20 MG/ML CONCENTRATED SOLUTION  Take 0.25 mg by mouth daily.   MULTIPLE VITAMINS-MINERALS (DECUBI-VITE PO)    Take 2 capsules by mouth daily. Reported on 11/27/2015   NUTRITIONAL SUPPLEMENTS (NUTRITIONAL SUPPLEMENT PO)    Take 120 mLs by mouth 4 (four) times daily. 2-cal    NYSTATIN (MYCOSTATIN/NYSTOP) 100000 UNIT/GM POWD    Apply topically as needed (PRN redness under breast).   ONDANSETRON (ZOFRAN) 4 MG TABLET    Take 4 mg by mouth every 6 (six) hours as needed for nausea or vomiting.   POLYETHYL GLYCOL-PROPYL GLYCOL 0.4-0.3 % SOLN    Place 1 drop into both eyes 2 (two) times daily. Both eyes    RISPERIDONE (RISPERDAL) 0.25 MG TABLET    Take 0.25 mg by mouth daily.   RISPERIDONE (RISPERDAL) 0.5 MG TABLET    Take 0.5 mg by mouth at bedtime.  Modified Medications   No medications on file  Discontinued Medications   No medications on file     SIGNIFICANT DIAGNOSTIC EXAMS   not a candidate for mammogram; dexa or colonoscopy  07-16-14: left forearm x-ray: no acute fracture  07-16-14: left humerus x-ray: no acute fracture    LABS REVIEWED:   11-21-15: wbc 8.3; hgb 11.1; hct 33.6; mcv 94.1 plt 442; glucose 102; bun 12; creat 0.40; k+  3.1; na++ 139; total bili 1.5; albumin 2.7  01-16-16: right foot culture: providencia stuartii: cipro     Review of Systems Unable to perform ROS: dementia     Physical Exam Constitutional: No distress.  Neck: Neck supple. No JVD present. No thyromegaly present.  Cardiovascular: Normal rate, normal heart sounds and intact distal pulses.   Heart rate slightly irregular  Pedal pulses not palpable   Respiratory: Effort normal and breath sounds normal. No respiratory distress.  GI: Soft. Bowel sounds are normal. She exhibits no distension. There is no tenderness.  Musculoskeletal: right lower extremity edema   Right upper extremity contracture especially in the wrist  Able to move left upper extremity Does not move lower extremities Has contracture in right lower extremity   Neurological: She is alert.  Skin: Skin is warm and dry. She is not diaphoretic. Right heel stage IV: 1.5 x 1.5 x 0.11 cm Sacrum: stage IV: 6.2 x 5.1 x 0.12cm Left lateral foot stage III: 1.3 x 1.1 x 0.11cm Right posterior hip stage IV: 2.2 x 1.5 x 0.14cm Right plantar foot: stage IV: 2.2 x 2.0 x 0.11cm     ASSESSMENT/ PLAN:  1. FTT/weight loss: will continue her supplements per the facility protocol. Her current weight is 69 pounds with  148 pounds in may 2016. . Will not make changes and will monitor her status.   2. 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 Unfortunately as she continues to lose weight; and continues to decline her skin will continue to breakdown.     MD is aware of resident's narcotic use and is in agreement with current plan of care. We will attempt to wean resident as apropriate   Synthia Innocent NP [facility]  Contact [phone] Monday through Friday 8am- 5pm  After hours call [phone]

## 2016-06-23 ENCOUNTER — Non-Acute Institutional Stay (SKILLED_NURSING_FACILITY): Payer: Medicare Other | Admitting: Internal Medicine

## 2016-06-23 DIAGNOSIS — F319 Bipolar disorder, unspecified: Secondary | ICD-10-CM

## 2016-06-23 DIAGNOSIS — F0150 Vascular dementia without behavioral disturbance: Secondary | ICD-10-CM

## 2016-06-23 DIAGNOSIS — I638 Other cerebral infarction: Secondary | ICD-10-CM

## 2016-06-23 DIAGNOSIS — R634 Abnormal weight loss: Secondary | ICD-10-CM

## 2016-06-23 DIAGNOSIS — I6389 Other cerebral infarction: Secondary | ICD-10-CM

## 2016-06-23 NOTE — Progress Notes (Signed)
This is a routine visit.  Level care skilled.  Facility is [facility]   This is a routine visit.  Chief complaint-medical management of chronic medical conditions including end-stage dementia-chronic pain-bipolar disorder-late effect CVA-history of multiple stage IV ulcerations.  History of present illness.  Patient is a long-term resident of this facility seen for management of chronic illnesses as noted above.  She is followed by hospice.  She continues a gradual decline with end-stage dementia and skin declining.  She cannot really give any history of present illness or review of systems.  Her wounds according the staff are somewhat stable although again significant.   Most recent weight is 66.6 pounds which appears to be down a couple pounds from previous weight decline is expected with her condition     Past Medical History:  Diagnosis Date  . Allergy   . Anemia   . Anemia of chronic disease 03/14/2015  . 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 Bipolar 1 disorder (HCC)   . Cerebrovascular accident (HCC)   . Dementia   . GERD (gastroesophageal reflux disease)   . Hypertension   . Hyponatremia   . Schizoaffective disorder   . Thyroid disease     Past Surgical History:  Procedure Laterality Date  . APPENDECTOMY    . FRACTURE SURGERY      Social History        Social History  . Marital status: Married    Spouse name: N/A  . Number of children: N/A  . Years of education: N/A      Occupational History  . Not on file.        Social History Main Topics  . Smoking status: Former Smoker    Packs/day: 1.00    Types: Cigarettes  . Smokeless tobacco: Never Used  . Alcohol use No  . Drug use: No  . Sexual activity: Not on file       Other Topics Concern  . Not on file      Social History Narrative  . No narrative on file   History reviewed. No pertinent family  history.    VITAL SIGNS She is afebrile pulse is 80 respirations 18 blood pressure 96/43      Patient's Medications  New Prescriptions   No medications on file  Previous Medications   COLLAGENASE (SANTYL) OINTMENT    Apply 1 application topically daily. Right heel, right lateral knee, and right shin   DIVALPROEX (DEPAKOTE SPRINKLE) 125 MG CAPSULE    Take 125 mg by mouth daily. For anxiety disorder Give 3 capsule qhs   HYDROCODONE-ACETAMINOPHEN (NORCO/VICODIN) 5-325 MG TABLET    Take one tablet by mouth every 6 hours for pain. Not to exceed 3000mg  of APAP from all sources /24hr   LORAZEPAM (ATIVAN) 0.5 MG TABLET    Take 0.25 mg by mouth every 8 (eight) hours.   MAGNESIUM HYDROXIDE (MILK OF MAGNESIA) 400 MG/5ML SUSPENSION    Take 30 mLs by mouth daily as needed for mild constipation.   MORPHINE (ROXANOL) 20 MG/ML CONCENTRATED SOLUTION    Take 0.25 mg by mouth daily.   MULTIPLE VITAMINS-MINERALS (DECUBI-VITE PO)    Take 2 capsules by mouth daily. Reported on 11/27/2015   NUTRITIONAL SUPPLEMENTS (NUTRITIONAL SUPPLEMENT PO)    Take 120 mLs by mouth 4 (four) times daily. 2-cal    NYSTATIN (MYCOSTATIN/NYSTOP) 100000 UNIT/GM POWD    Apply topically as needed (PRN redness under breast).   ONDANSETRON (ZOFRAN) 4 MG  TABLET    Take 4 mg by mouth every 6 (six) hours as needed for nausea or vomiting.   POLYETHYL GLYCOL-PROPYL GLYCOL 0.4-0.3 % SOLN    Place 1 drop into both eyes 2 (two) times daily. Both eyes    RISPERIDONE (RISPERDAL) 0.25 MG TABLET    Take 0.25 mg by mouth daily.   RISPERIDONE (RISPERDAL) 0.5 MG TABLET    Take 0.5 mg by mouth at bedtime.  Modified Medications   No medications on file  Discontinued Medications   No medications on file     SIGNIFICANT DIAGNOSTIC EXAMS  not a candidate for mammogram; dexa or colonoscopy  07-16-14: left forearm x-ray: no acute fracture  07-16-14: left humerus x-ray: no acute fracture    LABS REVIEWED:   11-21-15:  wbc 8.3; hgb 11.1; hct 33.6; mcv 94.1 plt 442; glucose 102; bun 12; creat 0.40; k+ 3.1; na++ 139; total bili 1.5; albumin 2.7  01-16-16: right foot culture: providencia stuartii: cipro     Review of Systems Unable to perform ROS: dementia     Physical Exam Constitutional: No distresVery frail-appearing lying in bed she appears to be comfortable s.  Neck: Neck supple. No JVD present. No thyromegaly present.  Cardiovascular: Normal rate, normal heart sounds and intact distal pulses.    Respiratory: Effort normal and breath sounds normal. No respiratory distress.  GI: Soft. Bowel sounds are normal. She exhibits no distension. There is no tenderness.  Musculoskeletal: right lower extremity edema   Right upper extremity contracture especially in the wrist   is holding her left upper extremity in a contracted position  Does not move lower extremities  and holds in contracted position bilaterally   Neurological: She is alert.  Skin: Skin is warm and dry. She is not diaphoretic. Her wounds are currently covered-per nursing staff Right heel stage IV: 1.5 x 1.5 x 0.11 cm Sacrum: stage IV: 6.5 x 5.1 x 0.12cm Left lateral foot stage III: 1.3 x 1.1 x 0.11cm Right posterior hip stage IV: 2.2 x 1.5 x 0.14cm Right plantar foot: stage IV: 2.2 x 2.0 x 0.11cm     ASSESSMENT/ PLAN:  1. FTT/weight loss: will continue her supplements per the facility protocol. Her current weight is  pounds  66.6   with  148 pounds in may 2016. . Will not make changes and will monitor her status.   2. Chronic pain: has diffuse contractures Will continue vicodin 5/325 mg every 6 hours routinely and roxanol 5 mg daily and will monitor there are no indications of pain currently     3. Bipolar disorder: will continue her depakote sprinkles 125 mg in the AM 375 mg in the PM  and risperdal 0.25 mg in the AM and 0.5 mf nightly and  ativan 0.25 mg every 8 hours   4. Dementia: is end stage; is  not on medications;  she is followed by hospice care; her weight is66.6   pounds her status is end stage weight loss is an expected outcome at this stage of her disease process.   5. 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   Will monitor  6. 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 Unfortunately as she continues to lose weight; and continues to decline her skin will continue to breakdown.   [id number]

## 2016-07-22 ENCOUNTER — Non-Acute Institutional Stay (SKILLED_NURSING_FACILITY): Payer: Medicare Other | Admitting: Adult Health

## 2016-07-22 ENCOUNTER — Encounter: Payer: Self-pay | Admitting: Adult Health

## 2016-07-22 DIAGNOSIS — G894 Chronic pain syndrome: Secondary | ICD-10-CM

## 2016-07-22 DIAGNOSIS — R627 Adult failure to thrive: Secondary | ICD-10-CM

## 2016-07-22 DIAGNOSIS — F0150 Vascular dementia without behavioral disturbance: Secondary | ICD-10-CM

## 2016-07-22 DIAGNOSIS — F319 Bipolar disorder, unspecified: Secondary | ICD-10-CM

## 2016-07-22 DIAGNOSIS — I69354 Hemiplegia and hemiparesis following cerebral infarction affecting left non-dominant side: Secondary | ICD-10-CM | POA: Diagnosis not present

## 2016-07-22 DIAGNOSIS — I638 Other cerebral infarction: Secondary | ICD-10-CM | POA: Diagnosis not present

## 2016-07-22 DIAGNOSIS — I6389 Other cerebral infarction: Secondary | ICD-10-CM

## 2016-07-22 NOTE — Progress Notes (Signed)
Location:   [facility] Room Number: 227 B Place of Service:  SNF (31)   CODE STATUS:  DNR  Allergies  Allergen Reactions  . Demerol     Per MAR.    Marland Kitchen. Ampicillin     Per facility [facility].   Marland Kitchen. Aspirin     Per facility [facility].    Marland Kitchen. Codeine     Per MAR.    . Dilaudid [Hydromorphone Hcl]     Per MAR.   Marland Kitchen. Elavil [Amitriptyline]     Per MAR.   Marland Kitchen. Flexeril [Cyclobenzaprine Hcl]     Per MAR.    . [facility]saids     [facility]    . Other     COX2 inhibitors. [facility]    Marland Kitchen. Penicillins     Per MAR.    . Tuberculin Tests     Chief Complaint  Patient presents with  . Medical Management of Chronic Issues    Routine Visit    HPI:  She is a long term resident of this facility being seen for the management of her chronic illnesses. She continues to be followed by hospice care. She is unable to participate in the hpi or ros. There are no nursing concerns at this time.   Past Medical History:  Diagnosis Date  . Allergy   . Anemia   . Anemia of chronic disease 03/14/2015  . 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 Bipolar 1 disorder (HCC)   . Cerebrovascular accident (HCC)   . Dementia   . GERD (gastroesophageal reflux disease)   . Hypertension   . Hyponatremia   . Schizoaffective disorder   . Thyroid disease     Past Surgical History:  Procedure Laterality Date  . APPENDECTOMY    . FRACTURE SURGERY      Social History   Social History  . Marital status: Married    Spouse name: N/A  . Number of children: N/A  . Years of education: N/A   Occupational History  . Not on file.   Social History Main Topics  . Smoking status: Former Smoker    Packs/day: 1.00    Types: Cigarettes  . Smokeless tobacco: Never Used  . Alcohol use No  . Drug use: No  . Sexual activity: Not on file   Other Topics Concern  . Not on file   Social History Narrative  . No narrative on file   History reviewed. No pertinent family history.    VITAL  SIGNS BP (!) 118/56   Pulse 77   Temp (!) 96.5 F (35.8 C)   Resp 18   Ht 5\' 2"  (1.575 m)   SpO2 96%   Patient's Medications  New Prescriptions   No medications on file  Previous Medications   DIVALPROEX (DEPAKOTE SPRINKLE) 125 MG CAPSULE    Take 125 mg by mouth daily. For anxiety disorder Give 3 capsule qhs   HYDROCODONE-ACETAMINOPHEN (NORCO/VICODIN) 5-325 MG TABLET    Take one tablet by mouth every 6 hours for pain. Not to exceed 3000mg  of APAP from all sources /24hr   LORAZEPAM (ATIVAN) 0.5 MG TABLET    Take 0.25 mg by mouth every 8 (eight) hours.   MAGNESIUM HYDROXIDE (MILK OF MAGNESIA) 400 MG/5ML SUSPENSION    Take 30 mLs by mouth daily as needed for mild constipation.   MORPHINE (ROXANOL) 20 MG/ML CONCENTRATED SOLUTION    Take 0.25 mg by mouth daily.   MULTIPLE VITAMINS-MINERALS (DECUBI-VITE  PO)    Take 2 capsules by mouth daily. Reported on 11/27/2015   NUTRITIONAL SUPPLEMENTS (NUTRITIONAL SUPPLEMENT PO)    Take 120 mLs by mouth 4 (four) times daily. 2-cal    NYSTATIN (MYCOSTATIN/NYSTOP) 100000 UNIT/GM POWD    Apply topically as needed (PRN redness under breast).   ONDANSETRON (ZOFRAN) 4 MG TABLET    Take 4 mg by mouth every 6 (six) hours as needed for nausea or vomiting.   POLYETHYL GLYCOL-PROPYL GLYCOL 0.4-0.3 % SOLN    Place 1 drop into both eyes 2 (two) times daily. Both eyes    RISPERIDONE (RISPERDAL) 0.25 MG TABLET    Take 0.25 mg by mouth daily.   RISPERIDONE (RISPERDAL) 0.5 MG TABLET    Take 0.5 mg by mouth at bedtime.  Modified Medications   No medications on file  Discontinued Medications   COLLAGENASE (SANTYL) OINTMENT    Apply 1 application topically daily. Right heel, right lateral knee, and right shin     SIGNIFICANT DIAGNOSTIC EXAMS  not a candidate for mammogram; dexa or colonoscopy  07-16-14: left forearm x-ray: no acute fracture  07-16-14: left humerus x-ray: no acute fracture    LABS REVIEWED:   11-21-15: wbc 8.3; hgb 11.1; hct 33.6; mcv 94.1 plt 442;  glucose 102; bun 12; creat 0.40; k+ 3.1; na++ 139; total bili 1.5; albumin 2.7  01-16-16: right foot culture: providencia stuartii: cipro     Review of Systems Unable to perform ROS: dementia     Physical Exam Constitutional: No distress.  Neck: Neck supple. No JVD present. No thyromegaly present.  Cardiovascular: Normal rate, normal heart sounds and intact distal pulses.   Heart rate slightly irregular  Pedal pulses not palpable   Respiratory: Effort normal and breath sounds normal. No respiratory distress.  GI: Soft. Bowel sounds are normal. She exhibits no distension. There is no tenderness.  Musculoskeletal: right lower extremity edema   Right upper extremity contracture especially in the wrist  Able to move left upper extremity Does not move lower extremities Has contracture in right lower extremity   Neurological: She is alert.  Skin: Skin is warm and dry. She is not diaphoretic. Sacrum stage IV: 7.7 x 2.0 x 0.5 cm Right posterior hip: stage IV: 1.1 x 1.2 x 1.3 cm   ASSESSMENT/ PLAN:    1. FTT/weight loss: will continue her supplements per the facility protocol. Her current weight is 69 pounds with  148 pounds in may 2016. . Will not make changes and will monitor her status.   2. Chronic pain: has contractures on  right  wrist/hand. Will continue vicodin 5/325 mg every 6 hours routinely and roxanol 5 mg daily and will monitor there are no indications of pain present.   3. Bipolar disorder: will continue her depakote sprinkles 125 mg in the AM 375 mg in the PM  and risperdal 0.25 mg in the AM and 0.5 mg nightly and  ativan 0.25 mg every 8 hours   4. Dementia: is end stage; is  not on medications; she is followed by hospice care; her weight is 69  pounds her status is end stage weight loss is an expected outcome at this stage of her disease process.   5. 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    6. Multiple  stage IV ulcerations: will continue current treatment. Unfortunately as she continues to lose weight; and continues to decline her skin will continue to breakdown.  Will continue treatment per wound doctor     MD is aware of resident's narcotic use and is in agreement with current plan of care. We will attempt to wean resident as apropriate     Synthia Innocent NP [facility]  Contact [phone] Monday through Friday 8am- 5pm  After hours call [phone]

## 2016-07-31 ENCOUNTER — Encounter: Payer: Self-pay | Admitting: Adult Health

## 2016-07-31 ENCOUNTER — Non-Acute Institutional Stay (SKILLED_NURSING_FACILITY): Payer: Medicare Other | Admitting: Adult Health

## 2016-07-31 DIAGNOSIS — B372 Candidiasis of skin and nail: Secondary | ICD-10-CM

## 2016-07-31 NOTE — Progress Notes (Signed)
Location:   [facility] Room Number: 227 B Place of Service:  SNF (31)   CODE STATUS: DNR  Allergies  Allergen Reactions  . Demerol     Per MAR.    Marland Kitchen Ampicillin     Per facility [facility].   Marland Kitchen Aspirin     Per facility [facility].    Marland Kitchen Codeine     Per MAR.    . Dilaudid [Hydromorphone Hcl]     Per MAR.   Marland Kitchen Elavil [Amitriptyline]     Per MAR.   Marland Kitchen Flexeril [Cyclobenzaprine Hcl]     Per MAR.    . Nsaids     Per facility [facility].    . Other     COX2 inhibitors. Per facility [facility].    Marland Kitchen Penicillins     Per MAR.    . Tuberculin Tests     Chief Complaint  Patient presents with  . Acute Visit    skin redness    HPI:  I have been asked to see her regarding her left arm contracture which has a yeast rash present. The contracture is severe and staff is unable to clean the area. She is unable to participate in the hpi or ros. There are no reports of fever present.    Past Medical History:  Diagnosis Date  . Allergy   . Anemia   . Anemia of chronic disease 03/14/2015  . 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 Bipolar 1 disorder (HCC)   . Cerebrovascular accident (HCC)   . Dementia   . GERD (gastroesophageal reflux disease)   . Hypertension   . Hyponatremia   . Schizoaffective disorder   . Thyroid disease     Past Surgical History:  Procedure Laterality Date  . APPENDECTOMY    . FRACTURE SURGERY      Social History   Social History  . Marital status: Married    Spouse name: N/A  . Number of children: N/A  . Years of education: N/A   Occupational History  . Not on file.   Social History Main Topics  . Smoking status: Former Smoker    Packs/day: 1.00    Types: Cigarettes  . Smokeless tobacco: Never Used  . Alcohol use No  . Drug use: No  . Sexual activity: Not on file   Other Topics Concern  . Not on file   Social History Narrative  . No narrative on file   History reviewed. No pertinent family history.    VITAL SIGNS BP 110/60    Pulse 81   Temp (!) 96.2 F (35.7 C)   Resp 18   Ht 5\' 2"  (1.575 m)   Wt 69 lb (31.3 kg)   SpO2 96%   BMI 12.62 kg/m   Patient's Medications  New Prescriptions   No medications on file  Previous Medications   DIVALPROEX (DEPAKOTE SPRINKLE) 125 MG CAPSULE    Take 125 mg by mouth daily. For anxiety disorder Give 3 capsule qhs   HYDROCODONE-ACETAMINOPHEN (NORCO/VICODIN) 5-325 MG TABLET    Take one tablet by mouth every 6 hours for pain. Not to exceed 3000mg  of APAP from all sources /24hr   HYDROCORTISONE CREAM 1 %    Apply 1 application topically 2 (two) times daily.   LORAZEPAM (ATIVAN) 0.5 MG TABLET    Take 0.25 mg by mouth every 8 (eight) hours.   MAGNESIUM HYDROXIDE (MILK OF MAGNESIA) 400 MG/5ML SUSPENSION    Take 30 mLs by mouth  daily as needed for mild constipation.   MENTHOL-ZINC OXIDE (REMEDY CALAZIME EX)    Apply to buttocks/groin twice daily   MORPHINE (ROXANOL) 20 MG/ML CONCENTRATED SOLUTION    Take 0.25 mg by mouth daily.   MULTIPLE VITAMINS-MINERALS (DECUBI-VITE PO)    Take 2 capsules by mouth daily. Reported on 11/27/2015   NUTRITIONAL SUPPLEMENTS (NUTRITIONAL SUPPLEMENT PO)    Take 120 mLs by mouth 4 (four) times daily. 2-cal    NYSTATIN (MYCOSTATIN/NYSTOP) 100000 UNIT/GM POWD    Apply topically as needed (PRN redness under breast).   ONDANSETRON (ZOFRAN) 4 MG TABLET    Take 4 mg by mouth every 6 (six) hours as needed for nausea or vomiting.   POLYETHYL GLYCOL-PROPYL GLYCOL 0.4-0.3 % SOLN    Place 1 drop into both eyes 2 (two) times daily. Both eyes    PROTEIN POWD    by Does not apply route. 2 scoops by mouth 3 times daily   RISPERIDONE (RISPERDAL) 0.25 MG TABLET    Take 0.25 mg by mouth daily.   RISPERIDONE (RISPERDAL) 0.5 MG TABLET    Take 0.5 mg by mouth at bedtime.  Modified Medications   No medications on file  Discontinued Medications   No medications on file     SIGNIFICANT DIAGNOSTIC EXAMS   not a candidate for mammogram; dexa or colonoscopy  07-16-14:  left forearm x-ray: no acute fracture  07-16-14: left humerus x-ray: no acute fracture    LABS REVIEWED:   11-21-15: wbc 8.3; hgb 11.1; hct 33.6; mcv 94.1 plt 442; glucose 102; bun 12; creat 0.40; k+ 3.1; na++ 139; total bili 1.5; albumin 2.7  01-16-16: right foot culture: providencia stuartii: cipro     Review of Systems Unable to perform ROS: dementia     Physical Exam Constitutional: No distress.  Neck: Neck supple. No JVD present. No thyromegaly present.  Cardiovascular: Normal rate, normal heart sounds and intact distal pulses.   Heart rate slightly irregular  Pedal pulses not palpable   Respiratory: Effort normal and breath sounds normal. No respiratory distress.  GI: Soft. Bowel sounds are normal. She exhibits no distension. There is no tenderness.  Musculoskeletal: right lower extremity edema   Right upper extremity contracture especially in the wrist  Left upper extremity contracture Does not move lower extremities Has contracture in right lower extremity   Neurological: She is alert.  Skin: Skin is warm and dry. She is not diaphoretic. Sacrum stage IV: 7.7 x 2.0 x 0.5 cm Right posterior hip: stage IV: 1.1 x 1.2 x 1.3 cm Skin in contracture area is red with yeast present,     ASSESSMENT/ PLAN:  1. Yeast infection on skin: will begin diflucan 150 mg daily for 7 days. Will monitor  MD is aware of resident's narcotic use and is in agreement with current plan of care. We will attempt to wean resident as apropriate   Synthia Innocenteborah Green NP [facility]  Contact [phone] Monday through Friday 8am- 5pm  After hours call [phone]

## 2016-08-12 DIAGNOSIS — B372 Candidiasis of skin and nail: Secondary | ICD-10-CM | POA: Insufficient documentation

## 2016-08-28 ENCOUNTER — Non-Acute Institutional Stay (SKILLED_NURSING_FACILITY): Payer: Medicare Other | Admitting: Adult Health

## 2016-08-28 ENCOUNTER — Encounter: Payer: Self-pay | Admitting: Adult Health

## 2016-08-28 DIAGNOSIS — L8994 Pressure ulcer of unspecified site, stage 4: Secondary | ICD-10-CM | POA: Diagnosis not present

## 2016-08-28 DIAGNOSIS — I638 Other cerebral infarction: Secondary | ICD-10-CM | POA: Diagnosis not present

## 2016-08-28 DIAGNOSIS — I69354 Hemiplegia and hemiparesis following cerebral infarction affecting left non-dominant side: Secondary | ICD-10-CM

## 2016-08-28 DIAGNOSIS — R627 Adult failure to thrive: Secondary | ICD-10-CM | POA: Diagnosis not present

## 2016-08-28 DIAGNOSIS — R634 Abnormal weight loss: Secondary | ICD-10-CM

## 2016-08-28 DIAGNOSIS — F319 Bipolar disorder, unspecified: Secondary | ICD-10-CM | POA: Diagnosis not present

## 2016-08-28 DIAGNOSIS — G894 Chronic pain syndrome: Secondary | ICD-10-CM

## 2016-08-28 DIAGNOSIS — I6389 Other cerebral infarction: Secondary | ICD-10-CM

## 2016-08-28 NOTE — Progress Notes (Signed)
Location:   [facility] Room Number: 227 B Place of Service:  SNF (31)   CODE STATUS: DNR  Allergies  Allergen Reactions  . Demerol     Per MAR.    Marland Kitchen Ampicillin     Per facility [facility].   Marland Kitchen Aspirin     Per facility [facility].    Marland Kitchen Codeine     Per MAR.    . Dilaudid [Hydromorphone Hcl]     Per MAR.   Marland Kitchen Elavil [Amitriptyline]     Per MAR.   Marland Kitchen Flexeril [Cyclobenzaprine Hcl]     Per MAR.    . Nsaids     Per facility [facility].    . Other     COX2 inhibitors. Per facility [facility].    Marland Kitchen Penicillins     Per MAR.    . Tuberculin Tests    Chief Complaint  Patient presents with  . Annual Exam   HPI:  She is a long term resident of this facility being seen for her annual exam. She continues to decline over this past year. She continues to be followed by hospice care. She has not been hospitalized in the past year. She does spend all of her time in her room.    Past Medical History:  Diagnosis Date  . Allergy   . Anemia   . Anemia of chronic disease 03/14/2015  . 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 Bipolar 1 disorder (HCC)   . Cerebrovascular accident (HCC)   . Dementia   . GERD (gastroesophageal reflux disease)   . Hypertension   . Hyponatremia   . Schizoaffective disorder   . Thyroid disease     Past Surgical History:  Procedure Laterality Date  . APPENDECTOMY    . FRACTURE SURGERY      Social History   Social History  . Marital status: Married    Spouse name: N/A  . Number of children: N/A  . Years of education: N/A   Occupational History  . Not on file.   Social History Main Topics  . Smoking status: Former Smoker    Packs/day: 1.00    Types: Cigarettes  . Smokeless tobacco: Never Used  . Alcohol use No  . Drug use: No  . Sexual activity: Not on file   Other Topics Concern  . Not on file   Social History Narrative  . No narrative on file   History reviewed. No pertinent family history.    VITAL SIGNS BP 138/70   Pulse  70   Temp 98 F (36.7 C)   Resp 18   Ht  (1.575 m)   Wt 68 lb (30.8 kg)   SpO2 97%   BMI 12.44 kg/m   Patient's Medications  New Prescriptions   No medications on file  Previous Medications   DIVALPROEX (DEPAKOTE SPRINKLE) 125 MG CAPSULE    Take 125 mg by mouth daily. For anxiety disorder Give 3 capsule qhs   HYDROCODONE-ACETAMINOPHEN (NORCO) 7.5-325 MG TABLET    Take 1 tablet by mouth every 6 (six) hours.   MAGNESIUM HYDROXIDE (MILK OF MAGNESIA) 400 MG/5ML SUSPENSION    Take 30 mLs by mouth daily as needed for mild constipation.   MORPHINE (ROXANOL) 20 MG/ML CONCENTRATED SOLUTION    Take 0.25 mg by mouth daily.   MULTIPLE VITAMINS-MINERALS (DECUBI-VITE PO)    Take 2 capsules by mouth daily. Reported on 11/27/2015   NUTRITIONAL SUPPLEMENTS (NUTRITIONAL SUPPLEMENT PO)    Take  120 mLs by mouth 4 (four) times daily. 2-cal    NYSTATIN (MYCOSTATIN/NYSTOP) 100000 UNIT/GM POWD    Apply topically as needed (PRN redness under breast).   ONDANSETRON (ZOFRAN) 4 MG TABLET    Take 4 mg by mouth every 6 (six) hours as needed for nausea or vomiting.   POLYETHYL GLYCOL-PROPYL GLYCOL 0.4-0.3 % SOLN    Place 1 drop into both eyes 2 (two) times daily. Both eyes    PROTEIN POWD    2 scoops by mouth 3 times daily    RISPERIDONE (RISPERDAL) 0.25 MG TABLET    Take 0.25 mg by mouth daily.   RISPERIDONE (RISPERDAL) 0.5 MG TABLET    Take 0.5 mg by mouth at bedtime.   SKIN PROTECTANTS, MISC. (CALAZIME SKIN PROTECTANT EX)    Apply to buttocks every day and night shift for skin integrity  Modified Medications   No medications on file  Discontinued Medications   HYDROCODONE-ACETAMINOPHEN (NORCO/VICODIN) 5-325 MG TABLET    Take one tablet by mouth every 6 hours for pain. Not to exceed  of APAP from all sources /24hr   HYDROCORTISONE CREAM 1 %    Apply 1 application topically 2 (two) times daily.   LORAZEPAM (ATIVAN) 0.5 MG TABLET    Take 0.25 mg by mouth every 8 (eight) hours.   MAGNESIUM HYDROXIDE  (MILK OF MAGNESIA) 400 MG/5ML SUSPENSION    Take 30 mLs by mouth daily as needed for mild constipation.   MENTHOL-ZINC OXIDE (REMEDY CALAZIME EX)    Apply to buttocks/groin twice daily     SIGNIFICANT DIAGNOSTIC EXAMS  not a candidate for mammogram; dexa or colonoscopy  07-16-14: left forearm x-ray: no acute fracture  07-16-14: left humerus x-ray: no acute fracture    LABS REVIEWED:   11-21-15: wbc 8.3; hgb 11.1; hct 33.6; mcv 94.1 plt 442; glucose 102; bun 12; creat 0.40; k+ 3.1; na++ 139; total bili 1.5; albumin 2.7  01-16-16: right foot culture: providencia stuartii: cipro     Review of Systems Unable to perform ROS: dementia     Physical Exam Constitutional: No distress.  Neck: Neck supple. No JVD present. No thyromegaly present.  Cardiovascular: Normal rate, normal heart sounds and intact distal pulses.   Heart rate slightly irregular  Pedal pulses not palpable   Respiratory: Effort normal and breath sounds normal. No respiratory distress.  GI: Soft. Bowel sounds are normal. She exhibits no distension. There is no tenderness.  Musculoskeletal: right lower extremity edema   Right upper extremity contracture especially in the wrist  Left upper extremity contracture Does not move lower extremities Has contracture in right lower extremity   Neurological: She is alert.  Skin: Skin is warm and dry. She is not diaphoretic. Wound dressings intact     ASSESSMENT/ PLAN:   1. FTT/weight loss: will continue her supplements per the facility protocol. Her current weight is 69 pounds with  148 pounds in may 2016. . Will not make changes and will monitor her status.   2. Chronic pain: has contractures on  right  wrist/hand. Will continue vicodin 7.5/325 mg every 6 hours routinely and roxanol 5 mg daily and will monitor there are no indications of pain present.   3. Bipolar disorder: will continue her depakote sprinkles 125 mg in the AM 375 mg in the PM  and risperdal 0.25 mg in  the AM and 0.5 mg nightly and  ativan 0.25 mg every 8 hours   4. Dementia: is end stage; is  not  on medications; she is followed by hospice care; her weight is 69  pounds her status is end stage weight loss is an expected outcome at this stage of her disease process.   5. 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    6. 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 Unfortunately as she continues to lose weight; and continues to decline her skin will continue to breakdown.  Will continue treatment per wound doctor     Her health maintenance is up to date.    Time spent with patient 45   minutes >50% time spent counseling; reviewing medical record; tests; labs; and developing future plan of care   MD is aware of resident's narcotic use and is in agreement with current plan of care. We will attempt to wean resident as apropriate   Synthia Innocent NP [facility]  Contact [phone] Monday through Friday 8am- 5pm  After hours call [phone]

## 2016-09-30 ENCOUNTER — Encounter: Payer: Self-pay | Admitting: Adult Health

## 2016-09-30 ENCOUNTER — Non-Acute Institutional Stay (SKILLED_NURSING_FACILITY): Payer: Medicare Other | Admitting: Adult Health

## 2016-09-30 DIAGNOSIS — I6389 Other cerebral infarction: Secondary | ICD-10-CM

## 2016-09-30 DIAGNOSIS — I638 Other cerebral infarction: Secondary | ICD-10-CM | POA: Diagnosis not present

## 2016-09-30 DIAGNOSIS — R627 Adult failure to thrive: Secondary | ICD-10-CM

## 2016-09-30 DIAGNOSIS — R634 Abnormal weight loss: Secondary | ICD-10-CM | POA: Diagnosis not present

## 2016-09-30 DIAGNOSIS — G894 Chronic pain syndrome: Secondary | ICD-10-CM | POA: Diagnosis not present

## 2016-09-30 DIAGNOSIS — I69354 Hemiplegia and hemiparesis following cerebral infarction affecting left non-dominant side: Secondary | ICD-10-CM

## 2016-09-30 DIAGNOSIS — F0150 Vascular dementia without behavioral disturbance: Secondary | ICD-10-CM | POA: Diagnosis not present

## 2016-09-30 NOTE — Progress Notes (Signed)
Location:   [facility] Room Number: 227 B Place of Service:  SNF (31)   CODE STATUS: DNR  Allergies  Allergen Reactions  . Demerol     Per MAR.    Marland Kitchen Ampicillin     Per facility [facility].   Marland Kitchen Aspirin     Per facility [facility].    Marland Kitchen Codeine     Per MAR.    . Dilaudid [Hydromorphone Hcl]     Per MAR.   Marland Kitchen Elavil [Amitriptyline]     Per MAR.   Marland Kitchen Flexeril [Cyclobenzaprine Hcl]     Per MAR.    . Nsaids     Per facility [facility].    . Other     COX2 inhibitors. Per facility [facility].    Marland Kitchen Penicillins     Per MAR.    . Tuberculin Tests     Chief Complaint  Patient presents with  . Medical Management of Chronic Issues    1 month follow up    HPI:  She is a long term resident of this facility being seen for the management of her chronic illnesses. She continues to be followed by hospice care. Staff reports that she is up all nightly and is yelling out disturbing others. She is unable to participate in the hpi or ros.    Past Medical History:  Diagnosis Date  . Allergy   . Anemia   . Anemia of chronic disease 03/14/2015  . 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 Bipolar 1 disorder (HCC)   . Cerebrovascular accident (HCC)   . Dementia   . GERD (gastroesophageal reflux disease)   . Hypertension   . Hyponatremia   . Schizoaffective disorder   . Thyroid disease     Past Surgical History:  Procedure Laterality Date  . APPENDECTOMY    . FRACTURE SURGERY      Social History   Social History  . Marital status: Married    Spouse name: N/A  . Number of children: N/A  . Years of education: N/A   Occupational History  . Not on file.   Social History Main Topics  . Smoking status: Former Smoker    Packs/day: 1.00    Types: Cigarettes  . Smokeless tobacco: Never Used  . Alcohol use No  . Drug use: No  . Sexual activity: Not on file   Other Topics Concern  . Not on file   Social History Narrative  . No narrative on file   History reviewed. No  pertinent family history.    VITAL SIGNS BP (!) 78/43   Pulse 78   Temp 97.2 F (36.2 C)   Resp 20   Ht 5\' 2"  (1.575 m)   Wt 68 lb (30.8 kg)   SpO2 97%   BMI 12.44 kg/m   Patient's Medications  New Prescriptions   No medications on file  Previous Medications   DIVALPROEX (DEPAKOTE SPRINKLE) 125 MG CAPSULE    Take 125 mg by mouth daily. For anxiety disorder Give 3 capsule qhs   HYDROCODONE-ACETAMINOPHEN (NORCO) 7.5-325 MG TABLET    Take 1 tablet by mouth every 6 (six) hours.   MAGNESIUM HYDROXIDE (MILK OF MAGNESIA) 400 MG/5ML SUSPENSION    Take 30 mLs by mouth daily as needed for mild constipation.   MORPHINE (ROXANOL) 20 MG/ML CONCENTRATED SOLUTION    Take 0.25 mg by mouth daily.   MULTIPLE VITAMINS-MINERALS (DECUBI-VITE PO)    Take 2 capsules by mouth daily. Reported  on 11/27/2015   NUTRITIONAL SUPPLEMENTS (NUTRITIONAL SUPPLEMENT PO)    Take 120 mLs by mouth 4 (four) times daily. 2-cal    NYSTATIN (MYCOSTATIN/NYSTOP) 100000 UNIT/GM POWD    Apply topically as needed (PRN redness under breast).   POLYETHYL GLYCOL-PROPYL GLYCOL 0.4-0.3 % SOLN    Place 1 drop into both eyes 2 (two) times daily. Both eyes    PROCHLORPERAZINE (COMPAZINE) 5 MG TABLET    Take 5 mg by mouth every 6 (six) hours as needed for nausea or vomiting.   PROTEIN POWD    2 scoops by mouth 3 times daily    RISPERIDONE (RISPERDAL) 0.25 MG TABLET    Take 0.25 mg by mouth daily.   RISPERIDONE (RISPERDAL) 0.5 MG TABLET    Take 0.5 mg by mouth at bedtime.   SKIN PROTECTANTS, MISC. (CALAZIME SKIN PROTECTANT EX)    Apply to buttocks every day and night shift for skin integrity  Modified Medications   No medications on file  Discontinued Medications   ONDANSETRON (ZOFRAN) 4 MG TABLET    Take 4 mg by mouth every 6 (six) hours as needed for nausea or vomiting.     SIGNIFICANT DIAGNOSTIC EXAMS  not a candidate for mammogram; dexa or colonoscopy  07-16-14: left forearm x-ray: no acute fracture  07-16-14: left humerus  x-ray: no acute fracture    LABS REVIEWED:   11-21-15: wbc 8.3; hgb 11.1; hct 33.6; mcv 94.1 plt 442; glucose 102; bun 12; creat 0.40; k+ 3.1; na++ 139; total bili 1.5; albumin 2.7  01-16-16: right foot culture: providencia stuartii: cipro     Review of Systems Unable to perform ROS: dementia     Physical Exam Constitutional: No distress.  Neck: Neck supple. No JVD present. No thyromegaly present.  Cardiovascular: Normal rate, normal heart sounds and intact distal pulses.   Heart rate slightly irregular  Pedal pulses not palpable   Respiratory: Effort normal and breath sounds normal. No respiratory distress.  GI: Soft. Bowel sounds are normal. She exhibits no distension. There is no tenderness.  Musculoskeletal: right lower extremity edema   Right upper extremity contracture especially in the wrist  Left upper extremity contracture Does not move lower extremities Has contracture in right lower extremity   Neurological: She is alert.  Skin: Skin is warm and dry. She is not diaphoretic.    ASSESSMENT/ PLAN:   1. FTT/weight loss: will continue her supplements per the facility protocol. Her current weight is 68 pounds with  148 pounds in may 2016. . Will not make changes and will monitor her status.   2. Chronic pain: has contractures on  right  wrist/hand. Will continue vicodin 7.5/325 mg every 6 hours routinely and roxanol 5 mg daily and will monitor there are no indications of pain present.   3. Bipolar disorder: will continue her depakote sprinkles 125 mg in the AM 375 mg in the PM   risperdal 0.25 mg in the AM and 0.5 mg nightly  Will increase ativan to 0.5 mg every 8 hours   4. Dementia: is end stage; is  not on medications; she is followed by hospice care; her weight is 68 pounds her status is end stage weight loss is an expected outcome at this stage of her disease process.   5. 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      MD is aware of resident's narcotic use and is in agreement with current plan of  care. We will attempt to wean resident as apropriate    Synthia Innocenteborah Markus Casten NP [facility]  Contact [phone] Monday through Friday 8am- 5pm  After hours call [phone]

## 2016-10-21 ENCOUNTER — Encounter: Payer: Self-pay | Admitting: Adult Health

## 2016-10-21 ENCOUNTER — Non-Acute Institutional Stay (SKILLED_NURSING_FACILITY): Payer: Medicare Other | Admitting: Adult Health

## 2016-10-21 DIAGNOSIS — R627 Adult failure to thrive: Secondary | ICD-10-CM | POA: Diagnosis not present

## 2016-10-21 DIAGNOSIS — F319 Bipolar disorder, unspecified: Secondary | ICD-10-CM | POA: Diagnosis not present

## 2016-10-21 DIAGNOSIS — R634 Abnormal weight loss: Secondary | ICD-10-CM | POA: Diagnosis not present

## 2016-10-21 NOTE — Progress Notes (Signed)
Location:   [facility] Room Number: 227 B Place of Service:  SNF (31)   CODE STATUS: DNR  Allergies  Allergen Reactions  . Demerol     Per MAR.    Marland Kitchen. Ampicillin     Per facility [facility].   Marland Kitchen. Aspirin     Per facility [facility].    Marland Kitchen. Codeine     Per MAR.    . Dilaudid [Hydromorphone Hcl]     Per MAR.   Marland Kitchen. Elavil [Amitriptyline]     Per MAR.   Marland Kitchen. Flexeril [Cyclobenzaprine Hcl]     Per MAR.    . Nsaids     [facility]    . Other     [facility]. [facility]    Marland Kitchen. Penicillins     Per MAR.    . Tuberculin Tests     Chief Complaint  Patient presents with  . Acute Visit    Behaviors    HPI:  Staff is reporting that she is having increased periods of time when she is screaming out; usually in the evenings. She is unable to participate in the hpi or ros.    Past Medical History:  Diagnosis Date  . Allergy   . Anemia   . Anemia of chronic disease 03/14/2015  . 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 Bipolar 1 disorder (HCC)   . Cerebrovascular accident (HCC)   . Dementia   . GERD (gastroesophageal reflux disease)   . Hypertension   . Hyponatremia   . Schizoaffective disorder   . Thyroid disease     Past Surgical History:  Procedure Laterality Date  . APPENDECTOMY    . FRACTURE SURGERY      Social History   Social History  . Marital status: Married    Spouse name: N/A  . Number of children: N/A  . Years of education: N/A   Occupational History  . Not on file.   Social History Main Topics  . Smoking status: Former Smoker    Packs/day: 1.00    Types: Cigarettes  . Smokeless tobacco: Never Used  . Alcohol use No  . Drug use: No  . Sexual activity: Not on file   Other Topics Concern  . Not on file   Social History Narrative  . No narrative on file   History reviewed. No pertinent family history.    VITAL SIGNS Ht 5\' 2"  (1.575 m)   Wt 68 lb (30.8 kg)   BMI 12.44 kg/m   Patient's Medications  New  Prescriptions   No medications on file  Previous Medications   DIVALPROEX (DEPAKOTE SPRINKLE) 125 MG CAPSULE    Take 125 mg by mouth daily. For anxiety disorder Give 3 capsule qhs   HYDROCODONE-ACETAMINOPHEN (NORCO) 7.5-325 MG TABLET    Take 1 tablet by mouth every 6 (six) hours.   LORAZEPAM (ATIVAN) 0.5 MG TABLET    Take 0.5 mg by mouth 3 (three) times daily.   MAGNESIUM HYDROXIDE (MILK OF MAGNESIA) 400 MG/5ML SUSPENSION    Take 30 mLs by mouth daily as needed for mild constipation.   MORPHINE (ROXANOL) 20 MG/ML CONCENTRATED SOLUTION    Take 0.25 mg by mouth daily.   MULTIPLE VITAMINS-MINERALS (DECUBI-VITE PO)    Take 2 capsules by mouth daily. Reported on 11/27/2015   NUTRITIONAL SUPPLEMENTS (NUTRITIONAL SUPPLEMENT PO)    Take 120 mLs by mouth 4 (four) times daily. 2-cal    NYSTATIN (MYCOSTATIN/NYSTOP) 100000 UNIT/GM POWD  Apply topically as needed (PRN redness under breast).   POLYETHYL GLYCOL-PROPYL GLYCOL 0.4-0.3 % SOLN    Place 1 drop into both eyes 2 (two) times daily. Both eyes    PROCHLORPERAZINE (COMPAZINE) 5 MG TABLET    Take 5 mg by mouth every 6 (six) hours as needed for nausea or vomiting.   PROTEIN POWD    2 scoops by mouth 3 times daily    RISPERIDONE (RISPERDAL) 0.5 MG TABLET    0.5 mg. Give 0.5mg  by mouth two times daily in the morning and at bedtime   SKIN PROTECTANTS, MISC. (CALAZIME SKIN PROTECTANT EX)    Apply to buttocks every day and night shift for skin integrity  Modified Medications   No medications on file  Discontinued Medications   RISPERIDONE (RISPERDAL) 0.5 MG TABLET    Take 0.5 mg by mouth at bedtime.     SIGNIFICANT DIAGNOSTIC EXAMS  not a candidate for mammogram; dexa or colonoscopy  07-16-14: left forearm x-ray: no acute fracture  07-16-14: left humerus x-ray: no acute fracture    LABS REVIEWED:   11-21-15: wbc 8.3; hgb 11.1; hct 33.6; mcv 94.1 plt 442; glucose 102; bun 12; creat 0.40; k+ 3.1; na++ 139; total bili 1.5; albumin 2.7  01-16-16: right  foot culture: providencia stuartii: cipro     Review of Systems Unable to perform ROS: dementia     Physical Exam Constitutional: No distress.  Neck: Neck supple. No JVD present. No thyromegaly present.  Cardiovascular: Normal rate, normal heart sounds and intact distal pulses.   Heart rate slightly irregular  Pedal pulses not palpable   Respiratory: Effort normal and breath sounds normal. No respiratory distress.  GI: Soft. Bowel sounds are normal. She exhibits no distension. There is no tenderness.  Musculoskeletal: right lower extremity edema   Right upper extremity contracture especially in the wrist  Left upper extremity contracture Does not move lower extremities Has contracture in right lower extremity   Neurological: She is alert.  Skin: Skin is warm and dry. She is not diaphoretic. Right posterior hip: stage IV: 3.0 x 0.5 x 0.7 cm  Sacrum: stage IV: 2.6 x 1.2 x 0.2 cm   ASSESSMENT/ PLAN:   1. FTT/weight loss: will continue her supplements per the facility protocol. Her current weight is 68 pounds with  148 pounds in may 2016. . Will not make changes and will monitor her status.   2. Bipolar disorder: will continue her depakote sprinkles 125 mg in the AM 375 mg in the PM   risperdal 0.25 mg in the AM and 0.5 mg nightly  Will change to ativan  1 mg every 8 hours for anxiety and will monitor      MD is aware of resident's narcotic use and is in agreement with current plan of care. We will attempt to wean resident as apropriate     Synthia Innocent NP [facility]  Contact [phone] Monday through Friday 8am- 5pm  After hours call [phone]

## 2016-10-26 ENCOUNTER — Encounter: Payer: Self-pay | Admitting: Internal Medicine

## 2016-10-26 ENCOUNTER — Non-Acute Institutional Stay (SKILLED_NURSING_FACILITY): Payer: Medicare Other | Admitting: Internal Medicine

## 2016-10-26 DIAGNOSIS — R627 Adult failure to thrive: Secondary | ICD-10-CM | POA: Diagnosis not present

## 2016-10-26 DIAGNOSIS — F319 Bipolar disorder, unspecified: Secondary | ICD-10-CM | POA: Diagnosis not present

## 2016-10-26 DIAGNOSIS — Z8673 Personal history of transient ischemic attack (TIA), and cerebral infarction without residual deficits: Secondary | ICD-10-CM

## 2016-10-26 DIAGNOSIS — G894 Chronic pain syndrome: Secondary | ICD-10-CM

## 2016-10-26 DIAGNOSIS — F0150 Vascular dementia without behavioral disturbance: Secondary | ICD-10-CM | POA: Diagnosis not present

## 2016-10-26 NOTE — Progress Notes (Signed)
Patient ID: [name], female   DOB: 10/02/1940, 76 y.o.   MRN: [id number]    DATE:  10/26/2016  Location:    [facility] Room Number: 227 B Place of Service: SNF (31)   Extended Emergency Contact Information Primary Emergency Contact: Sandria Senter Address: [address]           [place], [place] [address] D[place] of [place] Home Phone: [phone] Mobile Phone: [phone] Relation: Son Secondary Emergency Contact: Marilu Favre Address: [address]          [place], [place] [address] Home Phone: [phone] Relation: None  Advanced Directive information Does Patient Have a Medical Advance Directive?: Yes, Type of Advance Directive: Out of facility DNR (pink MOST or yellow form), Pre-existing out of facility DNR order (yellow form or pink MOST form): Yellow form placed in chart (order not valid for inpatient use);Pink MOST form placed in chart (order not valid for inpatient use), Does patient want to make changes to medical advance directive?: No - Patient declined  Chief Complaint  Patient presents with  . Medical Management of Chronic Issues    Routine Visit    HPI:  76 yo female long term resident seen today for f/u. She has no c/o today. No nursing issues. Appetite poor. Sleeps well. She is a poor historian due to dementia. Hx obtained from chart. She is on hospice  FTT/weight loss - she gets supplements per the facility protocol. Her current weight is 68 lbs (prev. 96 lbs in June 2017)  Chronic pain - 2/2 contractures of right wrist/hand.pain stable on vicodin 7.5/325 mg every 6 hours routinely and roxanol 5 mg daily.   Bipolar disorder - mood stable on depakote sprinkles 125 mg in the AM 375 mg in the PM; risperdal 0.5 mg in the AM and qHS; ativan 0.5 mg TID  Dementia - end stage and not on medications;followed by hospice care; weight is 68 lbs (down approx 30 lbs in last yr) - weight loss is an expected outcome at this stage of her disease process.   Hx  CVA - she has right hemiparesis with contractures present.  No new sx's. She has ASA allergy  Hypothyroidism - not on any medication. No recent TSH  Past Medical History:  Diagnosis Date  . Allergy   . Anemia   . Anemia of chronic disease 03/14/2015  . 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 Bipolar 1 disorder (HCC)   . Cerebrovascular accident (HCC)   . Dementia   . GERD (gastroesophageal reflux disease)   . Hypertension   . Hyponatremia   . Schizoaffective disorder   . Thyroid disease     Past Surgical History:  Procedure Laterality Date  . APPENDECTOMY    . FRACTURE SURGERY      Patient Care Team: Kirt Boys, DO as PCP - General (Internal Medicine) Chilton Si Chong Sicilian, NP as Nurse Practitioner (Nurse Practitioner) Center[facility] Facility)  Social History   Social History  . Marital status: Married    Spouse name: N/A  . Number of children: N/A  . Years of education: N/A   Occupational History  . Not on file.   Social History Main Topics  . Smoking status: Former Smoker    Packs/day: 1.00    Types: Cigarettes  . Smokeless tobacco: Never Used  . Alcohol use No  . Drug use: No  . Sexual activity: Not on file   Other Topics Concern  .  Not on file   Social History Narrative  . No narrative on file     reports that she has quit smoking. Her smoking use included Cigarettes. She smoked 1.00 pack per day. She has never used smokeless tobacco. She reports that she does not drink alcohol or use drugs.  History reviewed. No pertinent family history. No family status information on file.    Immunization History  Administered Date(s) Administered  . Influenza Whole 02/27/2013  . Influenza-Unspecified 02/26/2014, 07/05/2015  . PPD Test 02/07/2016, 09/22/2016  . Pneumococcal-Unspecified 10/06/2011    Allergies  Allergen Reactions  . Demerol     Per MAR.    Marland Kitchen. Ampicillin     Per facility [facility].   Marland Kitchen. Aspirin      Per facility MAR.    Marland Kitchen. Codeine     Per MAR.    . Dilaudid [Hydromorphone Hcl]     Per MAR.   Marland Kitchen. Elavil [Amitriptyline]     Per MAR.   Marland Kitchen. Flexeril [Cyclobenzaprine Hcl]     Per MAR.    . Nsaids     [facility]    . Other     COX2 inhibitors. [facility]    Marland Kitchen. Penicillins     Per MAR.    . Tuberculin Tests     Medications: Patient's Medications  New Prescriptions   No medications on file  Previous Medications   DIVALPROEX (DEPAKOTE SPRINKLE) 125 MG CAPSULE    Take 125 mg by mouth daily. For anxiety disorder Give 3 capsule qhs   HYDROCODONE-ACETAMINOPHEN (NORCO) 7.5-325 MG TABLET    Take 1 tablet by mouth every 6 (six) hours.   LORAZEPAM (ATIVAN) 0.5 MG TABLET    Take 0.5 mg by mouth 3 (three) times daily.   MAGNESIUM HYDROXIDE (MILK OF MAGNESIA) 400 MG/5ML SUSPENSION    Take 30 mLs by mouth daily as needed for mild constipation.   MORPHINE (ROXANOL) 20 MG/ML CONCENTRATED SOLUTION    Take 0.25 mg by mouth daily.   MULTIPLE VITAMINS-MINERALS (DECUBI-VITE PO)    Take 2 capsules by mouth daily. Reported on 11/27/2015   NUTRITIONAL SUPPLEMENTS (NUTRITIONAL SUPPLEMENT PO)    Take 120 mLs by mouth 4 (four) times daily. 2-cal    NYSTATIN (MYCOSTATIN/NYSTOP) 100000 UNIT/GM POWD    Apply topically as needed (PRN redness under breast).   PROCHLORPERAZINE (COMPAZINE) 5 MG TABLET    Take 5 mg by mouth every 6 (six) hours as needed for nausea or vomiting.   PROTEIN POWD    2 scoops by mouth 3 times daily    RISPERIDONE (RISPERDAL) 0.5 MG TABLET    0.5 mg. Give 0.5mg  by mouth two times daily in the morning and at bedtime   SKIN PROTECTANTS, MISC. (CALAZIME SKIN PROTECTANT EX)    Apply to buttocks every day and night shift for skin integrity  Modified Medications   No medications on file  Discontinued Medications   POLYETHYL GLYCOL-PROPYL GLYCOL 0.4-0.3 % SOLN    Place 1 drop into both eyes 2 (two) times daily. Both eyes     Review of Systems  Unable to perform ROS: Dementia     Vitals:   10/26/16 1420  BP: 109/62  Pulse: 76  Resp: 18  Temp: 97.4 F (36.3 C)  TempSrc: Oral  SpO2: 98%  Weight: 68 lb 9.6 oz (31.1 kg)  Height: 5\' 2"  (1.575 m)   Body mass index is 12.55 kg/m.  Physical Exam  Constitutional: She appears cachectic. She  has a sickly appearance.  frail appearing in NAD, lying in bed  HENT:  Mouth/Throat: Oropharynx is clear and moist. No oropharyngeal exudate.  MMM; no oral thrush  Eyes: Pupils are equal, round, and reactive to light. No scleral icterus.  Neck: Neck supple. Carotid bruit is not present. No tracheal deviation present. No thyromegaly present.  Cardiovascular: Normal rate, regular rhythm and intact distal pulses.  Exam reveals no gallop and no friction rub.   Murmur (2/6 SEM --> carotid b/l) heard. No LE edema b/l. no calf TTP.   Pulmonary/Chest: Effort normal and breath sounds normal. No stridor. No respiratory distress. She has no wheezes. She has no rales.  Abdominal: Soft. Normal appearance and bowel sounds are normal. She exhibits no distension and no mass. There is no hepatomegaly. There is no tenderness. There is no rigidity, no rebound and no guarding. No hernia.  Musculoskeletal: She exhibits edema and deformity (b/l LE contractures; right wrist/hand cx).  Lymphadenopathy:    She has no cervical adenopathy.  Neurological: She is alert.  Skin: Skin is warm and dry. No rash noted.  Psychiatric: She has a normal mood and affect. Her behavior is normal.     Labs reviewed: No visits with results within 3 Month(s) from this visit.  Latest known visit with results is:  Abstract on 02/06/2016  Component Date Value Ref Range Status  . HM Diabetic Foot Exam 06/04/2015 done   Final    No results found.   Assessment/Plan   ICD-10-CM   1. FTT (failure to thrive) in adult R62.7   2. Vascular dementia without behavioral disturbance F01.50   3. Chronic pain syndrome G89.4   4. Bipolar 1 disorder (HCC) F31.9   5.  History of CVA (cerebrovascular accident) Z65.73     Continue current meds as ordered  Hospice to follow  Goal is comfort  Will follow  Benson Porcaro S. Ancil Linsey  [facility] [address] [place], [place] [address] [phone] Cell (Monday-Friday 8 AM - 5 PM) [phone] After 5 PM and follow prompts

## 2016-10-27 ENCOUNTER — Encounter: Payer: Self-pay | Admitting: Adult Health

## 2016-10-27 ENCOUNTER — Non-Acute Institutional Stay (SKILLED_NURSING_FACILITY): Payer: Medicare Other | Admitting: Adult Health

## 2016-10-27 DIAGNOSIS — F482 Pseudobulbar affect: Secondary | ICD-10-CM

## 2016-10-27 DIAGNOSIS — R627 Adult failure to thrive: Secondary | ICD-10-CM

## 2016-10-27 DIAGNOSIS — R634 Abnormal weight loss: Secondary | ICD-10-CM

## 2016-10-27 DIAGNOSIS — F319 Bipolar disorder, unspecified: Secondary | ICD-10-CM

## 2016-10-27 NOTE — Progress Notes (Signed)
Location:   [facility] Room Number: 227 B Place of Service:  SNF (31)   CODE STATUS: DNR  Allergies  Allergen Reactions  . Demerol     Per MAR.    Marland Kitchen Ampicillin     Per facility MAR.   Marland Kitchen Aspirin     Per facility MAR.    Marland Kitchen Codeine     Per MAR.    . Dilaudid [Hydromorphone Hcl]     Per MAR.   Marland Kitchen Elavil [Amitriptyline]     Per MAR.   Marland Kitchen Flexeril [Cyclobenzaprine Hcl]     Per MAR.    . Nsaids     Per facility [facility].    . Other     COX2 inhibitors. Per facility [facility].    Marland Kitchen Penicillins     Per MAR.    . Tuberculin Tests     Chief Complaint  Patient presents with  . Acute Visit    Aggitation    HPI:  She is having frequent episodes of yelling out. There are no reasons for her yelling out. 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 She is being followed by hospice care.    Past Medical History:  Diagnosis Date  . Allergy   . Anemia   . Anemia of chronic disease 03/14/2015  . 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 Bipolar 1 disorder (HCC)   . Cerebrovascular accident (HCC)   . Dementia   . GERD (gastroesophageal reflux disease)   . Hypertension   . Hyponatremia   . Schizoaffective disorder   . Thyroid disease     Past Surgical History:  Procedure Laterality Date  . APPENDECTOMY    . FRACTURE SURGERY      Social History   Social History  . Marital status: Married    Spouse name: N/A  . Number of children: N/A  . Years of education: N/A   Occupational History  . Not on file.   Social History Main Topics  . Smoking status: Former Smoker    Packs/day: 1.00    Types: Cigarettes  . Smokeless tobacco: Never Used  . Alcohol use No  . Drug use: No  . Sexual activity: Not on file   Other Topics Concern  . Not on file   Social History Narrative  . No narrative on file   History reviewed. No pertinent family history.    VITAL SIGNS BP 109/62   Pulse 76   Temp 97.4 F (36.3 C)   Resp 18    Ht 4\' 11"  (1.499 m)   Wt 68 lb 10 oz (31.1 kg)   SpO2 98%   BMI 13.86 kg/m   Patient's Medications  New Prescriptions   No medications on file  Previous Medications   DIVALPROEX (DEPAKOTE SPRINKLE) 125 MG CAPSULE    Take 125 mg by mouth daily. For anxiety disorder Give 3 capsule qhs   HYDROCODONE-ACETAMINOPHEN (NORCO) 7.5-325 MG TABLET    Take 1 tablet by mouth every 6 (six) hours.   LORAZEPAM (ATIVAN) 1 MG TABLET    Take 1 mg by mouth every 8 (eight) hours.    MAGNESIUM HYDROXIDE (MILK OF MAGNESIA) 400 MG/5ML SUSPENSION    Take 30 mLs by mouth daily as needed for mild constipation.   MORPHINE (ROXANOL) 20 MG/ML CONCENTRATED SOLUTION    Take 0.25 mg by mouth daily.   MULTIPLE VITAMINS-MINERALS (DECUBI-VITE PO)    Take 2 capsules by mouth daily. Reported  on 11/27/2015   NUTRITIONAL SUPPLEMENTS (NUTRITIONAL SUPPLEMENT PO)    Take 120 mLs by mouth 4 (four) times daily. 2-cal    NYSTATIN (MYCOSTATIN/NYSTOP) 100000 UNIT/GM POWD    Apply topically as needed (PRN redness under breast).   PROCHLORPERAZINE (COMPAZINE) 5 MG TABLET    Take 5 mg by mouth every 6 (six) hours as needed for nausea or vomiting.   PROTEIN POWD    2 scoops by mouth 3 times daily    RISPERIDONE (RISPERDAL) 0.5 MG TABLET    0.5 mg. Give 0.5mg  by mouth two times daily in the morning and at bedtime   SKIN PROTECTANTS, MISC. (CALAZIME SKIN PROTECTANT EX)    Apply to buttocks every day and night shift for skin integrity  Modified Medications   No medications on file  Discontinued Medications   No medications on file     SIGNIFICANT DIAGNOSTIC EXAMS  not a candidate for mammogram; dexa or colonoscopy  07-16-14: left forearm x-ray: no acute fracture  07-16-14: left humerus x-ray: no acute fracture    LABS REVIEWED:   11-21-15: wbc 8.3; hgb 11.1; hct 33.6; mcv 94.1 plt 442; glucose 102; bun 12; creat 0.40; k+ 3.1; na++ 139; total bili 1.5; albumin 2.7  01-16-16: right foot culture: providencia stuartii: cipro     Review  of Systems Unable to perform ROS: dementia     Physical Exam Constitutional: No distress.  Frail  Neck: Neck supple. No JVD present. No thyromegaly present.  Cardiovascular: Normal rate, normal heart sounds and intact distal pulses.   Heart rate slightly irregular  Pedal pulses not palpable   Respiratory: Effort normal and breath sounds normal. No respiratory distress.  GI: Soft. Bowel sounds are normal. She exhibits no distension. There is no tenderness.  Musculoskeletal: right lower extremity edema   Right upper extremity contracture especially in the wrist  Left upper extremity contracture Does not move lower extremities Has contracture in right lower extremity   Neurological: She is alert.  Skin: Skin is warm and dry. She is not diaphoretic.   ASSESSMENT/ PLAN:   1. FTT/weight loss: will continue her supplements per the facility protocol. Her current weight is 68 pounds with  148 pounds in may 2016. . Will not make changes and will monitor her status.   2. Bipolar disorder: will continue her depakote sprinkles 125 mg in the AM 375 mg in the PM   risperdal 0.25 mg in the AM and 0.5 mg nightly   ativan  1 mg every 8 hours for anxiety and will monitor   3. 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 Hopefully she will benefit from this medication.     MD is aware of resident's narcotic use and is in agreement with current plan of care. We will attempt to wean resident as apropriate     Synthia Innocenteborah Vedha Tercero NP [facility]  Contact [phone] Monday through Friday 8am- 5pm  After hours call [phone]

## 2016-11-05 ENCOUNTER — Non-Acute Institutional Stay (SKILLED_NURSING_FACILITY): Payer: Medicare Other

## 2016-11-05 DIAGNOSIS — Z0000 Encounter for general adult medical examination without abnormal findings: Secondary | ICD-10-CM

## 2016-11-05 NOTE — Patient Instructions (Signed)
Ms. [name] , Thank you for taking time to come for your Medicare Wellness Visit. I appreciate your ongoing commitment to your health goals. Please review the following plan we discussed and let me know if I can assist you in the future.   Screening recommendations/referrals: Colonoscopy up to date, long term pt Mammogram up to date, long term pt Bone Density up to date Recommended yearly ophthalmology/optometry visit for glaucoma screening and checkup Recommended yearly dental visit for hygiene and checkup  Vaccinations: Influenza vaccine due when available Pneumococcal vaccine up to date Tdap vaccine due 05/25/24 Shingles vaccine not in records  Advanced directives: DNR in chart  Conditions/risks identified: None  Next appointment: Dr. Montez Moritaarter makes rounds.   Preventive Care 6065 Years and Older, Female Preventive care refers to lifestyle choices and visits with your health care provider that can promote health and wellness. What does preventive care include?  A yearly physical exam. This is also called an annual well check.  Dental exams once or twice a year.  Routine eye exams. Ask your health care provider how often you should have your eyes checked.  Personal lifestyle choices, including:  Daily care of your teeth and gums.  Regular physical activity.  Eating a healthy diet.  Avoiding tobacco and drug use.  Limiting alcohol use.  Practicing safe sex.  Taking low-dose aspirin every day.  Taking vitamin and mineral supplements as recommended by your health care provider. What happens during an annual well check? The services and screenings done by your health care provider during your annual well check will depend on your age, overall health, lifestyle risk factors, and family history of disease. Counseling  Your health care provider may ask you questions about your:  Alcohol use.  Tobacco use.  Drug use.  Emotional well-being.  Home and relationship  well-being.  Sexual activity.  Eating habits.  History of falls.  Memory and ability to understand (cognition).  Work and work Astronomerenvironment.  Reproductive health. Screening  You may have the following tests or measurements:  Height, weight, and BMI.  Blood pressure.  Lipid and cholesterol levels. These may be checked every 5 years, or more frequently if you are over 75 years old.  Skin check.  Lung cancer screening. You may have this screening every year starting at age 75 if you have a 30-pack-year history of smoking and currently smoke or have quit within the past 15 years.  Fecal occult blood test (FOBT) of the stool. You may have this test every year starting at age 75.  Flexible sigmoidoscopy or colonoscopy. You may have a sigmoidoscopy every 5 years or a colonoscopy every 10 years starting at age 75.  Hepatitis C blood test.  Hepatitis B blood test.  Sexually transmitted disease (STD) testing.  Diabetes screening. This is done by checking your blood sugar (glucose) after you have not eaten for a while (fasting). You may have this done every 1-3 years.  Bone density scan. This is done to screen for osteoporosis. You may have this done starting at age 75.  Mammogram. This may be done every 1-2 years. Talk to your health care provider about how often you should have regular mammograms. Talk with your health care provider about your test results, treatment options, and if necessary, the need for more tests. Vaccines  Your health care provider may recommend certain vaccines, such as:  Influenza vaccine. This is recommended every year.  Tetanus, diphtheria, and acellular pertussis (Tdap, Td) vaccine. You may need a  Td booster every 10 years.  Zoster vaccine. You may need this after age 86.  Pneumococcal 13-valent conjugate (PCV13) vaccine. One dose is recommended after age 70.  Pneumococcal polysaccharide (PPSV23) vaccine. One dose is recommended after age  69. Talk to your health care provider about which screenings and vaccines you need and how often you need them. This information is not intended to replace advice given to you by your health care provider. Make sure you discuss any questions you have with your health care provider. Document Released: 05/31/2015 Document Revised: 01/22/2016 Document Reviewed: 03/05/2015 Elsevier Interactive Patient Education  2017 [place] Prevention in the Home Falls can cause injuries. They can happen to people of all ages. There are many things you can do to make your home safe and to help prevent falls. What can I do on the outside of my home?  Regularly fix the edges of walkways and driveways and fix any cracks.  Remove anything that might make you trip as you walk through a door, such as a raised step or threshold.  Trim any bushes or trees on the path to your home.  Use bright outdoor lighting.  Clear any walking paths of anything that might make someone trip, such as rocks or tools.  Regularly check to see if handrails are loose or broken. Make sure that both sides of any steps have handrails.  Any raised decks and porches should have guardrails on the edges.  Have any leaves, snow, or ice cleared regularly.  Use sand or salt on walking paths during winter.  Clean up any spills in your garage right away. This includes oil or grease spills. What can I do in the bathroom?  Use night lights.  Install grab bars by the toilet and in the tub and shower. Do not use towel bars as grab bars.  Use non-skid mats or decals in the tub or shower.  If you need to sit down in the shower, use a plastic, non-slip stool.  Keep the floor dry. Clean up any water that spills on the floor as soon as it happens.  Remove soap buildup in the tub or shower regularly.  Attach bath mats securely with double-sided non-slip rug tape.  Do not have throw rugs and other things on the floor that can make  you trip. What can I do in the bedroom?  Use night lights.  Make sure that you have a light by your bed that is easy to reach.  Do not use any sheets or blankets that are too big for your bed. They should not hang down onto the floor.  Have a firm chair that has side arms. You can use this for support while you get dressed.  Do not have throw rugs and other things on the floor that can make you trip. What can I do in the kitchen?  Clean up any spills right away.  Avoid walking on wet floors.  Keep items that you use a lot in easy-to-reach places.  If you need to reach something above you, use a strong step stool that has a grab bar.  Keep electrical cords out of the way.  Do not use floor polish or wax that makes floors slippery. If you must use wax, use non-skid floor wax.  Do not have throw rugs and other things on the floor that can make you trip. What can I do with my stairs?  Do not leave any items on the stairs.  Make sure that there are handrails on both sides of the stairs and use them. Fix handrails that are broken or loose. Make sure that handrails are as long as the stairways.  Check any carpeting to make sure that it is firmly attached to the stairs. Fix any carpet that is loose or worn.  Avoid having throw rugs at the top or bottom of the stairs. If you do have throw rugs, attach them to the floor with carpet tape.  Make sure that you have a light switch at the top of the stairs and the bottom of the stairs. If you do not have them, ask someone to add them for you. What else can I do to help prevent falls?  Wear shoes that:  Do not have high heels.  Have rubber bottoms.  Are comfortable and fit you well.  Are closed at the toe. Do not wear sandals.  If you use a stepladder:  Make sure that it is fully opened. Do not climb a closed stepladder.  Make sure that both sides of the stepladder are locked into place.  Ask someone to hold it for you, if  possible.  Clearly mark and make sure that you can see:  Any grab bars or handrails.  First and last steps.  Where the edge of each step is.  Use tools that help you move around (mobility aids) if they are needed. These include:  Canes.  Walkers.  Scooters.  Crutches.  Turn on the lights when you go into a dark area. Replace any light bulbs as soon as they burn out.  Set up your furniture so you have a clear path. Avoid moving your furniture around.  If any of your floors are uneven, fix them.  If there are any pets around you, be aware of where they are.  Review your medicines with your doctor. Some medicines can make you feel dizzy. This can increase your chance of falling. Ask your doctor what other things that you can do to help prevent falls. This information is not intended to replace advice given to you by your health care provider. Make sure you discuss any questions you have with your health care provider. Document Released: 02/28/2009 Document Revised: 10/10/2015 Document Reviewed: 06/08/2014 Elsevier Interactive Patient Education  2017 [employer].

## 2016-11-05 NOTE — Progress Notes (Signed)
Subjective:   [name] is a 76 y.o. female who presents for an Initial Medicare Annual Wellness Visit at [facility]; incapacitated patient unable to answer questions appropriately, hospice patient       Objective:    Today'Crane Vitals   11/05/16 1453  BP: (!) 160/70  Pulse: 80  Temp: 97.4 F (36.3 C)  TempSrc: Oral  SpO2: 96%  Weight: 69 lb (31.3 kg)  Height: 4\' 11"  (1.499 m)   Body mass index is 13.94 kg/m.   Current Medications (verified) Outpatient Encounter Prescriptions as of 11/05/2016  Medication Sig  . divalproex (DEPAKOTE SPRINKLE) 125 MG capsule Take 125 mg by mouth daily. For anxiety disorder Give 3 capsule qhs  . HYDROcodone-acetaminophen (NORCO) 7.5-325 MG tablet Take 1 tablet by mouth every 6 (six) hours.  Marland Kitchen LORazepam (ATIVAN) 1 MG tablet Take 1 mg by mouth every 8 (eight) hours.   . magnesium hydroxide (MILK OF MAGNESIA) 400 MG/5ML suspension Take 30 mLs by mouth daily as needed for mild constipation.  Marland Kitchen morphine (ROXANOL) 20 MG/ML concentrated solution Take 0.25 mg by mouth daily.  . Multiple Vitamins-Minerals (DECUBI-VITE PO) Take 2 capsules by mouth daily. Reported on 11/27/2015  . Nutritional Supplements (NUTRITIONAL SUPPLEMENT PO) Take 120 mLs by mouth 4 (four) times daily. 2-cal   . nystatin (MYCOSTATIN/NYSTOP) 100000 UNIT/GM POWD Apply topically as needed (PRN redness under breast).  . prochlorperazine (COMPAZINE) 5 MG tablet Take 5 mg by mouth every 6 (six) hours as needed for nausea or vomiting.  . Protein POWD 2 scoops by mouth 3 times daily   . risperiDONE (RISPERDAL) 0.5 MG tablet 0.5 mg. Give 0.5mg  by mouth two times daily in the morning and at bedtime  . Skin Protectants, Misc. (CALAZIME SKIN PROTECTANT EX) Apply to buttocks every day and night shift for skin integrity   No facility-administered encounter medications on file as of 11/05/2016.     Allergies (verified) Demerol; Ampicillin; Aspirin; Codeine; Dilaudid  [hydromorphone hcl]; Elavil [amitriptyline]; Flexeril [cyclobenzaprine hcl]; Nsaids; Other; Penicillins; and Tuberculin tests   History: Past Medical History:  Diagnosis Date  . Allergy   . Anemia   . Anemia of chronic disease 03/14/2015  . 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 Bipolar 1 disorder (HCC)   . Cerebrovascular accident (HCC)   . Dementia   . GERD (gastroesophageal reflux disease)   . Hypertension   . Hyponatremia   . Schizoaffective disorder   . Thyroid disease    Past Surgical History:  Procedure Laterality Date  . APPENDECTOMY    . FRACTURE SURGERY     History reviewed. No pertinent family history. Social History   Occupational History  . Not on file.   Social History Main Topics  . Smoking status: Former Smoker    Packs/day: 1.00    Types: Cigarettes  . Smokeless tobacco: Never Used  . Alcohol use No  . Drug use: No  . Sexual activity: Not on file    Tobacco Counseling Counseling given: Not Answered   Activities of Daily Living In your present state of health, do you have any difficulty performing the following activities: 11/05/2016  Hearing? Y  Vision? Y  Difficulty concentrating or making decisions? Y  Walking or climbing stairs? Y  Dressing or bathing? Y  Doing errands, shopping? Y  Preparing Food and eating ? Y  Using the Toilet? Y  In the past six months, have you accidently leaked urine? Y  Do you have problems  with loss of bowel control? Y  Managing your Medications? Y  Managing your Finances? Y  Housekeeping or managing your Housekeeping? Y  Some recent data might be hidden    Immunizations and Health Maintenance Immunization History  Administered Date(Crane) Administered  . Influenza Whole 02/27/2013  . Influenza-Unspecified 02/26/2014, 07/05/2015  . PPD Test 02/07/2016, 09/22/2016  . Pneumococcal-Unspecified 10/06/2011   There are no preventive care reminders to display for this patient.  Patient Care  Team: [name], Monica, DO as PCP - General (Internal Medicine) Chilton SiGreen, Brenda Sicilianeborah S, NP as Nurse Practitioner (Nurse Practitioner) [facility]  Indicate any recent Medical Services you may have received from other than [facility] providers in the past year (date may be approximate).     Assessment:   [facility]   Hearing/Vision screen No exam data present  Dietary issues and exercise activities discussed: Current Exercise Habits: The patient does not participate in regular exercise at present, Exercise limited by: neurologic condition(Crane)  Goals    None     Depression Screen PHQ 2/9 Scores 11/05/2016  Exception Documentation Medical reason    Fall Risk Fall Risk  11/05/2016  Falls in the past year? Exclusion - non ambulatory    Cognitive Function: MMSE - Mini Mental State Exam 11/05/2016  Not completed: Unable to complete        Screening Tests Health Maintenance  Topic Date Due  . INFLUENZA VACCINE  03/10/2017 (Originally 12/16/2016)  . COLONOSCOPY  05/25/2024  . TETANUS/TDAP  05/25/2024  . DEXA SCAN  Completed  . PNA vac Low Risk Adult  Completed      Plan:    I have personally reviewed and addressed the Medicare Annual Wellness questionnaire and have noted the following in the patient'Crane chart:  A. Medical and social history B. Use of alcohol, tobacco or illicit drugs  C. Current medications and supplements D. Functional ability and status E.  Nutritional status F.  Physical activity G. Advance directives H. List of other physicians I.  Hospitalizations, surgeries, and ER visits in previous 12 months J.  Vitals K. Screenings to include hearing, vision, cognitive, depression L. Referrals and appointments - none   See attached scanned questionnaire for additional information.   Signed,   Annetta MawSara Gonthier, RN Nurse Health Advisor   Quick Notes   Health Maintenance: Up to  date     Abnormal Screen: BP 160/70. Unable to complete mental exam     Patient Concerns: None     Nurse Concerns: None

## 2016-11-25 ENCOUNTER — Non-Acute Institutional Stay (SKILLED_NURSING_FACILITY): Payer: Medicare Other | Admitting: Adult Health

## 2016-11-25 DIAGNOSIS — R627 Adult failure to thrive: Secondary | ICD-10-CM | POA: Diagnosis not present

## 2016-11-25 DIAGNOSIS — F0150 Vascular dementia without behavioral disturbance: Secondary | ICD-10-CM

## 2016-11-25 DIAGNOSIS — I6389 Other cerebral infarction: Secondary | ICD-10-CM

## 2016-11-25 DIAGNOSIS — L8994 Pressure ulcer of unspecified site, stage 4: Secondary | ICD-10-CM

## 2016-11-25 DIAGNOSIS — R634 Abnormal weight loss: Secondary | ICD-10-CM | POA: Diagnosis not present

## 2016-11-25 DIAGNOSIS — I638 Other cerebral infarction: Secondary | ICD-10-CM

## 2016-11-25 DIAGNOSIS — G894 Chronic pain syndrome: Secondary | ICD-10-CM

## 2016-11-25 NOTE — Progress Notes (Signed)
Location:   [facility] Room Number: 227 Place of Service:  SNF (31)   CODE STATUS: dnr   Allergies  Allergen Reactions  . Demerol     Per MAR.    Marland Kitchen Ampicillin     Per facility [facility].   Marland Kitchen Aspirin     Per facility [facility].    Marland Kitchen Codeine     Per MAR.    . Dilaudid [Hydromorphone Hcl]     Per MAR.   Marland Kitchen Elavil [Amitriptyline]     Per MAR.   Marland Kitchen Flexeril [Cyclobenzaprine Hcl]     Per MAR.    . Nsaids     Per facility [facility].    . Other     COX2 inhibitors. Per facility [facility].    Marland Kitchen Penicillins     Per MAR.    . Tuberculin Tests     Chief Complaint  Patient presents with  . Medical Management of Chronic Issues    HPI:  She is a 76 year old long term resident of this facility being seen for the management of her chronic illnesses. She is followed by hospice care. She is unable to participate in the hpi or ros. There are no nursing concerns at this time.    Past Medical History:  Diagnosis Date  . Allergy   . Anemia   . Anemia of chronic disease 03/14/2015  . 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 Bipolar 1 disorder (HCC)   . Cerebrovascular accident (HCC)   . Dementia   . GERD (gastroesophageal reflux disease)   . Hypertension   . Hyponatremia   . Schizoaffective disorder   . Thyroid disease     Past Surgical History:  Procedure Laterality Date  . APPENDECTOMY    . FRACTURE SURGERY      Social History   Social History  . Marital status: Married    Spouse name: N/A  . Number of children: N/A  . Years of education: N/A   Occupational History  . Not on file.   Social History Main Topics  . Smoking status: Former Smoker    Packs/day: 1.00    Types: Cigarettes  . Smokeless tobacco: Never Used  . Alcohol use No  . Drug use: No  . Sexual activity: Not on file   Other Topics Concern  . Not on file   Social History Narrative  . No narrative on file   No family history on file.    VITAL SIGNS BP (!) 145/75   Pulse 68    Temp (!) 97.5 F (36.4 C)   Resp 18   Ht 4\' 11"  (1.499 m)   Wt 69 lb (31.3 kg)   BMI 13.94 kg/m   Patient's Medications  New Prescriptions   No medications on file  Previous Medications   DIVALPROEX (DEPAKOTE SPRINKLE) 125 MG CAPSULE    Take 125 mg by mouth 3 (three) times daily. Take 125 mg three times daily   HYDROCODONE-ACETAMINOPHEN (NORCO) 7.5-325 MG TABLET    Take 1 tablet by mouth every 6 (six) hours.   LORAZEPAM (ATIVAN) 1 MG TABLET    Take 1 mg by mouth every 8 (eight) hours.    MAGNESIUM HYDROXIDE (MILK OF MAGNESIA) 400 MG/5ML SUSPENSION    Take 30 mLs by mouth daily as needed for mild constipation.   MORPHINE (ROXANOL) 20 MG/ML CONCENTRATED SOLUTION    Take 5 mg by mouth daily.    MULTIPLE VITAMINS-MINERALS (DECUBI-VITE PO)  Take 2 capsules by mouth daily. Reported on 11/27/2015   NUTRITIONAL SUPPLEMENTS (NUTRITIONAL SUPPLEMENT PO)    Take 120 mLs by mouth 4 (four) times daily. 2-cal    NYSTATIN (MYCOSTATIN/NYSTOP) 100000 UNIT/GM POWD    Apply topically as needed (PRN redness under breast).   PROTEIN POWD    2 scoops by mouth 3 times daily    RISPERIDONE (RISPERDAL) 0.5 MG TABLET    Take 0.5 mg by mouth at bedtime.    SKIN PROTECTANTS, MISC. (CALAZIME SKIN PROTECTANT EX)    Apply to buttocks every day and night shift for skin integrity  Modified Medications   No medications on file  Discontinued Medications   PROCHLORPERAZINE (COMPAZINE) 5 MG TABLET    Take 5 mg by mouth every 6 (six) hours as needed for nausea or vomiting.     SIGNIFICANT DIAGNOSTIC EXAMS   not a candidate for mammogram; dexa or colonoscopy  07-16-14: left forearm x-ray: no acute fracture  07-16-14: left humerus x-ray: no acute fracture    LABS REVIEWED:   11-21-15: wbc 8.3; hgb 11.1; hct 33.6; mcv 94.1 plt 442; glucose 102; bun 12; creat 0.40; k+ 3.1; na++ 139; total bili 1.5; albumin 2.7  01-16-16: right foot culture: providencia stuartii: cipro     Review of Systems Unable to perform ROS:  dementia     Physical Exam Constitutional: No distress. cathectic   Neck: Neck supple. No JVD present. No thyromegaly present.  Cardiovascular: Normal rate, normal heart sounds and intact distal pulses.   Heart rate slightly irregular  Pedal pulses not palpable   Respiratory: Effort normal and breath sounds normal. No respiratory distress.  GI: Soft. Bowel sounds are normal. She exhibits no distension. There is no tenderness.  Musculoskeletal: right lower extremity edema   Right upper extremity contracture especially in the wrist  Left upper extremity contracture Does not move lower extremities Has contracture in right lower extremity   Neurological: She is alert.  Skin: Skin is warm and dry. She is not diaphoretic. Right posterior hip: stage IV: 0.5 x 0.7 x 0.7 cm 100% granulation Sacrum stage IV: 2.2 x 0.3 x 0.3 cm 100% granulation    ASSESSMENT/ PLAN:   1. FTT/weight loss: will continue her supplements per the facility protocol. Her current weight is 69 pounds with  148 pounds in may 2016. . Will not make changes and will monitor her status.   2. Chronic pain: has contractures on  right  wrist/hand. Will continue vicodin 7.5/325 mg every 6 hours routinely and roxanol 5 mg daily and will monitor there are no indications of pain present.   3. Bipolar disorder: will continue her depakote sprinkles 125 mg three times daily    risperdal  and 0.5 mg nightly  Ativan 1 mg every  8 hours   4. Dementia: is end stage; is  not on medications; she is followed by hospice care; her weight is 69  pounds her status is end stage weight loss is an expected outcome at this stage of her disease process.   5. 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      MD is aware of resident's narcotic use and is in agreement with current plan of care. We will attempt to wean resident as apropriate   Synthia Innocenteborah Myrna Vonseggern NP [facility]    Contact [phone] Monday through Friday 8am- 5pm  After hours call [phone]

## 2016-11-27 ENCOUNTER — Non-Acute Institutional Stay (SKILLED_NURSING_FACILITY): Payer: Medicare Other | Admitting: Adult Health

## 2016-11-27 DIAGNOSIS — F319 Bipolar disorder, unspecified: Secondary | ICD-10-CM | POA: Diagnosis not present

## 2016-11-27 DIAGNOSIS — F0151 Vascular dementia with behavioral disturbance: Secondary | ICD-10-CM

## 2016-11-27 DIAGNOSIS — F01518 Vascular dementia, unspecified severity, with other behavioral disturbance: Secondary | ICD-10-CM | POA: Insufficient documentation

## 2016-11-27 DIAGNOSIS — R627 Adult failure to thrive: Secondary | ICD-10-CM

## 2016-11-27 HISTORY — DX: Vascular dementia, unspecified severity, with other behavioral disturbance: F01.518

## 2016-11-27 HISTORY — DX: Vascular dementia with behavioral disturbance: F01.51

## 2016-11-27 NOTE — Progress Notes (Signed)
Location:   [facility] Room Number: 227 Place of Service:  SNF (31)   CODE STATUS: dnr  Allergies  Allergen Reactions  . Demerol     Per MAR.    Marland Kitchen. Ampicillin     Per facility [facility].   Marland Kitchen. Aspirin     Per facility [facility].    Marland Kitchen. Codeine     Per MAR.    . Dilaudid [Hydromorphone Hcl]     Per MAR.   Marland Kitchen. Elavil [Amitriptyline]     Per MAR.   Marland Kitchen. Flexeril [Cyclobenzaprine Hcl]     Per MAR.    . Nsaids     [facility]    . Other     [facility]    Marland Kitchen. Penicillins     Per MAR.    . Tuberculin Tests     Chief Complaint  Patient presents with  . Acute Visit    staff concerns     HPI:  Staff is reporting to me today that she is screaming and yelling all night. Her behaviors are bothering other residents who complaining that they are no sleeping at night. She is unable to participate in the hpi or ros. She had been tried on nuedexta for possible pba; however this medication had to be stopped due to payer source issues.    Past Medical History:  Diagnosis Date  . Allergy   . Anemia   . Anemia of chronic disease 03/14/2015  . 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 Bipolar 1 disorder (HCC)   . Cerebrovascular accident (HCC)   . Dementia   . GERD (gastroesophageal reflux disease)   . Hypertension   . Hyponatremia   . Schizoaffective disorder   . Thyroid disease     Past Surgical History:  Procedure Laterality Date  . APPENDECTOMY    . FRACTURE SURGERY      Social History   Social History  . Marital status: Married    Spouse name: N/A  . Number of children: N/A  . Years of education: N/A   Occupational History  . Not on file.   Social History Main Topics  . Smoking status: Former Smoker    Packs/day: 1.00    Types: Cigarettes  . Smokeless tobacco: Never Used  . Alcohol use No  . Drug use: No  . Sexual activity: Not on file   Other Topics Concern  . Not on file   Social History Narrative  . No  narrative on file   No family history on file.    VITAL SIGNS BP (!) 145/68   Pulse 88   Ht 4\' 11"  (1.499 m)   Wt 69 lb (31.3 kg)   BMI 13.94 kg/m   Patient's Medications  New Prescriptions   No medications on file  Previous Medications   DIVALPROEX (DEPAKOTE SPRINKLE) 125 MG CAPSULE    Take 125 mg by mouth 2 (two) times daily. Take 125 mg in the AM and 375 at HS   HYDROCODONE-ACETAMINOPHEN (NORCO) 7.5-325 MG TABLET    Take 1 tablet by mouth every 6 (six) hours.   LORAZEPAM (ATIVAN) 1 MG TABLET    Take 1 mg by mouth every 8 (eight) hours.    MAGNESIUM HYDROXIDE (MILK OF MAGNESIA) 400 MG/5ML SUSPENSION    Take 30 mLs by mouth daily as needed for mild constipation.   MORPHINE (ROXANOL) 20 MG/ML CONCENTRATED SOLUTION    Take 5 mg by mouth daily.  MULTIPLE VITAMINS-MINERALS (DECUBI-VITE PO)    Take 2 capsules by mouth daily. Reported on 11/27/2015   NUTRITIONAL SUPPLEMENTS (NUTRITIONAL SUPPLEMENT PO)    Take 120 mLs by mouth 4 (four) times daily. 2-cal    NYSTATIN (MYCOSTATIN/NYSTOP) 100000 UNIT/GM POWD    Apply topically as needed (PRN redness under breast).   PROTEIN POWD    2 scoops by mouth 3 times daily    RISPERIDONE (RISPERDAL) 0.5 MG TABLET    Take 0.5 mg by mouth 2 (two) times daily.    SKIN PROTECTANTS, MISC. (CALAZIME SKIN PROTECTANT EX)    Apply to buttocks every day and night shift for skin integrity  Modified Medications   No medications on file  Discontinued Medications   No medications on file     SIGNIFICANT DIAGNOSTIC EXAMS  not a candidate for mammogram; dexa or colonoscopy  07-16-14: left forearm x-ray: no acute fracture  07-16-14: left humerus x-ray: no acute fracture    LABS REVIEWED:   11-21-15: wbc 8.3; hgb 11.1; hct 33.6; mcv 94.1 plt 442; glucose 102; bun 12; creat 0.40; k+ 3.1; na++ 139; total bili 1.5; albumin 2.7  01-16-16: right foot culture: providencia stuartii: cipro     Review of Systems Unable to perform ROS: dementia     Physical  Exam Constitutional: No distress. cathectic   Neck: Neck supple. No JVD present. No thyromegaly present.  Cardiovascular: Normal rate, normal heart sounds and intact distal pulses.   Heart rate slightly irregular  Pedal pulses not palpable   Respiratory: Effort normal and breath sounds normal. No respiratory distress.  GI: Soft. Bowel sounds are normal. She exhibits no distension. There is no tenderness.  Musculoskeletal: right lower extremity edema   Right upper extremity contracture especially in the wrist  Left upper extremity contracture Does not move lower extremities Has contracture in right lower extremity   Neurological: She is alert.  Skin: Skin is warm and dry. She is not diaphoretic. Right posterior hip: stage IV: 0.5 x 0.7 x 0.7 cm 100% granulation Sacrum stage IV: 2.2 x 0.3 x 0.3 cm 100% granulation    ASSESSMENT/ PLAN:  1. FTT/weight loss: will continue her supplements per the facility protocol. Her current weight is 69 pounds with  148 pounds in may 2016. . Will not make changes and will monitor her status.    2. Bipolar disorder: will continue her depakote sprinkles 125 mg in AM and 375 in the PM  Ativan 1 mg every  8 hours will increase her risperdal to 0.25 mg in the AM and 1 mg at hs and will monitor her status.   3. Dementia: is end stage; is  not on medications; she is followed by hospice care; her weight is 69  pounds her status is end stage weight loss is an expected outcome at this stage of her disease process.    MD is aware of resident's narcotic use and is in agreement with current plan of care. We will attempt to wean resident as apropriate   Synthia Innocent NP [facility]  Contact [phone] Monday through Friday 8am- 5pm  After hours call [phone]

## 2016-12-15 DIAGNOSIS — F0150 Vascular dementia without behavioral disturbance: Secondary | ICD-10-CM | POA: Insufficient documentation

## 2016-12-15 DIAGNOSIS — Z8673 Personal history of transient ischemic attack (TIA), and cerebral infarction without residual deficits: Secondary | ICD-10-CM | POA: Insufficient documentation

## 2016-12-17 ENCOUNTER — Other Ambulatory Visit: Payer: Self-pay

## 2016-12-17 MED ORDER — MORPHINE SULFATE (CONCENTRATE) 20 MG/ML PO SOLN: 5.0000 mg | Freq: Every day | ORAL | 0 refills | Status: DC

## 2016-12-17 NOTE — Telephone Encounter (Signed)
RX faxed to [facility] @ [phone], phone number [phone] 

## 2016-12-30 ENCOUNTER — Non-Acute Institutional Stay (SKILLED_NURSING_FACILITY): Payer: Medicare Other | Admitting: Adult Health

## 2016-12-30 ENCOUNTER — Encounter: Payer: Self-pay | Admitting: Adult Health

## 2016-12-30 ENCOUNTER — Other Ambulatory Visit: Payer: Self-pay

## 2016-12-30 DIAGNOSIS — F319 Bipolar disorder, unspecified: Secondary | ICD-10-CM | POA: Diagnosis not present

## 2016-12-30 DIAGNOSIS — R634 Abnormal weight loss: Secondary | ICD-10-CM | POA: Diagnosis not present

## 2016-12-30 DIAGNOSIS — F0150 Vascular dementia without behavioral disturbance: Secondary | ICD-10-CM

## 2016-12-30 DIAGNOSIS — R627 Adult failure to thrive: Secondary | ICD-10-CM

## 2016-12-30 MED ORDER — LORAZEPAM 1 MG PO TABS: 1.0000 mg | ORAL_TABLET | Freq: Three times a day (TID) | ORAL | 0 refills | Status: DC

## 2016-12-30 NOTE — Progress Notes (Signed)
Location:   [facility] Room Number: 227 B Place of Service:  SNF (31)   CODE STATUS: DNR  Allergies  Allergen Reactions  . Demerol     Per MAR.    Marland Kitchen Ampicillin     Per facility MAR.   Marland Kitchen Aspirin     Per facility MAR.    Marland Kitchen Codeine     Per MAR.    . Dilaudid [Hydromorphone Hcl]     Per MAR.   Marland Kitchen Elavil [Amitriptyline]     Per MAR.   Marland Kitchen Flexeril [Cyclobenzaprine Hcl]     Per MAR.    . Nsaids     Per facility [facility].    . Other     COX2 inhibitors. Per facility [facility].    Marland Kitchen Penicillins     Per MAR.    . Tuberculin Tests     Chief Complaint  Patient presents with  . Medical Management of Chronic Issues    1 month follow up    HPI:  She is 76 year old long term resident of this facility being seen for the management of her chronic illnesses: ftt; weight loss; bipolar disease and dementia. She is followed by hospice care. She does remain in bed all the time. There are no reports of increased pain; changes in appetite and no reports of fever. She is unable to participate in the hpi or ros. There are no nursing concerns at this time.    Past Medical History:  Diagnosis Date  . Allergy   . Anemia   . Anemia of chronic disease 03/14/2015  . 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 Bipolar 1 disorder (HCC)   . Cerebrovascular accident (HCC)   . Dementia   . GERD (gastroesophageal reflux disease)   . Hypertension   . Hyponatremia   . Schizoaffective disorder   . Thyroid disease     Past Surgical History:  Procedure Laterality Date  . APPENDECTOMY    . FRACTURE SURGERY      Social History   Social History  . Marital status: Married    Spouse name: N/A  . Number of children: N/A  . Years of education: N/A   Occupational History  . Not on file.   Social History Main Topics  . Smoking status: Former Smoker    Packs/day: 1.00    Types: Cigarettes  . Smokeless tobacco: Never Used  . Alcohol use No  . Drug use: No  . Sexual activity:  Not on file   Other Topics Concern  . Not on file   Social History Narrative  . No narrative on file   History reviewed. No pertinent family history.    VITAL SIGNS There were no vitals taken for this visit.  Patient resistant   Patient's Medications  New Prescriptions   No medications on file  Previous Medications   DIVALPROEX (DEPAKOTE SPRINKLE) 125 MG CAPSULE    Take 125 mg in the AM and 3 capsules (375 mg) at bedtime   HYDROCODONE-ACETAMINOPHEN (NORCO) 7.5-325 MG TABLET    Take 1 tablet by mouth every 6 (six) hours.   LORAZEPAM (ATIVAN) 1 MG TABLET    Take 1 tablet (1 mg total) by mouth every 8 (eight) hours.   MAGNESIUM HYDROXIDE (MILK OF MAGNESIA) 400 MG/5ML SUSPENSION    Take 30 mLs by mouth daily as needed for mild constipation.   MORPHINE (ROXANOL) 20 MG/ML CONCENTRATED SOLUTION    Take 0.25 mLs (5 mg total)  by mouth daily.   MULTIPLE VITAMINS-MINERALS (DECUBI-VITE PO)    Take 2 capsules by mouth daily. Reported on 11/27/2015   NUTRITIONAL SUPPLEMENTS (NUTRITIONAL SUPPLEMENT PO)    Take 120 mLs by mouth 4 (four) times daily. 2-cal    POLYETHYL GLYCOL-PROPYL GLYCOL (SYSTANE) 0.4-0.3 % SOLN    Place 1 drop into both eyes 2 (two) times daily.   PROTEIN POWD    2 scoops by mouth 3 times daily    RISPERIDONE (RISPERDAL) 0.5 MG TABLET    Take 0.5 mg by mouth every morning.    RISPERIDONE (RISPERDAL) 1 MG TABLET    Take 1 mg by mouth at bedtime.  Modified Medications   No medications on file  Discontinued Medications   SKIN PROTECTANTS, MISC. (CALAZIME SKIN PROTECTANT EX)    Apply to buttocks every day and night shift for skin integrity     SIGNIFICANT DIAGNOSTIC EXAMS  PREVIOUS   07-16-14: left forearm x-ray: no acute fracture  07-16-14: left humerus x-ray: no acute fracture   NO NEW EXAMS  LABS REVIEWED:   11-21-15: wbc 8.3; hgb 11.1; hct 33.6; mcv 94.1 plt 442; glucose 102; bun 12; creat 0.40; k+ 3.1; na++ 139; total bili 1.5; albumin 2.7  01-16-16: right foot  culture: providencia stuartii: cipro   NO NEW LABS   Review of Systems  Unable to perform ROS: Dementia (unable to answer questions )   Physical Exam  Constitutional: No distress.  cathectic  Eyes: Conjunctivae are normal.  Neck: Neck supple. No JVD present. No thyromegaly present.  Cardiovascular: Normal rate and regular rhythm.   Pedal pulses not palpable   Respiratory: Effort normal and breath sounds normal. No respiratory distress. She has no wheezes.  GI: Soft. Bowel sounds are normal. She exhibits no distension. There is no tenderness.  Musculoskeletal: She exhibits no edema.  Right upper extremity with contractures Left upper extremity with contracture Contracture right lower extremity   Lymphadenopathy:    She has no cervical adenopathy.  Neurological: She is alert.  Skin: Skin is warm and dry. She is not diaphoretic.  Sacrum stage IV: 110% granulation: 2.1 x 0. X 0.2 cm Right posterior hip stage IV; 25 % slough: 1.5x 1.4 x 0.6 cm   Psychiatric: She has a normal mood and affect.    ASSESSMENT/ PLAN:  TODAY:  1. FTT/weight loss:  Is slowly progressively worse will continue her supplements per the facility protocol. Her last known  weight is 69 pounds with  148 pounds in may 2016. . Will not make changes and will monitor her status.    2. Bipolar disorder: will continue her depakote sprinkles 125 mg in AM and 375 in the PM  Ativan 1 mg every  8 hours; risperdal to 0.25 mg in the AM and 1 mg at hs and will monitor her status.   3. Dementia: is end stage; is  not on medications; she is followed by hospice care; her weight is 69  pounds her status is end stage weight loss is an expected outcome at this stage of her disease process.     MD is aware of resident's narcotic use and is in agreement with current plan of care. We will attempt to wean resident as apropriate     Synthia Innocenteborah Pama Roskos NP [facility]  Contact [phone] Monday through Friday 8am- 5pm    After hours call [phone]

## 2016-12-30 NOTE — Telephone Encounter (Signed)
RX faxed to [facility] @ [phone], phone number [phone] 

## 2017-01-19 ENCOUNTER — Other Ambulatory Visit: Payer: Self-pay

## 2017-01-19 MED ORDER — HYDROCODONE-ACETAMINOPHEN 5-325 MG PO TABS: 1.0000 | ORAL_TABLET | Freq: Four times a day (QID) | ORAL | 0 refills | Status: DC

## 2017-01-19 NOTE — Telephone Encounter (Signed)
RX faxed to [facility] @ [phone], phone number [phone] 

## 2017-01-27 ENCOUNTER — Other Ambulatory Visit: Payer: Self-pay

## 2017-01-27 MED ORDER — MORPHINE SULFATE (CONCENTRATE) 20 MG/ML PO SOLN: 5.0000 mg | Freq: Every day | ORAL | 0 refills | Status: AC

## 2017-01-27 NOTE — Telephone Encounter (Signed)
RX faxed to [facility] @ [phone], phone number [phone] 

## 2017-01-28 ENCOUNTER — Non-Acute Institutional Stay (SKILLED_NURSING_FACILITY): Payer: Medicare Other | Admitting: Adult Health

## 2017-01-28 ENCOUNTER — Encounter: Payer: Self-pay | Admitting: Adult Health

## 2017-01-28 DIAGNOSIS — I6389 Other cerebral infarction: Secondary | ICD-10-CM

## 2017-01-28 DIAGNOSIS — I638 Other cerebral infarction: Secondary | ICD-10-CM

## 2017-01-28 DIAGNOSIS — I69354 Hemiplegia and hemiparesis following cerebral infarction affecting left non-dominant side: Secondary | ICD-10-CM

## 2017-01-28 DIAGNOSIS — L8994 Pressure ulcer of unspecified site, stage 4: Secondary | ICD-10-CM

## 2017-01-28 DIAGNOSIS — G894 Chronic pain syndrome: Secondary | ICD-10-CM | POA: Diagnosis not present

## 2017-01-28 DIAGNOSIS — F319 Bipolar disorder, unspecified: Secondary | ICD-10-CM

## 2017-01-28 NOTE — Progress Notes (Signed)
Location:   [facility] Room Number: 227 B Place of Service:  SNF (31)   CODE STATUS: DNR  Allergies  Allergen Reactions  . Demerol     Per MAR.    Marland Kitchen. Ampicillin     Per facility [facility].   Marland Kitchen. Aspirin     Per facility [facility].    Marland Kitchen. Codeine     Per MAR.    . Dilaudid [Hydromorphone Hcl]     Per MAR.   Marland Kitchen. Elavil [Amitriptyline]     Per MAR.   Marland Kitchen. Flexeril [Cyclobenzaprine Hcl]     Per MAR.    . [facility]     [facility]    . Other     [facility]    Marland Kitchen. Penicillins     Per MAR.    . Tuberculin Tests     Chief Complaint  Patient presents with  . Medical Management of Chronic Issues    ftt/weight loss; bipolar disorder; dementia; cva; wounds and chronic pain     HPI:  She is a 76 year old long term resident of this facility being seen for the management of her chronic illnesses: ftt/weight loss; bipolar disorder; dementia; cva; wounds and chronic pain. She continues to be followed by hospice care. She is unable to participate in the hpi or ros. There are no reports of behavior issues; uncontrolled pain; or changes in appetite. There are no nursing concerns at this time.    Past Medical History:  Diagnosis Date  . Allergy   . Anemia   . Anemia of chronic disease 03/14/2015  . 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 Bipolar 1 disorder (HCC)   . Cerebrovascular accident (HCC)   . Dementia   . GERD (gastroesophageal reflux disease)   . Hypertension   . Hyponatremia   . Schizoaffective disorder   . Thyroid disease     Past Surgical History:  Procedure Laterality Date  . APPENDECTOMY    . FRACTURE SURGERY      Social History   Social History  . Marital status: Married    Spouse name: N/A  . Number of children: N/A  . Years of education: N/A   Occupational History  . Not on file.   Social History Main Topics  . Smoking status: Former Smoker    Packs/day: 1.00    Types: Cigarettes  . Smokeless tobacco: Never  Used  . Alcohol use No  . Drug use: No  . Sexual activity: Not on file   Other Topics Concern  . Not on file   Social History Narrative  . No narrative on file   History reviewed. No pertinent family history.    VITAL SIGNS BP 110/70   Pulse 80   Temp 97.9 F (36.6 C)   Resp 16   Ht 4\' 11"  (1.499 m)   SpO2 94%   Patient's Medications  New Prescriptions   No medications on file  Previous Medications   DIVALPROEX (DEPAKOTE SPRINKLE) 125 MG CAPSULE    Take 125 mg in the AM and 3 capsules (375 mg) at bedtime   HYDROCODONE-ACETAMINOPHEN (NORCO/VICODIN) 5-325 MG TABLET    Take 1 tablet by mouth every 6 (six) hours.   LORAZEPAM (ATIVAN) 1 MG TABLET    Take 1 tablet (1 mg total) by mouth every 8 (eight) hours.   MAGNESIUM HYDROXIDE (MILK OF MAGNESIA) 400 MG/5ML SUSPENSION    Take 30 mLs by mouth daily as needed  for mild constipation.   MORPHINE (ROXANOL) 20 MG/ML CONCENTRATED SOLUTION    Give 0.9ml by mouth every 4 hours as needed   MORPHINE (ROXANOL) 20 MG/ML CONCENTRATED SOLUTION    Take 0.25 mLs (5 mg total) by mouth daily.   MULTIPLE VITAMINS-MINERALS (DECUBI-VITE PO)    Take 2 capsules by mouth daily. Reported on 11/27/2015   NUTRITIONAL SUPPLEMENTS (NUTRITIONAL SUPPLEMENT PO)    Take 120 mLs by mouth 4 (four) times daily. 2-cal    POLYETHYL GLYCOL-PROPYL GLYCOL (SYSTANE) 0.4-0.3 % SOLN    Place 1 drop into both eyes 2 (two) times daily.   RISPERIDONE (RISPERDAL) 0.5 MG TABLET    Take 0.5 mg by mouth every morning.    RISPERIDONE (RISPERDAL) 1 MG TABLET    Take 1 mg by mouth at bedtime.  Modified Medications   No medications on file  Discontinued Medications   HYDROCODONE-ACETAMINOPHEN (NORCO) 7.5-325 MG TABLET    Take 1 tablet by mouth every 6 (six) hours as needed.      SIGNIFICANT DIAGNOSTIC EXAMS  PREVIOUS   07-16-14: left forearm x-ray: no acute fracture  07-16-14: left humerus x-ray: no acute fracture   NO NEW EXAMS  LABS REVIEWED:   11-21-15: wbc 8.3; hgb  11.1; hct 33.6; mcv 94.1 plt 442; glucose 102; bun 12; creat 0.40; k+ 3.1; na++ 139; total bili 1.5; albumin 2.7  01-16-16: right foot culture: providencia stuartii: cipro   NO NEW LABS   Review of Systems  Unable to perform ROS: Dementia (unable to answer questions )   Physical Exam  Constitutional: No distress.  cathectic  Eyes: Conjunctivae are normal.  Neck: Neck supple. No JVD present. No thyromegaly present.  Cardiovascular: Normal rate and regular rhythm.   Pedal pulses not palpable   Respiratory: Effort normal and breath sounds normal. No respiratory distress. She has no wheezes.  GI: Soft. Bowel sounds are normal. She exhibits no distension. There is no tenderness.  Musculoskeletal: She exhibits no edema.  Right upper extremity with contractures Left upper extremity with contracture Contracture right lower extremity   Lymphadenopathy:    She has no cervical adenopathy.  Neurological: She is alert.  Skin: Skin is warm and dry. She is not diaphoretic.  Sacrum stage IV: 40% slough: 2.1 x 1.1 x 0.2 cm Right posterior hip stage IV; 25 % slough: 1.1 x 1.2 x 1.2 cm  Psychiatric: She has a normal mood and affect.    ASSESSMENT/ PLAN:  TODAY:  1. FTT/weight loss:  Is slowly progressively worse will continue her supplements per the facility protocol. Her last known  weight is 69 pounds with  148 pounds in may 2016. . Will not make changes and will monitor her status.    2. Bipolar disorder: will continue her depakote sprinkles 125 mg in AM and 375 in the PM  Ativan 1 mg every  8 hours; risperdal to 0.25 mg in the AM and 1 mg at hs and will monitor her status.   3. Dementia: is end stage; is  not on medications; she is followed by hospice care; her weight is 69  pounds her status is end stage weight loss is an expected outcome at this stage of her disease process.   4. CVA; has left hemiparesis: is without change in status: she is currently not on medications; will monitor   5.  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 Stann Mainland continue to monitor  her status  6. Chronic pain: is controlled: is taking vicodin 5/325 mg every 6 hours and has roxanol 5 mg daily for wound care and every 4 hours as needed ,    MD is aware of resident's narcotic use and is in agreement with current plan of care. We will attempt to wean resident as apropriate     Synthia Innocent NP [facility]  Contact [phone] Monday through Friday 8am- 5pm  After hours call [phone]

## 2017-02-02 ENCOUNTER — Other Ambulatory Visit: Payer: Self-pay

## 2017-02-02 MED ORDER — LORAZEPAM 1 MG PO TABS: 1.0000 mg | ORAL_TABLET | Freq: Three times a day (TID) | ORAL | 0 refills | Status: DC

## 2017-02-02 NOTE — Telephone Encounter (Signed)
RX faxed to [facility] @ [phone], phone number [phone] 

## 2017-02-03 ENCOUNTER — Non-Acute Institutional Stay (SKILLED_NURSING_FACILITY): Payer: Medicare Other | Admitting: Adult Health

## 2017-02-03 DIAGNOSIS — I69354 Hemiplegia and hemiparesis following cerebral infarction affecting left non-dominant side: Secondary | ICD-10-CM

## 2017-02-03 DIAGNOSIS — R627 Adult failure to thrive: Secondary | ICD-10-CM | POA: Diagnosis not present

## 2017-02-03 DIAGNOSIS — I638 Other cerebral infarction: Secondary | ICD-10-CM

## 2017-02-03 DIAGNOSIS — I6389 Other cerebral infarction: Secondary | ICD-10-CM

## 2017-02-03 DIAGNOSIS — F0150 Vascular dementia without behavioral disturbance: Secondary | ICD-10-CM

## 2017-02-13 NOTE — Progress Notes (Signed)
Location:      Place of Service:  SNF (31)   CODE STATUS: DNR  Allergies  Allergen Reactions  . Demerol     Per MAR.    Marland Kitchen Ampicillin     Per facility [facility].   Marland Kitchen Aspirin     Per facility [facility].    Marland Kitchen Codeine     Per MAR.    . Dilaudid [Hydromorphone Hcl]     Per MAR.   Marland Kitchen Elavil [Amitriptyline]     Per MAR.   Marland Kitchen Flexeril [Cyclobenzaprine Hcl]     Per MAR.    . Nsaids     Per facility [facility].    . Other     COX2 inhibitors. Per facility [facility].    Marland Kitchen Penicillins     Per MAR.    . Tuberculin Tests     Chief Complaint  Patient presents with  . Acute Visit    care plan meeting     HPI:  We have come together for her quarterly care plan meeting. She continues to be followed by hospice care she does continue to decline on a global basis. She has multiple contractures. She is unable to participate in the hpi or ros. She continues to lose weight; although she does have a good appetite. We did discuss with her family that her caloric needs are great due to her wounds and her poor absorption. Her family did verbalize understanding. We did discuss her code status; her family desires to maintain DNR and comfort care only. At this time there are no nursing concerns.   Past Medical History:  Diagnosis Date  . Allergy   . Anemia   . Anemia of chronic disease 03/14/2015  . 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 Bipolar 1 disorder (HCC)   . Cerebrovascular accident (HCC)   . Dementia   . GERD (gastroesophageal reflux disease)   . Hypertension   . Hyponatremia   . Schizoaffective disorder   . Thyroid disease   . 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 APPENDECTOMY    . FRACTURE SURGERY      Social History   Social History  . Marital status: Married    Spouse name: N/A  . Number of children: N/A  . Years of education: N/A   Occupational History  . Not on file.   Social History Main  Topics  . Smoking status: Former Smoker    Packs/day: 1.00    Types: Cigarettes  . Smokeless tobacco: Never Used  . Alcohol use No  . Drug use: No  . Sexual activity: Not on file   Other Topics Concern  . Not on file   Social History Narrative  . No narrative on file   No family history on file.    VITAL SIGNS BP 105/68   Pulse 64   Resp 12   Ht  (1.499 m)   Wt 69 lb (31.3 kg)   BMI 13.94 kg/m    Patient's Medications  New Prescriptions   No medications on file  Previous Medications   DIVALPROEX (DEPAKOTE SPRINKLE) 125 MG CAPSULE    Take 125 mg in the AM and 3 capsules (375 mg) at bedtime   HYDROCODONE-ACETAMINOPHEN (NORCO/VICODIN) 5-325 MG TABLET    Take 1 tablet by mouth every 6 (six) hours.   LORAZEPAM (ATIVAN) 1 MG TABLET    Take 1 tablet (1 mg total)  by mouth every 8 (eight) hours.   MAGNESIUM HYDROXIDE (MILK OF MAGNESIA) 400 MG/5ML SUSPENSION    Take 30 mLs by mouth daily as needed for mild constipation.   MORPHINE (ROXANOL) 20 MG/ML CONCENTRATED SOLUTION    Give 0.44ml by mouth every 4 hours as needed   MORPHINE (ROXANOL) 20 MG/ML CONCENTRATED SOLUTION    Take 0.25 mLs (5 mg total) by mouth daily.   MULTIPLE VITAMINS-MINERALS (DECUBI-VITE PO)    Take 2 capsules by mouth daily. Reported on 11/27/2015   NUTRITIONAL SUPPLEMENTS (NUTRITIONAL SUPPLEMENT PO)    Take 120 mLs by mouth 4 (four) times daily. 2-cal    POLYETHYL GLYCOL-PROPYL GLYCOL (SYSTANE) 0.4-0.3 % SOLN    Place 1 drop into both eyes 2 (two) times daily.   RISPERIDONE (RISPERDAL) 0.5 MG TABLET    Take 0.5 mg by mouth every morning.    RISPERIDONE (RISPERDAL) 1 MG TABLET    Take 1 mg by mouth at bedtime.  Modified Medications   No medications on file  Discontinued Medications   No medications on file     SIGNIFICANT DIAGNOSTIC EXAMS  PREVIOUS   07-16-14: left forearm x-ray: no acute fracture  07-16-14: left humerus x-ray: no acute fracture   NO NEW EXAMS  LABS REVIEWED:   11-21-15: wbc  8.3; hgb 11.1; hct 33.6; mcv 94.1 plt 442; glucose 102; bun 12; creat 0.40; k+ 3.1; na++ 139; total bili 1.5; albumin 2.7  01-16-16: right foot culture: providencia stuartii: cipro   NO NEW LABS   Review of Systems  Unable to perform ROS: Dementia (unable to answer questions )    Physical Exam  Constitutional: No distress.  cachexia   Eyes: Conjunctivae are normal.  Neck: Neck supple. No thyromegaly present.  Cardiovascular: Normal rate, regular rhythm and normal heart sounds.   Unable to palpate pedal pulses   Pulmonary/Chest: Effort normal and breath sounds normal. No respiratory distress.  Abdominal: Soft. Bowel sounds are normal. She exhibits no distension. There is no tenderness.  Musculoskeletal: She exhibits no edema.  Right upper extremity with contractures Left upper extremity with contracture Contracture right lower extremity    Lymphadenopathy:    She has no cervical adenopathy.  Neurological: She is alert.  Skin: Skin is warm and dry. She is not diaphoretic.  Sacrum stage IV: 40% slough: 2.1 x 1.1 x 0.2 cm Right posterior hip stage IV; 25 % slough: 1.1 x 1.2 x 1.2 cm     ASSESSMENT/ PLAN:  TODAY   1. CVA with left side hemiparesis 2. Vascular dementia 3. Failure to thrive.   At this time will not make any changes in her medications; or plan of care.   Time spent with patient and family >40 minutes discussing; her status; prognosis; and decline in her condition. We did discuss her end of life desires. Her family hs verbalized understanding    MD is aware of resident's narcotic use and is in agreement with current plan of care. We will attempt to wean resident as apropriate   Synthia Innocent NP [facility]  Contact [phone] Monday through Friday 8am- 5pm  After hours call [phone]

## 2017-02-15 ENCOUNTER — Other Ambulatory Visit: Payer: Self-pay

## 2017-02-15 MED ORDER — HYDROCODONE-ACETAMINOPHEN 5-325 MG PO TABS: 1.0000 | ORAL_TABLET | Freq: Four times a day (QID) | ORAL | 0 refills | Status: DC

## 2017-02-15 NOTE — Telephone Encounter (Signed)
RX faxed to [facility] @ [phone], phone number [phone] 

## 2017-02-25 ENCOUNTER — Encounter: Payer: Self-pay | Admitting: Adult Health

## 2017-02-25 ENCOUNTER — Non-Acute Institutional Stay (SKILLED_NURSING_FACILITY): Payer: Medicare Other | Admitting: Adult Health

## 2017-02-25 DIAGNOSIS — R627 Adult failure to thrive: Secondary | ICD-10-CM

## 2017-02-25 DIAGNOSIS — I6389 Other cerebral infarction: Secondary | ICD-10-CM

## 2017-02-25 DIAGNOSIS — F0150 Vascular dementia without behavioral disturbance: Secondary | ICD-10-CM | POA: Diagnosis not present

## 2017-02-25 DIAGNOSIS — I69354 Hemiplegia and hemiparesis following cerebral infarction affecting left non-dominant side: Secondary | ICD-10-CM | POA: Diagnosis not present

## 2017-02-25 NOTE — Progress Notes (Signed)
Location:   [facility] Room Number: 227 B Place of Service:  SNF (31)   CODE STATUS: DNR  Allergies  Allergen Reactions  . Demerol     Per MAR.    Marland Kitchen Ampicillin     Per facility [facility].   Marland Kitchen Aspirin     Per facility [facility].    Marland Kitchen Codeine     Per MAR.    . Dilaudid [Hydromorphone Hcl]     Per MAR.   Marland Kitchen Elavil [Amitriptyline]     Per MAR.   Marland Kitchen Flexeril [Cyclobenzaprine Hcl]     Per MAR.    . Nsaids     Per facility [facility].    . Other     COX2 inhibitors. Per facility [facility].    Marland Kitchen Penicillins     Per MAR.    . Tuberculin Tests     Chief Complaint  Patient presents with  . Acute Visit    End of Life    HPI:  She has had a decline in her status. She continues to be followed by hospice care. Her family is at bedside. She does not respond to verbal stimulation. Her medications except roxanol and atropine drops. Her 02 sats are in the 70-80 range; there are no signs or indications of respiratory distress. There are no indications of pain present. She is blowing off CO2. At this time there are no nursing concerns. She did have a fever yesterday and was treated with tylenol suppository.   Past Medical History:  Diagnosis Date  . Allergy   . Anemia   . Anemia of chronic disease 03/14/2015  . 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 Bipolar 1 disorder (HCC)   . Cerebrovascular accident (HCC)   . Dementia   . GERD (gastroesophageal reflux disease)   . Hypertension   . Hyponatremia   . Schizoaffective disorder   . Thyroid disease   . 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 APPENDECTOMY    . FRACTURE SURGERY      Social History   Social History  . Marital status: Married    Spouse name: N/A  . Number of children: N/A  . Years of education: N/A   Occupational History  . Not on file.   Social History Main Topics  . Smoking status: Former Smoker    Packs/day: 1.00    Types:  Cigarettes  . Smokeless tobacco: Never Used  . Alcohol use No  . Drug use: No  . Sexual activity: Not on file   Other Topics Concern  . Not on file   Social History Narrative  . No narrative on file   History reviewed. No pertinent family history.    VITAL SIGNS BP (!) 142/72   Pulse (!) 108   Temp (!) 103.3 F (39.6 C)   Resp (!) 42   Ht  (1.499 m)   Wt 69 lb (31.3 kg)   SpO2 (!) 76% Comment: room air  BMI 13.94 kg/m   Patient's Medications  New Prescriptions   No medications on file  Previous Medications   ATROPINE SULFATE PO    Atropine sulfate solution 1% - Give 2 drop sublingually every 4 hours as needed for increase secretions   MORPHINE (ROXANOL) 20 MG/ML CONCENTRATED SOLUTION    Give 0.11ml by mouth every 4 hours as needed   MORPHINE (ROXANOL) 20 MG/ML CONCENTRATED SOLUTION    Take  0.25 mLs (5 mg total) by mouth daily.   NUTRITIONAL SUPPLEMENTS (NUTRITIONAL SUPPLEMENT PO)    Take 120 mLs by mouth 4 (four) times daily. 2-cal    WOUND DRESSINGS GEL    [employer] - Apply to sacrum wound topically every 2 days (day shift)  Modified Medications   No medications on file  Discontinued Medications   DIVALPROEX (DEPAKOTE SPRINKLE) 125 MG CAPSULE    Take 125 mg in the AM and 3 capsules (375 mg) at bedtime   HYDROCODONE-ACETAMINOPHEN (NORCO/VICODIN) 5-325 MG TABLET    Take 1 tablet by mouth every 6 (six) hours.   LORAZEPAM (ATIVAN) 1 MG TABLET    Take 1 tablet (1 mg total) by mouth every 8 (eight) hours.   MAGNESIUM HYDROXIDE (MILK OF MAGNESIA) 400 MG/5ML SUSPENSION    Take 30 mLs by mouth daily as needed for mild constipation.   MULTIPLE VITAMINS-MINERALS (DECUBI-VITE PO)    Take 2 capsules by mouth daily. Reported on 11/27/2015   POLYETHYL GLYCOL-PROPYL GLYCOL (SYSTANE) 0.4-0.3 % SOLN    Place 1 drop into both eyes 2 (two) times daily.   RISPERIDONE (RISPERDAL) 0.5 MG TABLET    Take 0.5 mg by mouth every morning.    RISPERIDONE (RISPERDAL) 1 MG TABLET    Take 1 mg by  mouth at bedtime.     SIGNIFICANT DIAGNOSTIC EXAMS  PREVIOUS   07-16-14: left forearm x-ray: no acute fracture  07-16-14: left humerus x-ray: no acute fracture   NO NEW EXAMS  LABS REVIEWED:   11-21-15: wbc 8.3; hgb 11.1; hct 33.6; mcv 94.1 plt 442; glucose 102; bun 12; creat 0.40; k+ 3.1; na++ 139; total bili 1.5; albumin 2.7  01-16-16: right foot culture: providencia stuartii: cipro   NO NEW LABS   Review of Systems  Unable to perform ROS: Dementia (not verbally responsive )    Physical Exam  Constitutional: No distress.  cachexia  Neck: Neck supple.  Cardiovascular: Normal rate, regular rhythm and normal heart sounds.   Unable to palpate pedal pulses   Pulmonary/Chest: Breath sounds normal.  Increased respiratory rate   Abdominal: Soft. Bowel sounds are normal.  Musculoskeletal:  Right upper extremity with contractures Left upper extremity with contracture Contracture right lower extremity      Lymphadenopathy:    She has no cervical adenopathy.  Neurological:  Not aware of surroundings   Skin: Skin is warm and dry. She is not diaphoretic. There is pallor.  Sacrum stage IV: 40% slough: 2.1 x 1.1 x 0.2 cm Right posterior hip stage IV; 25 % slough: 1.1 x 1.2 x 1.2 cm        ASSESSMENT/ PLAN:  TODAY   1. CVA with left side hemiparesis 2. Vascular dementia 3. Failure to thrive.   At this time she appears to be comfortable Will not make changes and will continue to monitor her status will make further changes as indicated.   MD is aware of resident's narcotic use and is in agreement with current plan of care. We will attempt to wean resident as apropriate     Synthia Innocent NP [facility]  Contact [phone] Monday through Friday 8am- 5pm  After hours call [phone]

## 2017-03-18 DEATH — deceased
